# Patient Record
Sex: Male | Born: 1975
Health system: Southern US, Community
[De-identification: ages and names within clinical notes are randomized; demographics above are authoritative.]

## PROBLEM LIST (undated history)

## (undated) DIAGNOSIS — F32A Depression, unspecified: Secondary | ICD-10-CM

## (undated) DIAGNOSIS — E785 Hyperlipidemia, unspecified: Secondary | ICD-10-CM

## (undated) DIAGNOSIS — K219 Gastro-esophageal reflux disease without esophagitis: Secondary | ICD-10-CM

## (undated) DIAGNOSIS — G4733 Obstructive sleep apnea (adult) (pediatric): Secondary | ICD-10-CM

## (undated) DIAGNOSIS — F329 Major depressive disorder, single episode, unspecified: Secondary | ICD-10-CM

## (undated) DIAGNOSIS — R42 Dizziness and giddiness: Secondary | ICD-10-CM

## (undated) DIAGNOSIS — E1169 Type 2 diabetes mellitus with other specified complication: Secondary | ICD-10-CM

## (undated) DIAGNOSIS — I2510 Atherosclerotic heart disease of native coronary artery without angina pectoris: Secondary | ICD-10-CM

## (undated) DIAGNOSIS — G43909 Migraine, unspecified, not intractable, without status migrainosus: Secondary | ICD-10-CM

## (undated) DIAGNOSIS — E669 Obesity, unspecified: Secondary | ICD-10-CM

## (undated) DIAGNOSIS — G4730 Sleep apnea, unspecified: Secondary | ICD-10-CM

## (undated) DIAGNOSIS — F419 Anxiety disorder, unspecified: Secondary | ICD-10-CM

## (undated) HISTORY — DX: Major depressive disorder, single episode, unspecified: F32.9

## (undated) HISTORY — DX: Depression, unspecified: F32.A

## (undated) HISTORY — PX: COLONOSCOPY: SHX174

## (undated) HISTORY — DX: Anxiety disorder, unspecified: F41.9

## (undated) HISTORY — DX: Atherosclerotic heart disease of native coronary artery without angina pectoris: I25.10

## (undated) HISTORY — DX: Dizziness and giddiness: R42

## (undated) HISTORY — DX: Gastro-esophageal reflux disease without esophagitis: K21.9

## (undated) HISTORY — DX: Obstructive sleep apnea (adult) (pediatric): G47.33

## (undated) HISTORY — DX: Hyperlipidemia, unspecified: E78.5

## (undated) HISTORY — PX: FINGER SURGERY: SHX640

## (undated) HISTORY — DX: Sleep apnea, unspecified: G47.30

---

## 2009-01-01 ENCOUNTER — Ambulatory Visit: Payer: Self-pay | Admitting: Internal Medicine

## 2009-01-01 DIAGNOSIS — E785 Hyperlipidemia, unspecified: Secondary | ICD-10-CM | POA: Insufficient documentation

## 2009-01-01 DIAGNOSIS — K219 Gastro-esophageal reflux disease without esophagitis: Secondary | ICD-10-CM | POA: Insufficient documentation

## 2009-01-01 LAB — CONVERTED CEMR LAB
ALT: 28 units/L (ref 0–53)
AST: 22 units/L (ref 0–37)
Albumin: 4 g/dL (ref 3.5–5.2)
Alkaline Phosphatase: 72 units/L (ref 39–117)
BUN: 12 mg/dL (ref 6–23)
Bilirubin, Direct: 0.1 mg/dL (ref 0.0–0.3)
CO2: 31 meq/L (ref 19–32)
Calcium: 9.1 mg/dL (ref 8.4–10.5)
Chloride: 106 meq/L (ref 96–112)
Cholesterol, target level: 200 mg/dL
Cholesterol: 229 mg/dL — ABNORMAL HIGH (ref 0–200)
Creatinine, Ser: 1.3 mg/dL (ref 0.4–1.5)
Direct LDL: 148.9 mg/dL
GFR calc non Af Amer: 81.39 mL/min (ref >60)
Glucose, Bld: 90 mg/dL (ref 70–99)
HDL goal, serum: 40 mg/dL
HDL: 46.4 mg/dL (ref >39.00)
LDL Goal: 160 mg/dL
Potassium: 4.4 meq/L (ref 3.5–5.1)
Sodium: 142 meq/L (ref 135–145)
TSH: 0.97 microintl units/mL (ref 0.35–5.50)
Total Bilirubin: 0.7 mg/dL (ref 0.3–1.2)
Total CHOL/HDL Ratio: 5
Total Protein: 7.8 g/dL (ref 6.0–8.3)
Triglycerides: 143 mg/dL (ref 0.0–149.0)
VLDL: 28.6 mg/dL (ref 0.0–40.0)

## 2009-03-30 ENCOUNTER — Ambulatory Visit: Payer: Self-pay | Admitting: Internal Medicine

## 2009-03-30 DIAGNOSIS — M79609 Pain in unspecified limb: Secondary | ICD-10-CM | POA: Insufficient documentation

## 2010-05-13 ENCOUNTER — Ambulatory Visit: Payer: Self-pay | Admitting: Internal Medicine

## 2010-05-13 DIAGNOSIS — R5383 Other fatigue: Secondary | ICD-10-CM

## 2010-05-13 DIAGNOSIS — L738 Other specified follicular disorders: Secondary | ICD-10-CM | POA: Insufficient documentation

## 2010-05-13 DIAGNOSIS — G4733 Obstructive sleep apnea (adult) (pediatric): Secondary | ICD-10-CM | POA: Insufficient documentation

## 2010-05-13 DIAGNOSIS — R5381 Other malaise: Secondary | ICD-10-CM | POA: Insufficient documentation

## 2010-05-13 LAB — CONVERTED CEMR LAB
ALT: 54 units/L — ABNORMAL HIGH (ref 0–53)
AST: 36 units/L (ref 0–37)
Albumin: 4.3 g/dL (ref 3.5–5.2)
Alkaline Phosphatase: 58 units/L (ref 39–117)
BUN: 17 mg/dL (ref 6–23)
Basophils Absolute: 0 10*3/uL (ref 0.0–0.1)
Basophils Relative: 0.5 % (ref 0.0–3.0)
Bilirubin, Direct: 0.1 mg/dL (ref 0.0–0.3)
CO2: 29 meq/L (ref 19–32)
Calcium: 9.4 mg/dL (ref 8.4–10.5)
Chloride: 103 meq/L (ref 96–112)
Cholesterol: 254 mg/dL — ABNORMAL HIGH (ref 0–200)
Creatinine, Ser: 1.2 mg/dL (ref 0.4–1.5)
Direct LDL: 150.7 mg/dL
Eosinophils Absolute: 0.1 10*3/uL (ref 0.0–0.7)
Eosinophils Relative: 1 % (ref 0.0–5.0)
GFR calc non Af Amer: 85.27 mL/min (ref >60)
Glucose, Bld: 87 mg/dL (ref 70–99)
HCT: 43.3 % (ref 39.0–52.0)
HDL: 47.2 mg/dL (ref >39.00)
Hemoglobin: 15 g/dL (ref 13.0–17.0)
Lymphocytes Relative: 33.2 % (ref 12.0–46.0)
Lymphs Abs: 1.7 10*3/uL (ref 0.7–4.0)
MCHC: 34.6 g/dL (ref 30.0–36.0)
MCV: 86.9 fL (ref 78.0–100.0)
Monocytes Absolute: 0.4 10*3/uL (ref 0.1–1.0)
Monocytes Relative: 7.5 % (ref 3.0–12.0)
Neutro Abs: 2.9 10*3/uL (ref 1.4–7.7)
Neutrophils Relative %: 57.8 % (ref 43.0–77.0)
Platelets: 255 10*3/uL (ref 150.0–400.0)
Potassium: 4.4 meq/L (ref 3.5–5.1)
RBC: 4.99 M/uL (ref 4.22–5.81)
RDW: 13.3 % (ref 11.5–14.6)
Sodium: 140 meq/L (ref 135–145)
TSH: 1.55 microintl units/mL (ref 0.35–5.50)
Total Bilirubin: 0.6 mg/dL (ref 0.3–1.2)
Total CHOL/HDL Ratio: 5
Total Protein: 7.7 g/dL (ref 6.0–8.3)
Triglycerides: 140 mg/dL (ref 0.0–149.0)
VLDL: 28 mg/dL (ref 0.0–40.0)
WBC: 5 10*3/uL (ref 4.5–10.5)

## 2010-05-14 ENCOUNTER — Encounter: Payer: Self-pay | Admitting: Internal Medicine

## 2010-05-14 ENCOUNTER — Encounter (INDEPENDENT_AMBULATORY_CARE_PROVIDER_SITE_OTHER): Payer: Self-pay | Admitting: *Deleted

## 2010-06-03 ENCOUNTER — Ambulatory Visit: Payer: Self-pay | Admitting: Pulmonary Disease

## 2010-06-03 DIAGNOSIS — R002 Palpitations: Secondary | ICD-10-CM | POA: Insufficient documentation

## 2010-06-25 ENCOUNTER — Telehealth: Payer: Self-pay | Admitting: Pulmonary Disease

## 2010-06-26 ENCOUNTER — Encounter: Payer: Self-pay | Admitting: Pulmonary Disease

## 2010-06-27 ENCOUNTER — Encounter: Payer: Self-pay | Admitting: Internal Medicine

## 2010-06-27 ENCOUNTER — Ambulatory Visit
Admission: RE | Admit: 2010-06-27 | Discharge: 2010-06-27 | Payer: Self-pay | Source: Home / Self Care | Attending: Pulmonary Disease | Admitting: Pulmonary Disease

## 2010-06-27 ENCOUNTER — Ambulatory Visit
Admission: RE | Admit: 2010-06-27 | Discharge: 2010-06-27 | Payer: Self-pay | Source: Home / Self Care | Attending: Internal Medicine | Admitting: Internal Medicine

## 2010-06-27 DIAGNOSIS — S6990XA Unspecified injury of unspecified wrist, hand and finger(s), initial encounter: Secondary | ICD-10-CM | POA: Insufficient documentation

## 2010-06-27 DIAGNOSIS — S59909A Unspecified injury of unspecified elbow, initial encounter: Secondary | ICD-10-CM | POA: Insufficient documentation

## 2010-06-27 DIAGNOSIS — S63509A Unspecified sprain of unspecified wrist, initial encounter: Secondary | ICD-10-CM | POA: Insufficient documentation

## 2010-06-27 DIAGNOSIS — S59919A Unspecified injury of unspecified forearm, initial encounter: Secondary | ICD-10-CM

## 2010-06-27 DIAGNOSIS — J020 Streptococcal pharyngitis: Secondary | ICD-10-CM | POA: Insufficient documentation

## 2010-07-04 ENCOUNTER — Ambulatory Visit
Admission: RE | Admit: 2010-07-04 | Discharge: 2010-07-04 | Payer: Self-pay | Source: Home / Self Care | Attending: Pulmonary Disease | Admitting: Pulmonary Disease

## 2010-07-23 NOTE — Assessment & Plan Note (Signed)
Summary: NEW / Howard Hays /$50 Natale Milch   Vital Signs:  Patient profile:   35 year old male Height:      72 inches Weight:      234 pounds BMI:     31.85 O2 Sat:      98 % on Room air Temp:     98.5 degrees F oral Pulse rate:   72 / minute Pulse rhythm:   regular BP sitting:   110 / 74  (left arm) Cuff size:   large  Vitals Entered By: Rock Nephew CMA (January 01, 2009 10:29 AM)  Nutrition Counseling: Patient's BMI is greater than 25 and therefore counseled on weight management options.  O2 Flow:  Room air CC: new to establish, Abdominal Pain, Lipid Management   Primary Care Provider:  Etta Grandchild MD  CC:  new to establish, Abdominal Pain, and Lipid Management.  History of Present Illness: New to me wants a f/up on high cholesterol.  Dyspepsia History:      He has no alarm features of dyspepsia including no history of melena, hematochezia, dysphagia, persistent vomiting, or involuntary weight loss > 5%.  There is a prior history of GERD.  The patient does not have a prior history of documented ulcer disease.  The dominant symptom is heartburn or acid reflux.  An H-2 blocker medication is currently being taken.  He notes that the symptoms have improved with the H-2 blocker therapy.  Symptoms have not persisted after 4 weeks of H-2 blocker treatment.  A prior EGD has been done which showed no evidence for moderate or severe esophagitis or Barrett's esophagus.    Lipid Management History:      Negative NCEP/ATP III risk factors include male age less than 29 years old, non-diabetic, no family history for ischemic heart disease, non-tobacco-Kassius Battiste status, non-hypertensive, no ASHD (atherosclerotic heart disease), no prior stroke/TIA, no peripheral vascular disease, and no history of aortic aneurysm.        The patient states that he knows about the "Therapeutic Lifestyle Change" diet.  His compliance with the TLC diet is fair.  The patient expresses understanding of adjunctive measures for  cholesterol lowering.  Adjunctive measures started by the patient include aerobic exercise, fiber, ASA, limit alcohol consumpton, and weight reduction.  He expresses no side effects from his lipid-lowering medication.  The patient denies any symptoms to suggest myopathy or liver disease.      Preventive Screening-Counseling & Management  Alcohol-Tobacco     Smoking Status: never  Current Medications (verified): 1)  Simvastatin 40 Mg Tabs (Simvastatin) .... Take 1 Tab By Mouth At Bedtime 2)  Aspir-Low 81 Mg Tbec (Aspirin) .... Once Daily 3)  Zantac 150 Mg Tabs (Ranitidine Hcl) .... Once Daily  Allergies (verified): No Known Drug Allergies  Past History:  Past Medical History: GERD Hyperlipidemia  Past Surgical History: Denies surgical history  Social History: Smoking Status:  never  Review of Systems  The patient denies anorexia, fever, weight loss, abdominal pain, melena, hematochezia, severe indigestion/heartburn, and testicular masses.    Physical Exam  General:  Well-developed,well-nourished,in no acute distress; alert,appropriate and cooperative throughout examinationoverweight-appearing.   Mouth:  Oral mucosa and oropharynx without lesions or exudates.  Teeth in good repair. Neck:  supple, full ROM, no masses, no carotid bruits, no cervical lymphadenopathy, and no neck tenderness.   Lungs:  Normal respiratory effort, chest expands symmetrically. Lungs are clear to auscultation, no crackles or wheezes. Heart:  Normal rate and regular rhythm. S1 and  S2 normal without gallop, murmur, click, rub or other extra sounds. Abdomen:  soft, non-tender, normal bowel sounds, no distention, no masses, no guarding, no rigidity, no rebound tenderness, no abdominal hernia, no inguinal hernia, no hepatomegaly, and no splenomegaly.   Genitalia:  Testes bilaterally descended without nodularity, tenderness or masses. No scrotal masses or lesions. No penis lesions or urethral discharge. Msk:   No deformity or scoliosis noted of thoracic or lumbar spine.   Pulses:  R and L carotid,radial,femoral,dorsalis pedis and posterior tibial pulses are full and equal bilaterally Extremities:  No clubbing, cyanosis, edema, or deformity noted with normal full range of motion of all joints.   Neurologic:  No cranial nerve deficits noted. Station and gait are normal. Plantar reflexes are down-going bilaterally. DTRs are symmetrical throughout. Sensory, motor and coordinative functions appear intact. Skin:  Intact without suspicious lesions or rashes Cervical Nodes:  No lymphadenopathy noted Axillary Nodes:  No palpable lymphadenopathy Inguinal Nodes:  No significant adenopathy Psych:  Cognition and judgment appear intact. Alert and cooperative with normal attention span and concentration. No apparent delusions, illusions, hallucinations   Impression & Recommendations:  Problem # 1:  HYPERLIPIDEMIA (ICD-272.4) Assessment Unchanged  His updated medication list for this problem includes:    Simvastatin 40 Mg Tabs (Simvastatin) .Marland Kitchen... Take 1 tab by mouth at bedtime  Orders: Venipuncture ([address]) TLB-Lipid Panel (80061-LIPID) TLB-BMP (Basic Metabolic Panel-BMET) (80048-METABOL) TLB-Hepatic/Liver Function Pnl (80076-HEPATIC) TLB-TSH (Thyroid Stimulating Hormone) (84443-TSH)  Problem # 2:  GERD (ICD-530.81) Assessment: Improved  His updated medication list for this problem includes:    Zantac 150 Mg Tabs (Ranitidine hcl) ..... Once daily  Orders: Venipuncture ([address]) TLB-Lipid Panel (80061-LIPID) TLB-BMP (Basic Metabolic Panel-BMET) (80048-METABOL) TLB-Hepatic/Liver Function Pnl (80076-HEPATIC) TLB-TSH (Thyroid Stimulating Hormone) (84443-TSH)  Complete Medication List: 1)  Simvastatin 40 Mg Tabs (Simvastatin) .... Take 1 tab by mouth at bedtime 2)  Aspir-low 81 Mg Tbec (Aspirin) .... Once daily 3)  Zantac 150 Mg Tabs (Ranitidine hcl) .... Once daily  Dyspepsia Assessment/Plan:  Step  Therapy: GERD Treatment Protocols:    Step-1: started    H-2 blocker chosen: Ranitidine 150mg  by mouth at bedtime  Lipid Assessment/Plan:      Based on NCEP/ATP III, the patient's risk factor category is "0-1 risk factors".  The patient's lipid goals are as follows: Total cholesterol goal is 200; LDL cholesterol goal is 160; HDL cholesterol goal is 40; Triglyceride goal is 150.    Patient Instructions: 1)  Please schedule a follow-up appointment as needed. 2)  It is important that you exercise regularly at least 20 minutes 5 times a week. If you develop chest pain, have severe difficulty breathing, or feel very tired , stop exercising immediately and seek medical attention. 3)  You need to lose weight. Consider a lower calorie diet and regular exercise.  Prescriptions: ZANTAC 150 MG TABS (RANITIDINE HCL) once daily  #0 x 0   Entered and Authorized by:   Etta Grandchild MD   Signed by:   Etta Grandchild MD on 01/01/2009   Method used:   Historical   RxID:   [id number] ASPIR-LOW 81 MG TBEC (ASPIRIN) once daily  #0 x 0   Entered and Authorized by:   Etta Grandchild MD   Signed by:   Etta Grandchild MD on 01/01/2009   Method used:   Historical   RxID:   [id number] SIMVASTATIN 40 MG TABS (SIMVASTATIN) Take 1 tab by mouth at bedtime  #30 x 11   Entered  and Authorized by:   Etta Grandchild MD   Signed by:   Etta Grandchild MD on 01/01/2009   Method used:   Electronically to        [facility]. [phone]* (retail)       [address]       [place], [place]  [address]       Ph: [phone]       Fax: [phone]   RxID:   [id number]

## 2010-07-23 NOTE — Letter (Signed)
Summary: Results Follow-up Letter  [facility]  [address] [place], [place] [address]   Phone: [phone]  Fax: [phone]    01/01/2009  [address] [place], [place]  [address]  Dear Mr. MALINAK,   The following are the results of your recent test(s):  Test     Result     Liver/kidney   normal Thyroid     normal   ___________________________________    _________________________________________________________  Please call for an appointment as needed _________________________________________________________ _________________________________________________________ _________________________________________________________  Sincerely,  Sanda Linger MD  [facility]

## 2010-07-23 NOTE — Assessment & Plan Note (Signed)
Summary: f/u appt,ring finger pain/#/cd   Vital Signs:  Patient profile:   35 year old male Height:      73 inches Weight:      243 pounds O2 Sat:      98 % Temp:     98.7 degrees F Pulse rate:   74 / minute Pulse rhythm:   regular Resp:     16 per minute BP sitting:   130 / 80  Vitals Entered By: Lamar Sprinkles, CMA (March 30, 2009 8:09 AM) CC: Needs flu & tdap / C/o pain on the tip of his left ring finger, Abdominal Pain, Lipid Management   Primary Care Provider:  Etta Grandchild MD  CC:  Needs flu & tdap / C/o pain on the tip of his left ring finger, Abdominal Pain, and Lipid Management.  History of Present Illness: He returns with a c/o pain in his left ring finger for about one year. It hurts over the distal phalanx when he touches it.  Dyspepsia History:      He has no alarm features of dyspepsia including no history of melena, hematochezia, dysphagia, persistent vomiting, or involuntary weight loss > 5%.  There is a prior history of GERD.  The patient does not have a prior history of documented ulcer disease.  The dominant symptom is heartburn or acid reflux.  An H-2 blocker medication is currently being taken.  He notes that the symptoms have not improved with the H-2 blocker therapy.  Symptoms have persisted after 4 weeks of H-2 blocker treatment.    Lipid Management History:      Negative NCEP/ATP III risk factors include male age less than 51 years old, non-diabetic, no family history for ischemic heart disease, non-tobacco-user status, non-hypertensive, no ASHD (atherosclerotic heart disease), no prior stroke/TIA, no peripheral vascular disease, and no history of aortic aneurysm.        The patient states that he knows about the "Therapeutic Lifestyle Change" diet.  His compliance with the TLC diet is fair.  The patient does not know about adjunctive measures for cholesterol lowering.  Adjunctive measures started by the patient include aerobic exercise, fiber, limit  alcohol consumpton, and weight reduction.  He expresses no side effects from his lipid-lowering medication.  The patient denies any symptoms to suggest myopathy or liver disease.      Preventive Screening-Counseling & Management  Alcohol-Tobacco     Smoking Status: never  Caffeine-Diet-Exercise     Does Patient Exercise: yes      Drug Use:  no.    Current Medications (verified): 1)  Simvastatin 40 Mg Tabs (Simvastatin) .... Take 1 Tab By Mouth At Bedtime 2)  Aspir-Low 81 Mg Tbec (Aspirin) .... Once Daily 3)  Zantac 150 Mg Tabs (Ranitidine Hcl) .... Once Daily  Allergies (verified): No Known Drug Allergies  Past History:  Past Medical History: Reviewed history from 01/01/2009 and no changes required. GERD Hyperlipidemia  Past Surgical History: Reviewed history from 01/01/2009 and no changes required. Denies surgical history  Family History: Family History High cholesterol  Social History: Reviewed history and no changes required. Occupation: Married Never Smoked Alcohol use-no Drug use-no Regular exercise-yes Drug Use:  no Does Patient Exercise:  yes  Review of Systems  The patient denies anorexia, fever, chest pain, abdominal pain, melena, hematochezia, severe indigestion/heartburn, hematuria, enlarged lymph nodes, and angioedema.    Physical Exam  General:  alert, well-developed, well-nourished, well-hydrated, cooperative to examination, and good hygiene.   Head:  normocephalic and atraumatic.   Mouth:  Oral mucosa and oropharynx without lesions or exudates.  Teeth in good repair. Neck:  supple, full ROM, no masses, no carotid bruits, no cervical lymphadenopathy, and no neck tenderness.   Lungs:  Normal respiratory effort, chest expands symmetrically. Lungs are clear to auscultation, no crackles or wheezes. Heart:  Normal rate and regular rhythm. S1 and S2 normal without gallop, murmur, click, rub or other extra sounds. Abdomen:  Bowel sounds  positive,abdomen soft and non-tender without masses, organomegaly or hernias noted. Msk:   left ring finger: normal ROM, no joint swelling, no joint warmth, no redness over joints, no joint deformities, no joint instability, no crepitation, and joint tenderness diffusely over the distal phalanx..   Pulses:  R and L carotid,radial,femoral,dorsalis pedis and posterior tibial pulses are full and equal bilaterally Extremities:  No clubbing, cyanosis, edema, or deformity noted with normal full range of motion of all joints.   Neurologic:  No cranial nerve deficits noted. Station and gait are normal. Plantar reflexes are down-going bilaterally. DTRs are symmetrical throughout. Sensory, motor and coordinative functions appear intact. Skin:  turgor normal, color normal, no rashes, no suspicious lesions, no ecchymoses, and no petechiae.   Psych:  Cognition and judgment appear intact. Alert and cooperative with normal attention span and concentration. No apparent delusions, illusions, hallucinations   Impression & Recommendations:  Problem # 1:  FINGER PAIN (ICD-729.5) Assessment New  Orders: Orthopedic Referral (Ortho) T-Finger(s) (73140TC)  Problem # 2:  GERD (ICD-530.81) Assessment: Deteriorated  The following medications were removed from the medication list:    Zantac 150 Mg Tabs (Ranitidine hcl) ..... Once daily His updated medication list for this problem includes:    Prilosec Otc 20 Mg Tbec (Omeprazole magnesium) ..... Once daily for heartburn  Complete Medication List: 1)  Simvastatin 40 Mg Tabs (Simvastatin) .... Take 1 tab by mouth at bedtime 2)  Aspir-low 81 Mg Tbec (Aspirin) .... Once daily 3)  Prilosec Otc 20 Mg Tbec (Omeprazole magnesium) .... Once daily for heartburn  Other Orders: Admin 1st Vaccine (95621) Flu Vaccine 54yrs + (30865) Tdap => 48yrs IM (78469) Admin of Any Addtl Vaccine (62952)  Dyspepsia Assessment/Plan:  Step Therapy: GERD Treatment Protocols:    Step-1:  started  Lipid Assessment/Plan:      Based on NCEP/ATP III, the patient's risk factor category is "0-1 risk factors".  The patient's lipid goals are as follows: Total cholesterol goal is 200; LDL cholesterol goal is 160; HDL cholesterol goal is 40; Triglyceride goal is 150.    Patient Instructions: 1)  Please schedule a follow-up appointment in 2 months. 2)  Avoid foods high in acid (tomatoes, citrus juices, spicy foods). Avoid eating within two hours of lying down or before exercising. Do not over eat; try smaller more frequent meals. Elevate head of bed twelve inches when sleeping. Prescriptions: PRILOSEC OTC 20 MG TBEC (OMEPRAZOLE MAGNESIUM) once daily for heartburn  #10 x 0   Entered and Authorized by:   Etta Grandchild MD   Signed by:   Etta Grandchild MD on 03/30/2009   Method used:   Samples Given   RxID:   [id number]  Flu Vaccine Consent Questions     Do you have a history of severe allergic reactions to this vaccine? no    Any prior history of allergic reactions to egg and/or gelatin? no    Do you have a sensitivity to the preservative Thimersol? no    Do you have a  past history of Guillan-Barre Syndrome? no    Do you currently have an acute febrile illness? no    Have you ever had a severe reaction to latex? no    Vaccine information given and explained to patient? yes    Are you currently pregnant? no    Lot Number:[id number]   Exp Date:12/20/2009   Site Given Right  Deltoid IM  Immunizations Administered:  Tetanus Vaccine:    Vaccine Type: Tdap    Site: left deltoid    Mfr: GlaxoSmithKline    Dose: 0.5 ml    Route: IM    Given by: Rock Nephew CMA    Exp. Date: 01/06/2011    Lot #: [id number]    VIS given: 05/11/07 version given March 30, 2009.  Marland Kitchenlbflu

## 2010-07-23 NOTE — Letter (Signed)
Summary: Lipid Letter  [facility]  [address] [place], [place] [address]   Phone: [phone]  Fax: [phone]    01/01/2009  Howard Hays [address] [place], [place]  [address]  Dear Howard Hays:  We have carefully reviewed your last lipid profile from  and the results are noted below with a summary of recommendations for lipid management.    Cholesterol:       229     Goal: <200   HDL "good" Cholesterol:   57.84     Goal: >40   LDL "bad" Cholesterol:   149     Goal: <160   Triglycerides:       143.0     Goal: <150    acceptable    TLC Diet (Therapeutic Lifestyle Change): Saturated Fats & Transfatty acids should be kept < 7% of total calories ***Reduce Saturated Fats Polyunstaurated Fat can be up to 10% of total calories Monounsaturated Fat Fat can be up to 20% of total calories Total Fat should be no greater than 25-35% of total calories Carbohydrates should be 50-60% of total calories Protein should be approximately 15% of total calories Fiber should be at least 20-30 grams a day ***Increased fiber may help lower LDL Total Cholesterol should be < 200mg /day Consider adding plant stanol/sterols to diet (example: Benacol spread) ***A higher intake of unsaturated fat may reduce Triglycerides and Increase HDL    Adjunctive Measures (may lower LIPIDS and reduce risk of Heart Attack) include: Aerobic Exercise (20-30 minutes 3-4 times a week) Limit Alcohol Consumption Weight Reduction Aspirin 75-81 mg a day by mouth (if not allergic or contraindicated) Dietary Fiber 20-30 grams a day by mouth     Current Medications: 1)    Simvastatin 40 Mg Tabs (Simvastatin) .... Take 1 tab by mouth at bedtime 2)    Aspir-low 81 Mg Tbec (Aspirin) .... Once daily 3)    Zantac 150 Mg Tabs (Ranitidine hcl) .... Once daily  If you have any questions, please call. We appreciate being able to work with you.   Sincerely,    [facility] Etta Grandchild MD

## 2010-07-23 NOTE — Assessment & Plan Note (Signed)
Summary: fatigue/few other issues/#/cd   Vital Signs:  Patient profile:   35 year old male Height:      73 inches Weight:      253 pounds BMI:     33.50 O2 Sat:      96 % on Room air Temp:     98.3 degrees F oral Pulse rate:   82 / minute Pulse rhythm:   regular Resp:     16 per minute BP sitting:   112 / 78  (left arm) Cuff size:   large  Vitals Entered By: Bill Salinas CMA (May 13, 2010 2:20 PM)  Nutrition Counseling: Patient's BMI is greater than 25 and therefore counseled on weight management options.  O2 Flow:  Room air CC: pt here with c/o fatigue x 1 month/ ab, Fatigue, Lipid Management Is Patient Diabetic? No Pain Assessment Patient in pain? no        Primary Care Provider:  Etta Grandchild MD  CC:  pt here with c/o fatigue x 1 month/ ab, Fatigue, and Lipid Management.  History of Present Illness:  Fatigue      This is a 35 year old man who presents with Fatigue.  The symptoms began 4-8 weeks ago.  The severity is described as moderate.  The patient reports persistent fatigue and primarily physical fatigue, but denies primarily motivational fatigue.  The patient denies fever, night sweats, weight loss, exertional chest pain, dyspnea, cough, hemoptysis, and new medications.  Other symptoms include severe snoring, daytime sleepiness, and skin changes.  The patient denies the following symptoms: leg swelling, orthopnea, PND, melena, and adenopathy.  The patient denies anhedonia, feeling depressed, altered appetite, and poor sleep.    He wants to switch simvastatin to lipitor b/c lipitor is generic and affordable now.  He complains of dry feet "all of his life".  Lipid Management History:      Negative NCEP/ATP III risk factors include male age less than 95 years old, non-diabetic, no family history for ischemic heart disease, non-tobacco-user status, non-hypertensive, no ASHD (atherosclerotic heart disease), no prior stroke/TIA, no peripheral vascular disease,  and no history of aortic aneurysm.        The patient states that he knows about the "Therapeutic Lifestyle Change" diet.  His compliance with the TLC diet is fair.  The patient does not know about adjunctive measures for cholesterol lowering.  Adjunctive measures started by the patient include fiber, ASA, and limit alcohol consumpton.  He expresses no side effects from his lipid-lowering medication.     Preventive Screening-Counseling & Management  Alcohol-Tobacco     Alcohol drinks/day: 0     Alcohol Counseling: not indicated; patient does not drink     Smoking Status: never     Tobacco Counseling: not indicated; no tobacco use  Hep-HIV-STD-Contraception     Hepatitis Risk: no risk noted     HIV Risk: no risk noted     STD Risk: no risk noted      Sexual History:  currently monogamous.        Drug Use:  no.        Blood Transfusions:  no.    Medications Prior to Update: 1)  Simvastatin 40 Mg Tabs (Simvastatin) .... Take 1 Tab By Mouth At Bedtime 2)  Aspir-Low 81 Mg Tbec (Aspirin) .... Once Daily 3)  Prilosec Otc 20 Mg Tbec (Omeprazole Magnesium) .... Once Daily For Heartburn  Current Medications (verified): 1)  Aspir-Low 81 Mg Tbec (Aspirin) .... Once  Daily 2)  Prilosec Otc 20 Mg Tbec (Omeprazole Magnesium) .... Once Daily For Heartburn 3)  Lipitor 40 Mg Tabs (Atorvastatin Calcium) .... One By Mouth Once Daily 4)  Ammonium Lactate 12 % Crea (Ammonium Lactate) .... Apply To Feet Once Daily  Allergies (verified): No Known Drug Allergies  Past History:  Past Medical History: Last updated: 01/01/2009 GERD Hyperlipidemia  Past Surgical History: Last updated: 01/01/2009 Denies surgical history  Family History: Last updated: 03/30/2009 Family History High cholesterol  Social History: Last updated: 05/13/2010 Occupation: Theatre manager. Married Never Smoked Alcohol use-no Drug use-no Regular exercise-yes  Risk Factors: Alcohol Use: 0 (05/13/2010) Exercise: yes  (03/30/2009)  Risk Factors: Smoking Status: never (05/13/2010)  Family History: Reviewed history from 03/30/2009 and no changes required. Family History High cholesterol  Social History: Reviewed history from 03/30/2009 and no changes required. 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  The patient denies anorexia, fever, weight loss, chest pain, syncope, dyspnea on exertion, peripheral edema, prolonged cough, headaches, hemoptysis, abdominal pain, hematuria, suspicious skin lesions, difficulty walking, depression, enlarged lymph nodes, and angioedema.   Resp:  Complains of excessive snoring and hypersomnolence; denies chest discomfort, chest pain with inspiration, cough, coughing up blood, morning headaches, pleuritic, shortness of breath, sputum productive, and wheezing. Derm:  Complains of dryness; denies changes in color of skin, changes in nail beds, excessive perspiration, flushing, hair loss, insect bite(s), itching, lesion(s), poor wound healing, and rash. Endo:  Complains of weight change; denies cold intolerance, excessive hunger, excessive thirst, excessive urination, heat intolerance, and polyuria.  Physical Exam  General:  alert, well-developed, well-nourished, well-hydrated, appropriate dress, healthy-appearing, cooperative to examination, good hygiene, and overweight-appearing.   Head:  normocephalic, atraumatic, no abnormalities observed, and no abnormalities palpated.   Eyes:  No corneal or conjunctival inflammation noted. EOMI. Perrla. Funduscopic exam benign, without hemorrhages, exudates or papilledema. Vision grossly normal. Mouth:  Oral mucosa and oropharynx without lesions or exudates.  Teeth in good  repair. Neck:  supple, full ROM, no masses, no thyromegaly, no thyroid nodules or tenderness, no JVD, normal carotid upstroke, no carotid bruits, no cervical lymphadenopathy, and no neck tenderness.   Lungs:  normal respiratory effort, no intercostal retractions, no accessory muscle use, normal breath sounds, no dullness, no fremitus, no crackles, and no wheezes.   Heart:  normal rate, regular rhythm, no murmur, no gallop, no rub, and no JVD.   Abdomen:  soft, non-tender, normal bowel sounds, no distention, no masses, no guarding, no rigidity, no rebound tenderness, no abdominal hernia, no inguinal hernia, no hepatomegaly, and no splenomegaly.   Msk:  normal ROM, no joint tenderness, no joint swelling, no joint warmth, no redness over joints, no joint deformities, no joint instability, no crepitation, and no muscle atrophy.   Pulses:  R and L carotid,radial,femoral,dorsalis pedis and posterior tibial pulses are full and equal bilaterally Extremities:  No clubbing, cyanosis, edema, or deformity noted with normal full range of motion of all joints.   Neurologic:  No cranial nerve deficits noted. Station and gait are normal. Plantar reflexes are down-going bilaterally. DTRs are symmetrical throughout. Sensory, motor and coordinative functions appear intact. Skin:  turgor normal, color normal, no rashes, no suspicious lesions, no ecchymoses, no petechiae,  no purpura, no ulcerations, and no edema.  his feet are dry but there is no scale, papules, fissuring, erythema, or exudate. Cervical Nodes:  no anterior cervical adenopathy and no posterior cervical adenopathy.   Axillary Nodes:  no R axillary adenopathy and no L axillary adenopathy.   Inguinal Nodes:  no R inguinal adenopathy and no L inguinal adenopathy.   Psych:  Cognition and judgment appear intact. Alert and cooperative with normal attention span and concentration. No apparent delusions, illusions, hallucinations   Impression &  Recommendations:  Problem # 1:  FATIGUE (ICD-780.79) Assessment New will check for systemic illness but I think this is due to a sleep disorder Orders: Venipuncture (16109) TLB-BMP (Basic Metabolic Panel-BMET) (80048-METABOL) TLB-CBC Platelet - w/Differential (85025-CBCD) TLB-Hepatic/Liver Function Pnl (80076-HEPATIC) TLB-TSH (Thyroid Stimulating Hormone) (84443-TSH) TLB-Lipid Panel (80061-LIPID)  Problem # 2:  SNORING (ICD-786.09) Assessment: New will see about getting a sleep study done Orders: Venipuncture ([address]) TLB-BMP (Basic Metabolic Panel-BMET) (80048-METABOL) TLB-CBC Platelet - w/Differential (85025-CBCD) TLB-Hepatic/Liver Function Pnl (80076-HEPATIC) TLB-TSH (Thyroid Stimulating Hormone) (84443-TSH) TLB-Lipid Panel (80061-LIPID) Sleep Disorder Referral (Sleep Disorder)  Problem # 3:  XEROSIS, SKIN OF FOOT (ICD-706.8) Assessment: New will recommend LacHydrin Orders: Venipuncture (09811) TLB-BMP (Basic Metabolic Panel-BMET) (80048-METABOL) TLB-CBC Platelet - w/Differential (85025-CBCD) TLB-Hepatic/Liver Function Pnl (80076-HEPATIC) TLB-TSH (Thyroid Stimulating Hormone) (84443-TSH) TLB-Lipid Panel (80061-LIPID)  Problem # 4:  HYPERLIPIDEMIA (ICD-272.4) Assessment: Unchanged  The following medications were removed from the medication list:    Simvastatin 40 Mg Tabs (Simvastatin) .Marland Kitchen... Take 1 tab by mouth at bedtime His updated medication list for this problem includes:    Lipitor 40 Mg Tabs (Atorvastatin calcium) ..... One by mouth once daily  Orders: Venipuncture (91478) TLB-BMP (Basic Metabolic Panel-BMET) (80048-METABOL) TLB-CBC Platelet - w/Differential (85025-CBCD) TLB-Hepatic/Liver Function Pnl (80076-HEPATIC) TLB-TSH (Thyroid Stimulating Hormone) (84443-TSH) TLB-Lipid Panel (80061-LIPID)  Labs Reviewed: SGOT: 22 (01/01/2009)   SGPT: 28 (01/01/2009)  Lipid Goals: Chol Goal: 200 (01/01/2009)   HDL Goal: 40 (01/01/2009)   LDL Goal: 160 (01/01/2009)    TG Goal: 150 (01/01/2009)  Prior 10 Yr Risk Heart Disease: Not enough information (01/01/2009)   HDL:46.40 (01/01/2009)  Chol:229 (01/01/2009)  Trig:143.0 (01/01/2009)  Complete Medication List: 1)  Aspir-low 81 Mg Tbec (Aspirin) .... Once daily 2)  Prilosec Otc 20 Mg Tbec (Omeprazole magnesium) .... Once daily for heartburn 3)  Lipitor 40 Mg Tabs (Atorvastatin calcium) .... One by mouth once daily 4)  Ammonium Lactate 12 % Crea (Ammonium lactate) .... Apply to feet once daily  Lipid Assessment/Plan:      Based on NCEP/ATP III, the patient's risk factor category is "0-1 risk factors".  The patient's lipid goals are as follows: Total cholesterol goal is 200; LDL cholesterol goal is 160; HDL cholesterol goal is 40; Triglyceride goal is 150.    Patient Instructions: 1)  Please schedule a follow-up appointment in 2 months. 2)  It is important that you exercise regularly at least 20 minutes 5 times a week. If you develop chest pain, have severe difficulty breathing, or feel very tired , stop exercising immediately and seek medical attention. 3)  You need to lose weight. Consider a lower calorie diet and regular exercise.  Prescriptions: AMMONIUM LACTATE 12 % CREA (AMMONIUM LACTATE) apply to feet once daily  #1 bottle x 11   Entered and Authorized by:   Etta Grandchild MD   Signed by:   Etta Grandchild MD on 05/13/2010   Method used:   Electronically to  [facility] [address] [phone]* (retail)       [address]       [place], [place]  [address]       Ph: [phone]       Fax: [phone]   RxID:   [id number] LIPITOR 40 MG TABS (ATORVASTATIN CALCIUM) One by mouth once daily  #30 x 11   Entered and Authorized by:   Etta Grandchild MD   Signed by:   Etta Grandchild MD on 05/13/2010   Method used:   Electronically to        [facility] [phone]* (retail)       [address]       [place], [place]  [address]       Ph:  [phone]       Fax: [phone]   RxID:   [id number]    Orders Added: 1)  Venipuncture [[address]010] 2)  TLB-BMP (Basic Metabolic Panel-BMET) [80048-METABOL] 3)  TLB-CBC Platelet - w/Differential [85025-CBCD] 4)  TLB-Hepatic/Liver Function Pnl [80076-HEPATIC] 5)  TLB-TSH (Thyroid Stimulating Hormone) [84443-TSH] 6)  TLB-Lipid Panel [80061-LIPID] 7)  Sleep Disorder Referral [Sleep Disorder] 8)  Est. Patient Level V [93235]

## 2010-07-23 NOTE — Letter (Signed)
Summary: Lipid Letter  [facility]  [address] [place], [place] [address]   Phone: [phone]  Fax: [phone]    05/14/2010  Howard Hays [address] [place], [place]  [address]  Dear Howard Hays:  We have carefully reviewed your last lipid profile from  and the results are noted below with a summary of recommendations for lipid management.    Cholesterol:       254     Goal: <200   HDL "good" Cholesterol:   43.32     Goal: >40   LDL "bad" Cholesterol:   151     Goal: <160   Triglycerides:       140.0     Goal: <150        TLC Diet (Therapeutic Lifestyle Change): Saturated Fats & Transfatty acids should be kept < 7% of total calories ***Reduce Saturated Fats Polyunstaurated Fat can be up to 10% of total calories Monounsaturated Fat Fat can be up to 20% of total calories Total Fat should be no greater than 25-35% of total calories Carbohydrates should be 50-60% of total calories Protein should be approximately 15% of total calories Fiber should be at least 20-30 grams a day ***Increased fiber may help lower LDL Total Cholesterol should be < 200mg /day Consider adding plant stanol/sterols to diet (example: Benacol spread) ***A higher intake of unsaturated fat may reduce Triglycerides and Increase HDL    Adjunctive Measures (may lower LIPIDS and reduce risk of Heart Attack) include: Aerobic Exercise (20-30 minutes 3-4 times a week) Limit Alcohol Consumption Weight Reduction Aspirin 75-81 mg a day by mouth (if not allergic or contraindicated) Dietary Fiber 20-30 grams a day by mouth     Current Medications: 1)    Aspir-low 81 Mg Tbec (Aspirin) .... Once daily 2)    Prilosec Otc 20 Mg Tbec (Omeprazole magnesium) .... Once daily for heartburn 3)    Lipitor 40 Mg Tabs (Atorvastatin calcium) .... One by mouth once daily 4)    Ammonium Lactate 12 % Crea (Ammonium lactate) .... Apply to feet once daily  If you have any questions, please call. We appreciate  being able to work with you.   Sincerely,    [facility] Etta Grandchild MD

## 2010-07-25 NOTE — Assessment & Plan Note (Signed)
Summary: Howard Hays ONLY/CB   Copy to:  Dr. Sanda Linger Primary Provider/Referring Provider:  Etta Grandchild MD   History of Present Illness: 35 yo male with Obstructive sleep apnea.  Apnealink report from 06/26/10: AHI 43, SpO2 low 83%.    Allergies: No Known Drug Allergies  Past History:  Past Medical History: GERD Hyperlipidemia Obstructive sleep apnea      - Apnealink 06/26/10 AHI 43   Impression & Recommendations:  Problem # 1:  OBSTRUCTIVE SLEEP APNEA (ICD-327.23) He has severe OSA.  Will schedule ROV to discuss test results and treatment options.  Complete Medication List: 1)  Aspir-low 81 Mg Tbec (Aspirin) .... Once daily 2)  Lipitor 40 Mg Tabs (Atorvastatin calcium) .... One by mouth once daily 3)  Amoxicillin 500 Mg Tabs (Amoxicillin) .... One by mouth three times a day for 10 days 4)  Naproxen 375 Mg Tabs (Naproxen) .... One by mouth two times a day as needed for wrist pain  Other Orders: Sleep Std Airflow/Heartrate and O2 SAT unattended (40981)

## 2010-07-25 NOTE — Assessment & Plan Note (Signed)
Summary: f/u apnealink/LC   Visit Type:  Follow-up Copy to:  Dr. Sanda Linger Primary Provider/Referring Provider:  Etta Grandchild MD  CC:  Apnealink follow-up.  History of Present Illness: 35 yo male with OSA.  Apnealink report from 06/26/10: AHI 43, SpO2 low 83%.  He continues to have similar sleep problems as before.  Preventive Screening-Counseling & Management  Alcohol-Tobacco     Alcohol drinks/day: <1     Alcohol Counseling: not indicated; patient does not drink     Smoking Status: quit     Packs/Day: 0.5     Year Started: 1994     Year Quit: 2007     Tobacco Counseling: not indicated; no tobacco use  Current Medications (verified): 1)  Aspir-Low 81 Mg Tbec (Aspirin) .... Once Daily 2)  Lipitor 40 Mg Tabs (Atorvastatin Calcium) .... One By Mouth Once Daily  Allergies (verified): No Known Drug Allergies  Past History:  Past Medical History: Last updated: 06/27/2010 GERD Hyperlipidemia Obstructive sleep apnea      - Apnealink 06/26/10 AHI 43  Past Surgical History: Last updated: 01/01/2009 Denies surgical history  Vital Signs:  Patient profile:   35 year old male Height:      73 inches (185.42 cm) Weight:      259.25 pounds (117.84 kg) BMI:     34.33 O2 Sat:      97 % on Room air Temp:     98.3 degrees F (36.83 degrees C) oral Pulse rate:   82 / minute BP sitting:   134 / 82  (left arm) Cuff size:   large  Vitals Entered By: Michel Bickers CMA (July 04, 2010 9:08 AM)  O2 Sat at Rest %:  97 O2 Flow:  Room air CC: Apnealink follow-up Comments Medications reviewed with patient Michel Bickers CMA  July 04, 2010 9:12 AM   Physical Exam  General:  normal appearance, healthy appearing, and obese.   Nose:  no deformity, discharge, inflammation, or lesions Mouth:  MP 3, enlarged tongue, triangular uvula Neck:  no JVD.   Lungs:  clear bilaterally to auscultation and percussion Heart:  regular rate and rhythm, S1, S2 without murmurs, rubs,  gallops, or clicks Extremities:  no clubbing, cyanosis, edema, or deformity noted Neurologic:  normal CN II-XII and strength normal.     Impression & Recommendations:  Problem # 1:  OBSTRUCTIVE SLEEP APNEA (ICD-327.23) He has severe sleep apnea.  Reviewed his sleep test report.  Explained how sleep apnea can affect his health.  Importance of weight loss and driving precautions discussed.  Treatment options reviewed.  Will arrange for auto CPAP first.  If this is unsuccessful, he would then need in lab titration.  He may also be a good candidate for an oral appliance.  Complete Medication List: 1)  Aspir-low 81 Mg Tbec (Aspirin) .... Once daily 2)  Lipitor 40 Mg Tabs (Atorvastatin calcium) .... One by mouth once daily  Other Orders: Est. Patient Level III (04540) DME Referral (DME)  Patient Instructions: 1)  Will set up CPAP machine at home 2)  Follow up in 6 to 8 weeks

## 2010-07-25 NOTE — Progress Notes (Signed)
Summary: home sleep test  Phone Note Call from Patient   Caller: Patient Call For: dr. Craige Cotta Summary of Call: Patient phoned stated that he saw on 06/03/10 and was told that someone would call him in a couple of days to schedule the home sleep test but he has not heard from anyone yet. Patient can be reached at [phone] or [phone] Initial call taken by: Vedia Coffer,  June 25, 2010 4:39 PM  Follow-up for Phone Call        VS please advise of this as there is no order in system for this.Reynaldo Minium CMA  June 25, 2010 4:54 PM   Additional Follow-up for Phone Call Additional follow up Details #1::        Order appears in my note, but was never sent through to [facility].  Not sure why.  Will resend order, and have [facility] arrange for apnealink. Additional Follow-up by: Coralyn Helling MD,  June 25, 2010 5:36 PM

## 2010-07-25 NOTE — Letter (Signed)
Summary: [facility] Consult Scheduled Letter  [facility]  [address] [place], [place] [address]   Phone: [phone]  Fax: [phone]      05/14/2010 MRN: [id number]  MARGARITO DEHAAS [address] [place], [place]  [address]    Dear Mr. ULLMAN,      We have scheduled an appointment for you.  At the recommendation of Dr.Jones, we have scheduled you a consult with [facility] Dr.Sood  on Dec 2,2011 at 3:15pm.Their phone number is [phone]. If this appointment day and time is not convenient for you, please feel free to call the office of the doctor you are being referred to at the number listed above and reschedule the appointment.  [facility] [address] [place] [address] [place] [address]   Thank you,  Patient Care Coordinator [facility]

## 2010-07-25 NOTE — Assessment & Plan Note (Signed)
Summary: sore throat/cd   Vital Signs:  Patient profile:   35 year old male Height:      73 inches Weight:      257 pounds BMI:     34.03 O2 Sat:      97 % on Room air Temp:     99.5 degrees F oral Pulse rate:   102 / minute Pulse rhythm:   regular Resp:     16 per minute BP sitting:   120 / 80  (left arm) Cuff size:   large  Vitals Entered By: Rock Nephew CMA (June 27, 2010 2:43 PM)  Nutrition Counseling: Patient's BMI is greater than 25 and therefore counseled on weight management options.  O2 Flow:  Room air CC: Patient c/o sore throat and  R wrist pain, URI symptoms Is Patient Diabetic? No Pain Assessment Patient in pain? no       Does patient need assistance? Functional Status Self care Ambulation Normal   Primary Care Provider:  Etta Grandchild MD  CC:  Patient c/o sore throat and  R wrist pain and URI symptoms.  History of Present Illness:  URI Symptoms      This is a 35 year old man who presents with URI symptoms.  The symptoms began 2 days ago.  The severity is described as mild.  The patient reports sore throat, but denies nasal congestion, clear nasal discharge, purulent nasal discharge, dry cough, productive cough, earache, and sick contacts.  Associated symptoms include fever.  The patient denies stiff neck, dyspnea, wheezing, rash, vomiting, diarrhea, use of an antipyretic, and response to antipyretic.  The patient denies itchy throat, sneezing, headache, muscle aches, and severe fatigue.  Risk factors for Strep sinusitis include tender adenopathy and absence of cough.  The patient denies the following risk factors for Strep sinusitis: unilateral facial pain, unilateral nasal discharge, poor response to decongestant, and tooth pain.    Also, he injured his right wrist 2 weeks ago when he was lifting a ladder and has some discomfort when he moves his wrist. He has not taken anything for pain.  Preventive Screening-Counseling &  Management  Alcohol-Tobacco     Alcohol drinks/day: <1     Alcohol Counseling: not indicated; patient does not drink     Smoking Status: quit     Packs/Day: 0.5     Year Started: 1994     Year Quit: 2007     Tobacco Counseling: not indicated; no tobacco use  Hep-HIV-STD-Contraception     Hepatitis Risk: no risk noted     HIV Risk: no risk noted     STD Risk: no risk noted      Sexual History:  currently monogamous.        Drug Use:  no.        Blood Transfusions:  no.    Clinical Review Panels:  Immunizations   Last Tetanus Booster:  Tdap (03/30/2009)   Last Flu Vaccine:  Fluvax 3+ (03/30/2009)  Lipid Management   Cholesterol:  254 (05/13/2010)   HDL (good cholesterol):  47.20 (05/13/2010)  Diabetes Management   Creatinine:  1.2 (05/13/2010)   Last Flu Vaccine:  Fluvax 3+ (03/30/2009)  CBC   WBC:  5.0 (05/13/2010)   RBC:  4.99 (05/13/2010)   Hgb:  15.0 (05/13/2010)   Hct:  43.3 (05/13/2010)   Platelets:  255.0 (05/13/2010)   MCV  86.9 (05/13/2010)   MCHC  34.6 (05/13/2010)   RDW  13.3 (05/13/2010)  PMN:  57.8 (05/13/2010)   Lymphs:  33.2 (05/13/2010)   Monos:  7.5 (05/13/2010)   Eosinophils:  1.0 (05/13/2010)   Basophil:  0.5 (05/13/2010)  Complete Metabolic Panel   Glucose:  87 (05/13/2010)   Sodium:  140 (05/13/2010)   Potassium:  4.4 (05/13/2010)   Chloride:  103 (05/13/2010)   CO2:  29 (05/13/2010)   BUN:  17 (05/13/2010)   Creatinine:  1.2 (05/13/2010)   Albumin:  4.3 (05/13/2010)   Total Protein:  7.7 (05/13/2010)   Calcium:  9.4 (05/13/2010)   Total Bili:  0.6 (05/13/2010)   Alk Phos:  58 (05/13/2010)   SGPT (ALT):  54 (05/13/2010)   SGOT (AST):  36 (05/13/2010)   Medications Prior to Update: 1)  Aspir-Low 81 Mg Tbec (Aspirin) .... Once Daily 2)  Lipitor 40 Mg Tabs (Atorvastatin Calcium) .... One By Mouth Once Daily 3)  Vicks Nyquil Multi-Symptom 15-6.25-500 Mg/66ml Liqd (Dm-Doxylamine-Acetaminophen) .... At Bedtime 4)  Guaifenesin 400  Mg Tabs (Guaifenesin) .... 2 By Mouth Two Times A Day  Current Medications (verified): 1)  Aspir-Low 81 Mg Tbec (Aspirin) .... Once Daily 2)  Lipitor 40 Mg Tabs (Atorvastatin Calcium) .... One By Mouth Once Daily 3)  Amoxicillin 500 Mg Tabs (Amoxicillin) .... One By Mouth Three Times A Day For 10 Days 4)  Naproxen 375 Mg Tabs (Naproxen) .... One By Mouth Two Times A Day As Needed For Wrist Pain  Allergies (verified): No Known Drug Allergies  Past History:  Past Medical History: Last updated: 01/01/2009 GERD Hyperlipidemia  Past Surgical History: Last updated: 01/01/2009 Denies surgical history  Family History: Last updated: 06/03/2010 Family History High cholesterol Family History C V A / Stroke ---PGM Family History MI/Heart Attack---PGF, MGF Family History of heart disease---paternal and maternal uncles  Social History: Last updated: 06/03/2010 Occupation: Theatre manager. single Alcohol use-no Drug use-no Regular exercise-yes Patient states former smoker.   Risk Factors: Alcohol Use: <1 (06/27/2010) Exercise: yes (03/30/2009)  Risk Factors: Smoking Status: quit (06/27/2010) Packs/Day: 0.5 (06/27/2010)  Family History: Reviewed history from 06/03/2010 and no changes required. Family History High cholesterol Family History C V A / Stroke ---PGM Family History MI/Heart Attack---PGF, MGF Family History of heart disease---paternal and maternal uncles  Social History: Reviewed history from 06/03/2010 and no changes required. Occupation: Theatre manager. single Alcohol use-no Drug use-no Regular exercise-yes Patient states former smoker.   Review of Systems       The patient complains of fever and enlarged lymph nodes.  The patient denies anorexia, chest pain, syncope, dyspnea on exertion, peripheral edema, prolonged cough, headaches, hemoptysis, abdominal pain, suspicious skin lesions, and depression.   MS:  Complains of joint pain and stiffness; denies joint  redness, joint swelling, loss of strength, muscle aches, and cramps.  Physical Exam  General:  alert, well-developed, well-nourished, well-hydrated, appropriate dress, normal appearance, healthy-appearing, cooperative to examination, good hygiene, and overweight-appearing.   Head:  normocephalic, atraumatic, no abnormalities observed, and no abnormalities palpated.   Eyes:  vision grossly intact and pupils equal.   Ears:  R ear normal and L ear normal.   Nose:  External nasal examination shows no deformity or inflammation. Nasal mucosa are pink and moist without lesions or exudates. Mouth:  good dentition, no pharyngeal crowding, pharyngeal erythema,symmetrical tonsil hypertropied, and pharyngeal exudate.  good dentition, no pharyngeal crowing, no petechiae, pharyngeal erythema, tonsil hypertropied, and pharyngeal exudate.   Neck:  supple, full ROM, no masses, and cervical lymphadenopathy.   Lungs:  normal respiratory effort,  no intercostal retractions, no accessory muscle use, normal breath sounds, no dullness, no fremitus, no crackles, and no wheezes.   Heart:  normal rate, regular rhythm, no murmur, no gallop, no rub, and no JVD.   Abdomen:  soft, non-tender, normal bowel sounds, no distention, no masses, no guarding, no rigidity, no rebound tenderness, no abdominal hernia, no inguinal hernia, no hepatomegaly, and no splenomegaly.   Msk:  normal ROM, no joint tenderness, no joint swelling, no joint warmth, no redness over joints, no joint deformities, no joint instability, no crepitation, and no muscle atrophy.   Pulses:  R and L carotid,radial,femoral,dorsalis pedis and posterior tibial pulses are full and equal bilaterally Extremities:  No clubbing, cyanosis, edema, or deformity noted with normal full range of motion of all joints.   Neurologic:  No cranial nerve deficits noted. Station and gait are normal. Plantar reflexes are down-going bilaterally. DTRs are symmetrical throughout. Sensory,  motor and coordinative functions appear intact. Skin:  Intact without suspicious lesions or rashes Cervical Nodes:  no posterior cervical adenopathy, R anterior LN tender, L anterior LN tender, R anterior LN enlarged, and L anterior LN enlarged.   Axillary Nodes:  no R axillary adenopathy and no L axillary adenopathy.   Inguinal Nodes:  no R inguinal adenopathy and no L inguinal adenopathy.   Psych:  Cognition and judgment appear intact. Alert and cooperative with normal attention span and concentration. No apparent delusions, illusions, hallucinations   Impression & Recommendations:  Problem # 1:  STREP THROAT (ICD-034.0) Assessment New  His updated medication list for this problem includes:    Aspir-low 81 Mg Tbec (Aspirin) ..... Once daily    Amoxicillin 500 Mg Tabs (Amoxicillin) ..... One by mouth three times a day for 10 days    Naproxen 375 Mg Tabs (Naproxen) ..... One by mouth two times a day as needed for wrist pain  Orders: Admin of Therapeutic Inj  intramuscular or subcutaneous (16109) Bicillin LA 1.2 million units Injection (J0561) Bicillin LA 1.2 million units Injection (U0454)  Problem # 2:  WRIST INJURY, RIGHT (ICD-959.3) Assessment: New will check for fracture Orders: T-Wrist Comp Right (73110TC)  Problem # 3:  WRIST SPRAIN, RIGHT (ICD-842.00) Assessment: New will try some nsaids  Complete Medication List: 1)  Aspir-low 81 Mg Tbec (Aspirin) .... Once daily 2)  Lipitor 40 Mg Tabs (Atorvastatin calcium) .... One by mouth once daily 3)  Amoxicillin 500 Mg Tabs (Amoxicillin) .... One by mouth three times a day for 10 days 4)  Naproxen 375 Mg Tabs (Naproxen) .... One by mouth two times a day as needed for wrist pain  Patient Instructions: 1)  Please schedule a follow-up appointment in 2 weeks. 2)  Get plenty of rest, drink lots of clear liquids, and use Tylenol or Ibuprofen for fever and comfort. Return in 7-10 days if you're not better:sooner if you're feeling  worse. 3)  Take 650-1000mg  of Tylenol every 4-6 hours as needed for relief of pain or comfort of fever AVOID taking more than 4000mg   in a 24 hour period (can cause liver damage in higher doses). 4)  Take your antibiotic as prescribed until ALL of it is gone, but stop if you develop a rash or swelling and contact our office as soon as possible. Prescriptions: NAPROXEN 375 MG TABS (NAPROXEN) One by mouth two times a day as needed for wrist pain  #60 x 2   Entered and Authorized by:   Etta Grandchild MD   Signed by:  Etta Grandchild MD on 06/27/2010   Method used:   Electronically to        [facility]. [phone]* (retail)       [address]       [place], [place]  [address]       Ph: [phone]       Fax: [phone]   RxID:   [id number] AMOXICILLIN 500 MG TABS (AMOXICILLIN) One by mouth three times a day for 10 days  #30 x 0   Entered and Authorized by:   Etta Grandchild MD   Signed by:   Etta Grandchild MD on 06/27/2010   Method used:   Electronically to        [facility]. [phone]* (retail)       [address]       [place], [place]  [address]       Ph: [phone]       Fax: [phone]   RxID:   [id number]    Medication Administration  Injection # 1:    Medication: Bicillin LA 1.2 million units Injection    Diagnosis: STREP THROAT (ICD-034.0)    Route: IM    Site: RUOQ gluteus    Exp Date: 08/2011    Lot #: [id number]    Mfr: Brooke Dare    Patient tolerated injection without complications    Given by: Rock Nephew CMA (June 27, 2010 3:32 PM)  Injection # 2:    Medication: Bicillin LA 1.2 million units Injection    Diagnosis: STREP THROAT (ICD-034.0)    Route: IM    Site: LUOQ gluteus    Exp Date: 08/2011    Lot #: [id number]    Mfr: Brooke Dare    Patient tolerated injection without complications    Given by: Rock Nephew CMA (June 27, 2010 3:33 PM)  Orders Added: 1)  T-Wrist Comp Right  [73110TC] 2)  Admin of Therapeutic Inj  intramuscular or subcutaneous [96372] 3)  Bicillin LA 1.2 million units Injection [J0561] 4)  Bicillin LA 1.2 million units Injection [J0561] 5)  Est. Patient Level IV [37628]     Medication Administration  Injection # 1:    Medication: Bicillin LA 1.2 million units Injection    Diagnosis: STREP THROAT (ICD-034.0)    Route: IM    Site: RUOQ gluteus    Exp Date: 08/2011    Lot #: [id number]    Mfr: Brooke Dare    Patient tolerated injection without complications    Given by: Rock Nephew CMA (June 27, 2010 3:32 PM)  Injection # 2:    Medication: Bicillin LA 1.2 million units Injection    Diagnosis: STREP THROAT (ICD-034.0)    Route: IM    Site: LUOQ gluteus    Exp Date: 08/2011    Lot #: [id number]    Mfr: Brooke Dare    Patient tolerated injection without complications    Given by: Rock Nephew CMA (June 27, 2010 3:33 PM)  Orders Added: 1)  T-Wrist Comp Right [73110TC] 2)  Admin of Therapeutic Inj  intramuscular or subcutaneous [96372] 3)  Bicillin LA 1.2 million units Injection [J0561] 4)  Bicillin LA 1.2 million units Injection [J0561] 5)  Est. Patient Level IV [37106]

## 2010-07-25 NOTE — Assessment & Plan Note (Signed)
Summary: SNORING ///KP   Visit Type:  Initial Consult Copy to:  Dr. Sanda Linger Primary Teryl Mcconaghy/Referring Rada Zegers:  Etta Grandchild MD  CC:  Sleep consult.Marland KitchenMarland KitchenEpworth score is 10.Marland Kitchen  History of Present Illness: 35 yo for sleep evaluation.  He has noticed a problem for about one year.  He finally decided to do something after his girlfriend made a comment that he snores and stops breathing while asleep.  He goes to bed at 1130.  He falls asleep quickly.  He wakes up twice per night.  He gets out of bed at 630.  He feels tired in the morning, and throughout the day.  He denies headaches.  He does not use anything to help sleep.  He has tried energy drinks, but these don't help.  He will nap on weekends, but this does not help much.  He denies sleep walking, sleep talking, or bruxism. He gets frequent dreams.  He denies restless legs.  There is no history of sleep hallucinations, sleep paralysis, or cataplexy.  There is no history of thyroid disease or depression.  He has gained about 40 lbs over the past several months.  He has several relatives who snore.   Preventive Screening-Counseling & Management  Alcohol-Tobacco     Alcohol drinks/day: <1     Smoking Status: quit     Packs/Day: 0.5     Year Started: 1994     Year Quit: 2007  Current Medications (verified): 1)  Aspir-Low 81 Mg Tbec (Aspirin) .... Once Daily 2)  Lipitor 40 Mg Tabs (Atorvastatin Calcium) .... One By Mouth Once Daily 3)  Vicks Nyquil Multi-Symptom 15-6.25-500 Mg/83ml Liqd (Dm-Doxylamine-Acetaminophen) .... At Bedtime 4)  Guaifenesin 400 Mg Tabs (Guaifenesin) .... 2 By Mouth Two Times A Day  Allergies (verified): No Known Drug Allergies  Past History:  Past Medical History: Last updated: 01/01/2009 GERD Hyperlipidemia  Past Surgical History: Last updated: 01/01/2009 Denies surgical history  Family History: Last updated: 06/03/2010 Family History High cholesterol Family History C V A / Stroke  ---PGM Family History MI/Heart Attack---PGF, MGF Family History of heart disease---paternal and maternal uncles  Social History: Last updated: 06/03/2010 Occupation: Theatre manager. single Alcohol use-no Drug use-no Regular exercise-yes Patient states former smoker.   Family History: Family History High cholesterol Family History C V A / Stroke ---PGM Family History MI/Heart Attack---PGF, MGF Family History of heart disease---paternal and maternal uncles  Social History: Occupation: Theatre manager. single Alcohol use-no Drug use-no Regular exercise-yes Patient states former smoker.  Alcohol drinks/day:  <1 Smoking Status:  quit Packs/Day:  0.5  Vital Signs:  Patient profile:   34 year old male Height:      73 inches (185.42 cm) Weight:      264 pounds (120.00 kg) BMI:     34.96 O2 Sat:      96 % on Room air Temp:     97.7 degrees F (36.50 degrees C) oral Pulse rate:   92 / minute BP sitting:   120 / 84  (left arm) Cuff size:   large  Vitals Entered By: Michel Bickers CMA (June 03, 2010 9:45 AM)  O2 Sat at Rest %:  96 O2 Flow:  Room air CC: Sleep consult.Marland KitchenMarland KitchenEpworth score is 10. Is Patient Diabetic? No Comments Medications reviewed with patient Michel Bickers [facility]  June 03, 2010 9:46 AM   Physical Exam  General:  normal appearance, healthy appearing, and obese.   Eyes:  PERRLA and EOMI.   Nose:  no deformity,  discharge, inflammation, or lesions Mouth:  MP 3, enlarged tongue, triangular uvula Neck:  no JVD.   Chest Wall:  no deformities noted Lungs:  clear bilaterally to auscultation and percussion Heart:  regular rate and rhythm, S1, S2 without murmurs, rubs, gallops, or clicks Abdomen:  bowel sounds positive; abdomen soft and non-tender without masses, or organomegaly Pulses:  pulses normal Extremities:  no clubbing, cyanosis, edema, or deformity noted Neurologic:  normal CN II-XII and strength normal.   Cervical Nodes:  no significant adenopathy Psych:   alert and cooperative; normal mood and affect; normal attention span and concentration   Impression & Recommendations:  Problem # 1:  SNORING (ICD-786.09) He has snoring and witnessed apnea.  He has daytime sleepiness.  I am concerned that he may have sleep apnea.  To further assess this I will arrange for a home sleep test.  Is this is non-diagnostic he may need an in-lab study.  I reviewed what sleep apnea is.  I explained how sleep apnea can affect his health.  Driving precautions and need for weight loss were reviewed.  Problem # 2:  PALPITATIONS (ICD-785.1) He has previous episodes of feeling like his heart races and skips.  His last episode of this was about 3 years ago.  Explained to him that he may need to have further evaluation for this, and that he should d/w his primary physician.  Medications Added to Medication List This Visit: 1)  Vicks Nyquil Multi-symptom 15-6.25-500 Mg/18ml Liqd (Dm-doxylamine-acetaminophen) .... At bedtime 2)  Guaifenesin 400 Mg Tabs (Guaifenesin) .... 2 by mouth two times a day  Complete Medication List: 1)  Aspir-low 81 Mg Tbec (Aspirin) .... Once daily 2)  Lipitor 40 Mg Tabs (Atorvastatin calcium) .... One by mouth once daily 3)  Vicks Nyquil Multi-symptom 15-6.25-500 Mg/49ml Liqd (Dm-doxylamine-acetaminophen) .... At bedtime 4)  Guaifenesin 400 Mg Tabs (Guaifenesin) .... 2 by mouth two times a day  Other Orders: Consultation Level IV (16109) Sleep Disorder Referral (Sleep Disorder)  Patient Instructions: 1)  Will schedule sleep test at home 2)  Will call to schedule follow up after sleep test reviewed

## 2010-07-25 NOTE — Procedures (Signed)
Summary: ApneaLink  ApneaLink   Imported By: Lester Queens 07/16/2010 11:23:19  _____________________________________________________________________  External Attachment:    Type:   Image     Comment:   External Document

## 2010-07-25 NOTE — Letter (Signed)
Summary: Out of Work  [facility]  [address] [place], [place] [address]   Phone: [phone]  Fax: [phone]    June 27, 2010   Employee:  Karlyn Agee    To Whom It May Concern:   For Medical reasons, please excuse the above named employee from work for the following dates:  Start:   06/27/10  End:   07/02/10  If you need additional information, please feel free to contact our office.         Sincerely,    Etta Grandchild MD

## 2010-08-21 ENCOUNTER — Ambulatory Visit: Payer: Self-pay | Admitting: Pulmonary Disease

## 2010-08-22 ENCOUNTER — Telehealth: Payer: Self-pay | Admitting: Pulmonary Disease

## 2010-08-28 ENCOUNTER — Encounter: Payer: Self-pay | Admitting: Pulmonary Disease

## 2010-08-29 NOTE — Progress Notes (Signed)
Summary: nos appt  Phone Note Call from Patient   Caller: [email or web address]  Call For: Afton Lavalle Summary of Call: ATC pt to rsc nos from 2/29 no vm. Initial call taken by: Darletta Moll,  August 22, 2010 9:13 AM

## 2010-09-10 NOTE — Miscellaneous (Addendum)
Summary: Auto CPAP download 07/12/10 to 08/28/10  Clinical Lists Changes Used on 39 of 48 nights with average 5hrs 21 min.  Optimal pressure 13 cm H2O with average AHI 0.8.  Will have my nurse inform pt that CPAP report looked good, will set pressure to 13 cm H2O, and arrange for follow up with-in next 4 to 6 weeks.  Appended Document: Auto CPAP download 07/12/10 to 08/28/10 lmomtcb x1 to set pt up w/ apt  Appended Document: Auto CPAP download 07/12/10 to 08/28/10 lmomtcb x2   Appended Document: Auto CPAP download 07/12/10 to 08/28/10 see epic

## 2010-09-12 ENCOUNTER — Telehealth: Payer: Self-pay | Admitting: *Deleted

## 2010-09-12 NOTE — Telephone Encounter (Signed)
Clinical Lists Update by Coralyn Helling, MD on 09/05/2010 4:09 PM     Summary: Auto CPAP download 07/12/10 to 08/28/10  Clinical Lists Changes  Used on 39 of 48 nights with average 5hrs 21 min. Optimal pressure 13 cm H2O with average AHI 0.8.  Will have my nurse inform pt that CPAP report looked good, will set pressure to 13 cm H2O, and arrange for follow up with-in next 4 to 6 weeks.  Appended Document: Auto CPAP download 07/12/10 to 08/28/10  lmomtcb x1 to set pt up w/ apt   Pt is aware of results and is coming in 4/23 at 11:30. Pt aware of address   [place], [place]

## 2010-09-30 ENCOUNTER — Encounter: Payer: Self-pay | Admitting: Pulmonary Disease

## 2010-10-10 ENCOUNTER — Encounter: Payer: Self-pay | Admitting: Pulmonary Disease

## 2010-10-14 ENCOUNTER — Ambulatory Visit: Payer: Self-pay | Admitting: Pulmonary Disease

## 2010-11-04 ENCOUNTER — Encounter: Payer: Self-pay | Admitting: Pulmonary Disease

## 2010-11-04 ENCOUNTER — Ambulatory Visit (INDEPENDENT_AMBULATORY_CARE_PROVIDER_SITE_OTHER): Payer: BC Managed Care – PPO | Admitting: Pulmonary Disease

## 2010-11-04 VITALS — BP 120/80 | HR 79 | Temp 98.3°F | Ht 72.5 in | Wt 266.8 lb

## 2010-11-04 DIAGNOSIS — G4733 Obstructive sleep apnea (adult) (pediatric): Secondary | ICD-10-CM

## 2010-11-04 NOTE — Patient Instructions (Signed)
Will get copy of CPAP report and call with results  Follow up in 6 months 

## 2010-11-04 NOTE — Progress Notes (Signed)
  Subjective:    Patient ID: [place], male    DOB: 05/16/1976, 35 y.o.   MRN: [id number]  HPI 35 yo male OSA.  Good with cpap, nasal mask, no sinus or throat okay Sleep better, no snore, good energy [facility]  Past Medical History  Diagnosis Date  . Hyperlipidemia   . GERD (gastroesophageal reflux disease)   . OSA (obstructive sleep apnea)     apnealink 06/26/10 AHI 43     Family History  Problem Relation Age of Onset  . Hyperlipidemia Other   . Stroke Paternal Grandmother   . Heart attack Paternal Grandfather   . Heart attack Maternal Grandfather   . Heart disease Paternal Uncle   . Heart disease Maternal Uncle      History   Social History  . Marital Status: Married    Spouse Name: N/A    Number of Children: N/A  . Years of Education: N/A   Occupational History  . service dept Time [employer]   Social History Main Topics  . Smoking status: Former Smoker -- 0.5 packs/day for 10 years    Types: Cigarettes    Quit date: 11/04/2003  . Smokeless tobacco: Not on file  . Alcohol Use: No  . Drug Use: No  . Sexually Active: Not on file   Other Topics Concern  . Not on file   Social History Narrative   Regular exercise - yes     No Known Allergies   Outpatient Prescriptions Prior to Visit  Medication Sig Dispense Refill  . aspirin 81 MG tablet Take 81 mg by mouth daily.        Marland Kitchen atorvastatin (LIPITOR) 40 MG tablet Take 40 mg by mouth daily.           Review of Systems     Objective:   Physical Exam Filed Vitals:   11/04/10 1410 11/04/10 1414  BP:  120/80  Pulse:  79  Temp: 98.3 F (36.8 C)   TempSrc: Oral   Height: 6' 0.5" (1.842 m)   Weight: 266 lb 12.8 oz (121.02 kg)   SpO2:  97%       General: normal appearance, healthy appearing, and obese.  Nose: no deformity, discharge, inflammation, or lesions  Mouth: MP 3, enlarged tongue, triangular uvula  Neck: no JVD.  Lungs: clear bilaterally to auscultation and percussion  Heart: regular  rate and rhythm, S1, S2 without murmurs, rubs, gallops, or clicks  Extremities: no clubbing, cyanosis, edema, or deformity noted  Neurologic: normal CN II-XII and strength normal.     Assessment & Plan:   OBSTRUCTIVE SLEEP APNEA He is doing well on CPAP.  Will get copy of his CPAP download and call him with results.    Updated Medication List Outpatient Encounter Prescriptions as of 11/04/2010  Medication Sig Dispense Refill  . aspirin 81 MG tablet Take 81 mg by mouth daily.        . simvastatin (ZOCOR) 40 MG tablet Take 40 mg by mouth at bedtime.        Marland Kitchen atorvastatin (LIPITOR) 40 MG tablet Take 40 mg by mouth daily.

## 2010-11-04 NOTE — Assessment & Plan Note (Signed)
He is doing well on CPAP.  Will get copy of his CPAP download and call him with results.

## 2011-02-19 ENCOUNTER — Ambulatory Visit (INDEPENDENT_AMBULATORY_CARE_PROVIDER_SITE_OTHER): Payer: BC Managed Care – PPO | Admitting: Internal Medicine

## 2011-02-19 ENCOUNTER — Encounter: Payer: Self-pay | Admitting: Internal Medicine

## 2011-02-19 ENCOUNTER — Other Ambulatory Visit: Payer: Self-pay | Admitting: Internal Medicine

## 2011-02-19 ENCOUNTER — Other Ambulatory Visit (INDEPENDENT_AMBULATORY_CARE_PROVIDER_SITE_OTHER): Payer: BC Managed Care – PPO

## 2011-02-19 DIAGNOSIS — E785 Hyperlipidemia, unspecified: Secondary | ICD-10-CM

## 2011-02-19 DIAGNOSIS — K219 Gastro-esophageal reflux disease without esophagitis: Secondary | ICD-10-CM

## 2011-02-19 LAB — CBC WITH DIFFERENTIAL/PLATELET
Basophils Absolute: 0 10*3/uL (ref 0.0–0.1)
Basophils Relative: 0.7 % (ref 0.0–3.0)
Eosinophils Absolute: 0.1 10*3/uL (ref 0.0–0.7)
Eosinophils Relative: 1.6 % (ref 0.0–5.0)
HCT: 44.1 % (ref 39.0–52.0)
Hemoglobin: 14.6 g/dL (ref 13.0–17.0)
Lymphocytes Relative: 28.5 % (ref 12.0–46.0)
Lymphs Abs: 1.5 10*3/uL (ref 0.7–4.0)
MCHC: 33.2 g/dL (ref 30.0–36.0)
MCV: 86.7 fl (ref 78.0–100.0)
Monocytes Absolute: 0.4 10*3/uL (ref 0.1–1.0)
Monocytes Relative: 8.7 % (ref 3.0–12.0)
Neutro Abs: 3.1 10*3/uL (ref 1.4–7.7)
Neutrophils Relative %: 60.5 % (ref 43.0–77.0)
Platelets: 234 10*3/uL (ref 150.0–400.0)
RBC: 5.09 Mil/uL (ref 4.22–5.81)
RDW: 13.5 % (ref 11.5–14.6)
WBC: 5.2 10*3/uL (ref 4.5–10.5)

## 2011-02-19 LAB — COMPREHENSIVE METABOLIC PANEL
ALT: 38 U/L (ref 0–53)
AST: 26 U/L (ref 0–37)
Albumin: 4.3 g/dL (ref 3.5–5.2)
Alkaline Phosphatase: 73 U/L (ref 39–117)
BUN: 13 mg/dL (ref 6–23)
CO2: 30 mEq/L (ref 19–32)
Calcium: 9.3 mg/dL (ref 8.4–10.5)
Chloride: 103 mEq/L (ref 96–112)
Creatinine, Ser: 1.3 mg/dL (ref 0.4–1.5)
GFR: 82.58 mL/min (ref >60.00)
Glucose, Bld: 101 mg/dL — ABNORMAL HIGH (ref 70–99)
Potassium: 4.5 mEq/L (ref 3.5–5.1)
Sodium: 140 mEq/L (ref 135–145)
Total Bilirubin: 0.4 mg/dL (ref 0.3–1.2)
Total Protein: 7.8 g/dL (ref 6.0–8.3)

## 2011-02-19 LAB — LIPID PANEL
Cholesterol: 219 mg/dL — ABNORMAL HIGH (ref 0–200)
HDL: 38.8 mg/dL — ABNORMAL LOW (ref >39.00)
Total CHOL/HDL Ratio: 6
Triglycerides: 235 mg/dL — ABNORMAL HIGH (ref 0.0–149.0)
VLDL: 47 mg/dL — ABNORMAL HIGH (ref 0.0–40.0)

## 2011-02-19 LAB — TSH: TSH: 1.5 u[IU]/mL (ref 0.35–5.50)

## 2011-02-19 LAB — LDL CHOLESTEROL, DIRECT: Direct LDL: 141.6 mg/dL

## 2011-02-19 MED ORDER — OMEPRAZOLE 40 MG PO CPDR: 40.0000 mg | DELAYED_RELEASE_CAPSULE | Freq: Every day | ORAL | Status: DC

## 2011-02-19 MED ORDER — ATORVASTATIN CALCIUM 40 MG PO TABS: 40.0000 mg | ORAL_TABLET | Freq: Every day | ORAL | Status: DC

## 2011-02-19 NOTE — Assessment & Plan Note (Signed)
Continue lipitor and check labs today

## 2011-02-19 NOTE — Progress Notes (Signed)
Subjective:    Patient ID: Howard Hays, male    DOB: [birth year], 35 y.o.   MRN: [id number]  Gastrophageal Reflux He complains of heartburn. He reports no abdominal pain, no belching, no chest pain, no choking, no coughing, no dysphagia, no early satiety, no globus sensation, no hoarse voice, no nausea, no sore throat, no stridor, no tooth decay, no water brash or no wheezing. This is a chronic problem. The current episode started more than 1 year ago. The problem occurs frequently. The problem has been unchanged. The heartburn is located in the substernum. The heartburn is of mild intensity. The heartburn does not wake him from sleep. The heartburn does not limit his activity. The heartburn doesn't change with position. The symptoms are aggravated by certain foods. Pertinent negatives include no anemia, fatigue, melena, muscle weakness, orthopnea or weight loss. Risk factors include no known risk factors. He has tried a PPI for the symptoms. The treatment provided moderate relief.  Hyperlipidemia This is a chronic problem. The current episode started more than 1 year ago. The problem is controlled. Recent lipid tests were reviewed and are variable. He has no history of chronic renal disease, diabetes, hypothyroidism, liver disease, obesity or nephrotic syndrome. Factors aggravating his hyperlipidemia include no known factors. Pertinent negatives include no chest pain, focal sensory loss, focal weakness, leg pain, myalgias or shortness of breath. Current antihyperlipidemic treatment includes statins. The current treatment provides moderate improvement of lipids. Compliance problems include adherence to exercise and adherence to diet.       Review of Systems  Constitutional: Negative for fever, chills, weight loss, diaphoresis, activity change, appetite change, fatigue and unexpected weight change.  HENT: Negative.  Negative for sore throat and hoarse voice.   Eyes: Negative.   Respiratory: Negative  for apnea, cough, choking, chest tightness, shortness of breath, wheezing and stridor.   Cardiovascular: Negative for chest pain, palpitations and leg swelling.  Gastrointestinal: Positive for heartburn. Negative for dysphagia, nausea, vomiting, abdominal pain, diarrhea, constipation, blood in stool, melena, abdominal distention, anal bleeding and rectal pain.  Genitourinary: Negative for dysuria, urgency, frequency, hematuria, flank pain, decreased urine volume, enuresis, difficulty urinating and genital sores.  Musculoskeletal: Negative for myalgias, back pain, joint swelling, arthralgias, gait problem and muscle weakness.  Skin: Negative for color change, pallor, rash and wound.  Neurological: Negative for dizziness, tremors, focal weakness, seizures, syncope, facial asymmetry, speech difficulty, weakness, light-headedness, numbness and headaches.  Hematological: Negative for adenopathy. Does not bruise/bleed easily.  Psychiatric/Behavioral: Negative.        Objective:   Physical Exam  Vitals reviewed. Constitutional: He is oriented to person, place, and time. He appears well-developed and well-nourished. No distress.  HENT:  Mouth/Throat: Oropharynx is clear and moist. No oropharyngeal exudate.  Eyes: Conjunctivae are normal. Right eye exhibits no discharge. Left eye exhibits no discharge. No scleral icterus.  Neck: Normal range of motion. Neck supple. No JVD present. No tracheal deviation present. No thyromegaly present.  Cardiovascular: Normal rate, regular rhythm, normal heart sounds and intact distal pulses.  Exam reveals no gallop and no friction rub.   No murmur heard. Pulmonary/Chest: Effort normal and breath sounds normal. No stridor. No respiratory distress. He has no wheezes. He has no rales. He exhibits no tenderness.  Abdominal: Soft. Bowel sounds are normal. He exhibits no distension and no mass. There is no tenderness. There is no rebound and no guarding.  Musculoskeletal:  Normal range of motion. He exhibits no edema and no tenderness.  Lymphadenopathy:  He has no cervical adenopathy.  Neurological: He is alert and oriented to person, place, and time. He has normal reflexes. He displays normal reflexes. No cranial nerve deficit. He exhibits normal muscle tone. Coordination normal.  Skin: Skin is warm and dry. No rash noted. He is not diaphoretic. No erythema. No pallor.  Psychiatric: He has a normal mood and affect. His behavior is normal. Judgment and thought content normal.          Assessment & Plan:

## 2011-02-19 NOTE — Patient Instructions (Signed)
Hypercholesterolemia High Blood Cholesterol Cholesterol is a white, waxy, fat-like protein needed by your body in small amounts. The liver makes all the cholesterol you need. It is carried from the liver by the blood through the blood vessels. Deposits (plaque) may build up on blood vessel walls. This makes the arteries narrower and stiffer. Plaque increases the risk for heart attack and stroke. You cannot feel your cholesterol level even if it is very high. The only way to know is by a blood test to check your lipid (fats) levels. Once you know your cholesterol levels, you should keep a record of the test results. Work with your caregiver to to keep your levels in the desired range. WHAT THE RESULTS MEAN:  Total cholesterol is a rough measure of all the cholesterol in your blood.   LDL is the so-called bad cholesterol. This is the type that deposits cholesterol in the walls of the arteries. You want this level to be low.   HDL is the good cholesterol because it cleans the arteries and carries the LDL away. You want this level to be high.   Triglycerides are fat that the body can either burn for energy or store. High levels are closely linked to heart disease.  DESIRED LEVELS:  Total cholesterol below 200.   LDL below 100 for people at risk, below 70 for very high risk.   HDL above 50 is good, above 60 is best.   Triglycerides below 150.  HOW TO LOWER YOUR CHOLESTEROL:  Diet.   Choose fish or white meat chicken and [place], roasted or baked. Limit fatty cuts of red meat, fried foods, and processed meats, such as sausage and lunch meat.   Eat lots of fresh fruits and vegetables. Choose whole grains, beans, pasta, potatoes and cereals.   Use only small amounts of olive, corn or canola oils. Avoid butter, mayonnaise, shortening or palm kernel oils. Avoid foods with trans-fats.   Use skim/nonfat milk and low-fat/nonfat yogurt and cheeses. Avoid whole milk, cream, ice cream, egg yolks and  cheeses. Healthy desserts include angel food cake, gingersnaps, animal crackers, hard candy, popsicles, and low-fat/nonfat frozen yogurt. Avoid pastries, cakes, pies and cookies.   Exercise.   A regular program helps decrease LDL and raises HDL.   Helps with weight control.   Do things that increase your activity level like gardening, walking, or taking the stairs.   Medication.   May be prescribed by your caregiver to help lowering cholesterol and the risk for heart disease.   You may need medicine even if your levels are normal if you have several risk factors.  HOME CARE INSTRUCTIONS  Follow your diet and exercise programs as suggested by your caregiver.   Take medications as directed.   Have blood work done when your caregiver feels it is necessary.  MAKE SURE YOU:   Understand these instructions.   Will watch your condition.   Will get help right away if you are not doing well or get worse.  Document Released: 06/09/2005 Document Re-Released: 05/22/2008 [facility] Patient Information 2011 [place], [place].Esophagitis (Heartburn) Esophagitis (heartburn) is a painful, burning sensation in the chest. It may feel worse in certain positions, such as lying down or bending over. It is caused by stomach acid backing up into the tube that carries food from the mouth down to the stomach (lower esophagus). TREATMENT There are a number of non-prescription medicines used to treat heartburn, including:  Antacids.   Acid reducers (also called H-2 blockers).  Proton-pump inhibitors.  HOME CARE INSTRUCTIONS  Raise the head of your bead by putting blocks under the legs.   Eat 2-3 hours before going to bed.   Stop smoking.   Try to reach and maintain a healthy weight.   Do not eat just a few very large meals. Instead, eat many smaller meals throughout the day.   Try to identify foods and beverages that make your symptoms worse, and avoid these.   Avoid tight clothing.   Do not  exercise right after eating.  SEEK IMMEDIATE MEDICAL CARE IF YOU:  Have severe chest pain that goes down your arm, or into your jaw or neck.   Feel sweaty, dizzy, or lightheaded.   Are short of breath.   Throw up (vomit) blood.   Have difficulty or pain with swallowing.   Have bloody or black, tarry stools.   Have bouts of heartburn more than three times a week for more than two weeks.  Document Released: 07/17/2004 Document Re-Released: 09/03/2009 [facility] Patient Information 2011 [place], [place].

## 2011-02-19 NOTE — Assessment & Plan Note (Signed)
Start prilosec 

## 2011-05-06 ENCOUNTER — Ambulatory Visit (INDEPENDENT_AMBULATORY_CARE_PROVIDER_SITE_OTHER): Payer: BC Managed Care – PPO | Admitting: Endocrinology

## 2011-05-06 ENCOUNTER — Encounter: Payer: Self-pay | Admitting: Endocrinology

## 2011-05-06 DIAGNOSIS — M542 Cervicalgia: Secondary | ICD-10-CM

## 2011-05-06 MED ORDER — AZITHROMYCIN 500 MG PO TABS: 500.0000 mg | ORAL_TABLET | Freq: Every day | ORAL | Status: AC

## 2011-05-06 MED ORDER — AZITHROMYCIN 500 MG PO TABS: 500.0000 mg | ORAL_TABLET | Freq: Every day | ORAL | Status: DC

## 2011-05-06 NOTE — Patient Instructions (Signed)
i have sent a prescription to your pharmacy, for an antibiotic. Try taking advil, or putting an anti-pain cream on the back of your neck.  I hope you feel better soon.  If you don't feel better by next week, please call your doctor.

## 2011-05-06 NOTE — Progress Notes (Signed)
  Subjective:    Patient ID: Howard Hays, male    DOB: 10-Jul-1975, 35 y.o.   MRN: [id number]  HPI Pt states few days of slight burning sensation at the upper anterior neck.  No assoc fever.  He also has 2 weeks of tightness at the right posterior neck.  No injury.  Past Medical History  Diagnosis Date  . Hyperlipidemia   . GERD (gastroesophageal reflux disease)   . OSA (obstructive sleep apnea)     apnealink 06/26/10 [facility] 43    No past surgical history on file.  History   Social History  . Marital Status: Married    Spouse Name: N/A    Number of Children: N/A  . Years of Education: N/A   Occupational History  . service dept Time [employer]   Social History Main Topics  . Smoking status: Former Smoker -- 0.5 packs/day for 10 years    Types: Cigarettes    Quit date: 11/04/2003  . Smokeless tobacco: Not on file  . Alcohol Use: No  . Drug Use: No  . Sexually Active: Yes    Birth Control/ Protection: Condom   Other Topics Concern  . Not on file   Social History Narrative   Regular exercise - yes    Current Outpatient Prescriptions on File Prior to Visit  Medication Sig Dispense Refill  . aspirin 81 MG tablet Take 81 mg by mouth daily.        Marland Kitchen atorvastatin (LIPITOR) 40 MG tablet Take 1 tablet (40 mg total) by mouth daily.  90 tablet  3  . omeprazole (PRILOSEC) 40 MG capsule Take 1 capsule (40 mg total) by mouth daily.  90 capsule  3    No Known Allergies  Family History  Problem Relation Age of Onset  . Hyperlipidemia Other   . Stroke Paternal Grandmother   . Heart attack Paternal Grandfather   . Heart attack Maternal Grandfather   . Heart disease Paternal Uncle   . Heart disease Maternal Uncle     BP 118/82  Pulse 84  Temp(Src) 98.6 F (37 C) (Oral)  Ht 6\' 1"  (1.854 m)  Wt 263 lb (119.296 kg)  BMI 34.70 kg/m2  SpO2 97%  Review of Systems Denies nasal congestion and earache.    Objective:   Physical Exam VITAL SIGNS:  See vs page GENERAL: no  distress head: no deformity eyes: no periorbital swelling, no proptosis external nose and ears are normal mouth: no lesion seen Both eac's and tm's are normal NECK: supple.  There is no palpable thyroid enlargement.  No thyroid nodule is palpable.  No palpable lymphadenopathy at the neck.     Assessment & Plan:  Neck sxs, new, uncertain etiology

## 2011-05-10 DIAGNOSIS — M542 Cervicalgia: Secondary | ICD-10-CM | POA: Insufficient documentation

## 2011-08-05 ENCOUNTER — Telehealth: Payer: Self-pay | Admitting: Pulmonary Disease

## 2011-08-05 DIAGNOSIS — G4733 Obstructive sleep apnea (adult) (pediatric): Secondary | ICD-10-CM

## 2011-08-05 NOTE — Telephone Encounter (Signed)
[facility], please advise thanks  Almyra Free is aware of this

## 2011-08-05 NOTE — Telephone Encounter (Signed)
He has history of daytime sleepiness, witness apnea, and snoring.  His BMI is 36.  He does not have history of congestive heart failure, seizure disorder, stroke, or respiratory disorder.  His home sleep study showed severe sleep apnea.  He was seen in follow up after CPAP set up with good clinical response to therapy.  I am not sure why his CPAP coverage has now been denied.  Can we have [facility] check on this.

## 2011-08-05 NOTE — Telephone Encounter (Signed)
I spoke with pt and he states his insurance would not pay for his cpap machine and had to return it. He states bc he had a home sleep study and the insurance stated he needed to have a sleep study done over at the sleep lab before they would cover his cpap machine. Please advise Dr. Craige Cotta, thanks

## 2011-08-08 NOTE — Telephone Encounter (Signed)
ORDER FAXED TO [facility] TO START CPAP ALL OVER LEFT MESSAGE FOR PT TO EXPECT A PHONE CALL FROM [facility] Humberto Seals

## 2011-08-08 NOTE — Telephone Encounter (Signed)
i have investigated this throughly and i now need an order for cpap placed in p[facility] referral que i am changing his homecare company thanks

## 2011-08-08 NOTE — Telephone Encounter (Signed)
Order for CPAP 13 cm H2O placed.

## 2011-09-10 ENCOUNTER — Ambulatory Visit (INDEPENDENT_AMBULATORY_CARE_PROVIDER_SITE_OTHER): Payer: BC Managed Care – PPO | Admitting: Pulmonary Disease

## 2011-09-10 ENCOUNTER — Encounter: Payer: Self-pay | Admitting: Pulmonary Disease

## 2011-09-10 VITALS — BP 150/82 | HR 92 | Temp 98.2°F | Ht 72.5 in | Wt 274.8 lb

## 2011-09-10 DIAGNOSIS — G4733 Obstructive sleep apnea (adult) (pediatric): Secondary | ICD-10-CM

## 2011-09-10 NOTE — Progress Notes (Signed)
Chief Complaint  Patient presents with  . Follow-up    Pt states he wears his cpap machine everynight x 5-8 hrs a night. denies any problems with machine/mask. Pt states he woke up last night feeling dizzy with the cpap on.     History of Present Illness: Howard Hays is a 36 y.o. male with OSA on CPAP 13 cm H2O.  He has a nasal mask, and this fits well.  He is not snoring any more.  He gets between 5 to 8 hours of sleep.  He uses his CPAP for the entire night.  He feels his helps his sleep and energy.  He is not having any trouble with nasal congestion, or sore throat.    He had an episode of dizziness last night after he woke up.  This lasted for a few seconds and resolved spontaneously.  This was not associated with any other symptoms.     Past Medical History  Diagnosis Date  . Hyperlipidemia   . GERD (gastroesophageal reflux disease)   . OSA (obstructive sleep apnea)     apnealink 06/26/10 [facility] 43    No past surgical history on file.  No Known Allergies  Physical Exam:  Blood pressure 150/82, pulse 92, temperature 98.2 F (36.8 C), temperature source Oral, height 6' 0.5" (1.842 m), weight 274 lb 12.8 oz (124.648 kg), SpO2 97.00%. Body mass index is 36.76 kg/(m^2).  Wt Readings from Last 2 Encounters:  09/10/11 274 lb 12.8 oz (124.648 kg)  05/06/11 263 lb (119.296 kg)    General - Obese HEENT - no sinus tenderness, no oral exudate, no LAN, EOMI, PERRLA Cardiac - s1s2 regular, no murmur Chest - no wheeze/rales Abdomen - soft, nontender Extremities - no edema Skin - no rashes Neurologic - normal strength, CN intact Psychiatric - normal mood, behavior   Assessment/Plan:  Outpatient Encounter Prescriptions as of 09/10/2011  Medication Sig Dispense Refill  . aspirin 81 MG tablet Take 81 mg by mouth daily.        Marland Kitchen atorvastatin (LIPITOR) 40 MG tablet Take 1 tablet (40 mg total) by mouth daily.  90 tablet  3  . omeprazole (PRILOSEC) 40 MG capsule Take 1 capsule (40  mg total) by mouth daily.  90 capsule  3    Howard Hays Pager:  [phone] 09/10/2011, 3:04 PM

## 2011-09-10 NOTE — Assessment & Plan Note (Signed)
He is compliant and reports benefit from CPAP.  Will get his download, and call him with results.

## 2011-09-10 NOTE — Patient Instructions (Signed)
Will get report from CPAP machine and call with results Follow up in one year 

## 2011-09-12 ENCOUNTER — Ambulatory Visit: Payer: BC Managed Care – PPO | Admitting: Internal Medicine

## 2011-09-12 ENCOUNTER — Emergency Department (HOSPITAL_COMMUNITY)
Admission: EM | Admit: 2011-09-12 | Discharge: 2011-09-12 | Disposition: A | Discharge status: Home / Self Care | Payer: BC Managed Care – PPO | Attending: Emergency Medicine | Admitting: Emergency Medicine

## 2011-09-12 ENCOUNTER — Encounter (HOSPITAL_COMMUNITY): Payer: Self-pay

## 2011-09-12 DIAGNOSIS — K219 Gastro-esophageal reflux disease without esophagitis: Secondary | ICD-10-CM | POA: Insufficient documentation

## 2011-09-12 DIAGNOSIS — R110 Nausea: Secondary | ICD-10-CM | POA: Insufficient documentation

## 2011-09-12 DIAGNOSIS — Z7982 Long term (current) use of aspirin: Secondary | ICD-10-CM | POA: Insufficient documentation

## 2011-09-12 DIAGNOSIS — Z79899 Other long term (current) drug therapy: Secondary | ICD-10-CM | POA: Insufficient documentation

## 2011-09-12 DIAGNOSIS — H8120 Vestibular neuronitis, unspecified ear: Secondary | ICD-10-CM

## 2011-09-12 DIAGNOSIS — E785 Hyperlipidemia, unspecified: Secondary | ICD-10-CM | POA: Insufficient documentation

## 2011-09-12 DIAGNOSIS — A088 Other specified intestinal infections: Secondary | ICD-10-CM | POA: Insufficient documentation

## 2011-09-12 DIAGNOSIS — A084 Viral intestinal infection, unspecified: Secondary | ICD-10-CM

## 2011-09-12 LAB — CBC
HCT: 40.8 % (ref 39.0–52.0)
Hemoglobin: 13.6 g/dL (ref 13.0–17.0)
MCH: 28.5 pg (ref 26.0–34.0)
MCHC: 33.3 g/dL (ref 30.0–36.0)
MCV: 85.4 fL (ref 78.0–100.0)
Platelets: 267 10*3/uL (ref 150–400)
RBC: 4.78 MIL/uL (ref 4.22–5.81)
RDW: 13.7 % (ref 11.5–15.5)
WBC: 6 10*3/uL (ref 4.0–10.5)

## 2011-09-12 LAB — COMPREHENSIVE METABOLIC PANEL
ALT: 43 U/L (ref 0–53)
AST: 27 U/L (ref 0–37)
Albumin: 3.8 g/dL (ref 3.5–5.2)
Alkaline Phosphatase: 80 U/L (ref 39–117)
BUN: 12 mg/dL (ref 6–23)
CO2: 27 mEq/L (ref 19–32)
Calcium: 9.4 mg/dL (ref 8.4–10.5)
Chloride: 106 mEq/L (ref 96–112)
Creatinine, Ser: 1.14 mg/dL (ref 0.50–1.35)
GFR calc Af Amer: >90 mL/min (ref >90)
GFR calc non Af Amer: 81 mL/min — ABNORMAL LOW (ref >90)
Glucose, Bld: 99 mg/dL (ref 70–99)
Potassium: 3.7 mEq/L (ref 3.5–5.1)
Sodium: 143 mEq/L (ref 135–145)
Total Bilirubin: 0.5 mg/dL (ref 0.3–1.2)
Total Protein: 7.5 g/dL (ref 6.0–8.3)

## 2011-09-12 LAB — DIFFERENTIAL
Basophils Absolute: 0 10*3/uL (ref 0.0–0.1)
Basophils Relative: 0 % (ref 0–1)
Eosinophils Absolute: 0 10*3/uL (ref 0.0–0.7)
Eosinophils Relative: 0 % (ref 0–5)
Lymphocytes Relative: 21 % (ref 12–46)
Lymphs Abs: 1.2 10*3/uL (ref 0.7–4.0)
Monocytes Absolute: 0.4 10*3/uL (ref 0.1–1.0)
Monocytes Relative: 7 % (ref 3–12)
Neutro Abs: 4.2 10*3/uL (ref 1.7–7.7)
Neutrophils Relative %: 71 % (ref 43–77)

## 2011-09-12 MED ORDER — ONDANSETRON HCL 8 MG PO TABS
4.0000 mg | ORAL_TABLET | Freq: Once | ORAL | Status: DC
  Filled 2011-09-12: qty 1

## 2011-09-12 MED ORDER — ONDANSETRON 4 MG PO TBDP
4.0000 mg | ORAL_TABLET | Freq: Once | ORAL | Status: AC
  Administered 2011-09-12: 4 mg via ORAL

## 2011-09-12 MED ORDER — MECLIZINE HCL 32 MG PO TABS: 32.0000 mg | ORAL_TABLET | Freq: Three times a day (TID) | ORAL | Status: DC | PRN

## 2011-09-12 MED ORDER — SODIUM CHLORIDE 0.9 % IV SOLN: INTRAVENOUS | Status: DC

## 2011-09-12 MED ORDER — ONDANSETRON 4 MG PO TBDP
ORAL_TABLET | ORAL | Status: AC
  Administered 2011-09-12: 4 mg via ORAL
  Filled 2011-09-12: qty 1

## 2011-09-12 MED ORDER — ONDANSETRON HCL 4 MG PO TABS: 4.0000 mg | ORAL_TABLET | Freq: Three times a day (TID) | ORAL | Status: DC | PRN

## 2011-09-12 NOTE — Discharge Instructions (Signed)
You have an infection called viral gastroenteritis, commonly known as a stomach bug.  The symptoms should improve with rest and fluids.  We are prescribing you a medication for nausea and dizziness to help with your symptoms in the short term.  You also likely have a condition called vestibular neuritis, which involves inflammation in your inner ear, causing Vertigo.  These symptoms should resolve as your stomach bug resolves.   Vertigo Vertigo means you feel like you or your surroundings are moving when they are not. Vertigo can be dangerous if it occurs when you are at work, driving, or performing difficult activities.  CAUSES  Vertigo occurs when there is a conflict of signals sent to your brain from the visual and sensory systems in your body. There are many different causes of vertigo, including:  Infections, especially in the inner ear.   A bad reaction to a drug or misuse of alcohol and medicines.   Withdrawal from drugs or alcohol.   Rapidly changing positions, such as lying down or rolling over in bed.   A migraine headache.   Decreased blood flow to the brain.   Increased pressure in the brain from a head injury, infection, tumor, or bleeding.  SYMPTOMS  You may feel as though the world is spinning around or you are falling to the ground. Because your balance is upset, vertigo can cause nausea and vomiting. You may have involuntary eye movements (nystagmus). DIAGNOSIS  Vertigo is usually diagnosed by physical exam. If the cause of your vertigo is unknown, your caregiver may perform imaging tests, such as an MRI scan (magnetic resonance imaging). TREATMENT  Most cases of vertigo resolve on their own, without treatment. Depending on the cause, your caregiver may prescribe certain medicines. If your vertigo is related to body position issues, your caregiver may recommend movements or procedures to correct the problem. In rare cases, if your vertigo is caused by certain inner ear  problems, you may need surgery. HOME CARE INSTRUCTIONS   Follow your caregiver's instructions.   Avoid driving.   Avoid operating heavy machinery.   Avoid performing any tasks that would be dangerous to you or others during a vertigo episode.   Tell your caregiver if you notice that certain medicines seem to be causing your vertigo. Some of the medicines used to treat vertigo episodes can actually make them worse in some people.  SEEK IMMEDIATE MEDICAL CARE IF:   Your medicines do not relieve your vertigo or are making it worse.   You develop problems with talking, walking, weakness, or using your arms, hands, or legs.   You develop severe headaches.   Your nausea or vomiting continues or gets worse.   You develop visual changes.   A family member notices behavioral changes.   Your condition gets worse.  MAKE SURE YOU:  Understand these instructions.   Will watch your condition.   Will get help right away if you are not doing well or get worse.  Document Released: 03/19/2005 Document Revised: 05/29/2011 Document Reviewed: 12/26/2010 [facility] Patient Information 2012 [place], [place].

## 2011-09-12 NOTE — ED Provider Notes (Signed)
History     CSN: [id number]  Arrival date & time 09/12/11  4098   First MD Initiated Contact with Patient 09/12/11 [phone]      Chief Complaint  Patient presents with  . Dizziness  . Nausea    (Consider location/radiation/quality/duration/timing/severity/associated sxs/prior treatment) HPI The patient is a 36 yo man, history of GERD, OSA, HL, presenting with nausea and 1 episode of dizziness.  Three days ago, the patient awoke in the morning with feelings of nausea and lightheadedness, which came and went throughout the day, but were generally present throughout the day, with no associated vomiting, abd pain, diarrhea, or constipation.  This morning, the patient turned to his right to get out of bed and experienced a sensation that the room was spinning for 10-15 seconds, associated with severe nausea, though no vomiting, prompting him to seek medical attention.  The patient notes that on the evening prior to the start of these symptoms he had chinese food from a restaurant he has visited before, but no other changes in diet.  He has had good PO intake over the last few days.  No fevers, chills, cough, rhinorrhea, sinus pain, ear pain, difficulty hearing, ringing in ears, fullness in ears.  The patient has a history of GERD, with an EGD in 2007 showing no PUD.  Past Medical History  Diagnosis Date  . Hyperlipidemia   . GERD (gastroesophageal reflux disease)   . OSA (obstructive sleep apnea)     apnealink 06/26/10 [facility] 43    History reviewed. No pertinent past surgical history.  Family History  Problem Relation Age of Onset  . Hyperlipidemia Other   . Stroke Paternal Grandmother   . Heart attack Paternal Grandfather   . Heart attack Maternal Grandfather   . Heart disease Paternal Uncle   . Heart disease Maternal Uncle     History  Substance Use Topics  . Smoking status: Former Smoker -- 0.5 packs/day for 10 years    Types: Cigarettes    Quit date: 01/12/2006  . Smokeless tobacco:  Not on file  . Alcohol Use: No      Review of Systems ROS General: no fevers, chills, changes in weight, changes in appetite Skin: no rash HEENT: no blurry vision, hearing changes, sore throat Pulm: no dyspnea, coughing, wheezing CV: no chest pain, palpitations, shortness of breath Abd: see HPI GU: no dysuria, hematuria, polyuria Ext: no arthralgias, myalgias Neuro: no weakness, numbness, or tingling  Allergies  Review of patient's allergies indicates no known allergies.  Home Medications   Current Outpatient Rx  Name Route Sig Dispense Refill  . ASPIRIN 81 MG PO TABS Oral Take 81 mg by mouth daily.      . ATORVASTATIN CALCIUM 40 MG PO TABS Oral Take 40 mg by mouth daily.    Marland Kitchen OMEPRAZOLE 40 MG PO CPDR Oral Take 40 mg by mouth daily.      BP 134/74  Pulse 62  Temp 98 F (36.7 C)  Resp 18  SpO2 98%  Physical Exam General: alert, cooperative, appears uncomfortable HEENT: pupils equal round and reactive to light, vision grossly intact, oropharynx clear and non-erythematous  Neck: supple, no lymphadenopathy, JVD, or carotid bruits Lungs: clear to ascultation bilaterally, normal work of respiration, no wheezes, rales, ronchi Heart: regular rate and rhythm, no murmurs, gallops, or rubs Abdomen: soft, non-tender, non-distended, normal bowel sounds Extremities: no cyanosis, clubbing, or edema Neurologic: alert & oriented X3, cranial nerves II-XII intact, strength 5/5 throughout, sensation intact to  light touch throughout, finger-to-nose intact, typical gait and heel-to-toe gait unimpaired  ED Course  Procedures (including critical care time)   Labs Reviewed  CBC  DIFFERENTIAL  COMPREHENSIVE METABOLIC PANEL   No results found.   No diagnosis found.    MDM  The patient is a 36 yo man, history of GERD and OSA, presenting with a 3-day history of nausea, and 1 episode of dizziness.  # Nausea/lightheadedness - symptoms may represent acute gastroenteritis (likely  viral) vs acute foodborne illness (given restaurant food the evening before symptom onset) vs severe GERD (less likely, patient reports no heartburn recently). -cbc, cmp -zofran for nausea  # Dizziness - symptom description is highly suggestive of vertigo, most likely vestibular neuritis (due to viral gastroenteritis) vs first-episode BPPV. -labs as above -zofran prn, consider meclizine vs benadryl if needed  # Dispo - pending labs.  Symptoms appear benign and due to likely viral gastroenteritis.  Likely discharge home with supportive care.  Signed, Janalyn Harder, PGY1 09/12/2011, 10:44 AM  12:10 pm - symptoms improved with zofran, labs normal.  Likely viral gastroenteritis + possible vestibular neuritis.  Will not treat with steroids at this time due to minor symptoms, but patient instructed to call pcp if symptoms recur.  Linward Headland, MD 09/12/11 [phone]

## 2011-09-12 NOTE — ED Notes (Signed)
Pt undressed, in gown, on monitor, continuous pulse and blood pressure cuff

## 2011-09-12 NOTE — ED Notes (Signed)
Pt states that for the past 2 mornings he has been waking up feeling very dizzy like the room is spinning, he is diaphoretic, and nauseated. Pt is alert and oriented. He denies any pain. Denies any pmh of vertigo.

## 2011-09-12 NOTE — ED Notes (Signed)
Pt back from restroom, placed back on monitor. 

## 2011-09-14 NOTE — ED Provider Notes (Signed)
I saw and evaluated the patient, reviewed the resident's note and I agree with the findings and plan.   .Face to face Exam:  General:  Awake HEENT:  Atraumatic Resp:  Normal effort Abd:  Nondistended Neuro:No focal weakness Lymph: No adenopathy   Makhari Dovidio L Marly Schuld, MD 09/14/11 1027 

## 2011-09-17 ENCOUNTER — Encounter: Payer: Self-pay | Admitting: Internal Medicine

## 2011-09-17 ENCOUNTER — Ambulatory Visit (INDEPENDENT_AMBULATORY_CARE_PROVIDER_SITE_OTHER): Payer: BC Managed Care – PPO | Admitting: Internal Medicine

## 2011-09-17 ENCOUNTER — Telehealth: Payer: Self-pay | Admitting: Pulmonary Disease

## 2011-09-17 ENCOUNTER — Other Ambulatory Visit (INDEPENDENT_AMBULATORY_CARE_PROVIDER_SITE_OTHER): Payer: BC Managed Care – PPO

## 2011-09-17 VITALS — BP 112/78 | HR 80 | Temp 98.2°F | Resp 16 | Wt 273.0 lb

## 2011-09-17 DIAGNOSIS — R42 Dizziness and giddiness: Secondary | ICD-10-CM | POA: Insufficient documentation

## 2011-09-17 DIAGNOSIS — E785 Hyperlipidemia, unspecified: Secondary | ICD-10-CM

## 2011-09-17 DIAGNOSIS — G4733 Obstructive sleep apnea (adult) (pediatric): Secondary | ICD-10-CM

## 2011-09-17 LAB — LIPID PANEL
Cholesterol: 168 mg/dL (ref 0–200)
HDL: 45.8 mg/dL (ref >39.00)
LDL Cholesterol: 95 mg/dL (ref 0–99)
Total CHOL/HDL Ratio: 4
Triglycerides: 136 mg/dL (ref 0.0–149.0)
VLDL: 27.2 mg/dL (ref 0.0–40.0)

## 2011-09-17 NOTE — Telephone Encounter (Signed)
CPAP download 08/11/11 to 09/09/11>>used on 27 of 30 nights with average 6 hrs 49 min.  Average AHI 0.4 with CPAP 13 cm H2O.  Will have my nurse inform pt that CPAP report looked very good.  No change to current CPAP set up.

## 2011-09-17 NOTE — Patient Instructions (Signed)

## 2011-09-17 NOTE — Telephone Encounter (Signed)
[id number] [id number] 

## 2011-09-17 NOTE — Assessment & Plan Note (Addendum)
This has resolved, I completed paperwork for him today for his missed work days

## 2011-09-17 NOTE — Progress Notes (Signed)
Subjective:    Patient ID: Howard Hays, male    DOB: Oct 22, 1975, 36 y.o.   MRN: [id number]  Hyperlipidemia This is a chronic problem. The current episode started more than 1 year ago. The problem is controlled. Recent lipid tests were reviewed and are variable. Exacerbating diseases include obesity. He has no history of chronic renal disease, diabetes, hypothyroidism, liver disease or nephrotic syndrome. Factors aggravating his hyperlipidemia include no known factors. Pertinent negatives include no chest pain, focal sensory loss, focal weakness, leg pain, myalgias or shortness of breath. Current antihyperlipidemic treatment includes statins. The current treatment provides moderate improvement of lipids. Compliance problems include adherence to exercise and adherence to diet.   He was seen in the ER 5 days ago for vertigo and was given some meds but he is happy to report today that the dizziness and vertigo have resolved.    Review of Systems  Constitutional: Negative.   HENT: Negative.   Eyes: Negative.   Respiratory: Negative for apnea, cough, choking, chest tightness, shortness of breath, wheezing and stridor.   Cardiovascular: Negative for chest pain, palpitations and leg swelling.  Gastrointestinal: Negative for nausea, vomiting, abdominal pain, diarrhea, constipation, blood in stool, abdominal distention and anal bleeding.  Genitourinary: Negative.   Musculoskeletal: Negative for myalgias, back pain, joint swelling, arthralgias and gait problem.  Skin: Negative for color change, pallor, rash and wound.  Neurological: Negative for dizziness, tremors, focal weakness, seizures, syncope, facial asymmetry, speech difficulty, weakness, light-headedness, numbness and headaches.  Hematological: Negative.   Psychiatric/Behavioral: Negative.        Objective:   Physical Exam  Vitals reviewed. Constitutional: He is oriented to person, place, and time. He appears well-developed and  well-nourished. No distress.  HENT:  Head: Normocephalic and atraumatic.  Mouth/Throat: Oropharynx is clear and moist. No oropharyngeal exudate.  Eyes: Conjunctivae are normal. Right eye exhibits no discharge. Left eye exhibits no discharge. No scleral icterus.  Neck: Normal range of motion. Neck supple. No JVD present. No tracheal deviation present. No thyromegaly present.  Cardiovascular: Normal rate, regular rhythm, normal heart sounds and intact distal pulses.  Exam reveals no gallop and no friction rub.   No murmur heard. Pulmonary/Chest: Effort normal and breath sounds normal. No stridor. No respiratory distress. He has no wheezes. He has no rales. He exhibits no tenderness.  Abdominal: Soft. Bowel sounds are normal. He exhibits no distension and no mass. There is no tenderness. There is no rebound and no guarding.  Musculoskeletal: Normal range of motion. He exhibits no edema and no tenderness.  Lymphadenopathy:    He has no cervical adenopathy.  Neurological: He is oriented to person, place, and time.  Skin: Skin is warm and dry. No rash noted. He is not diaphoretic. No erythema. No pallor.  Psychiatric: He has a normal mood and affect. His behavior is normal. Judgment and thought content normal.      Lab Results  Component Value Date   WBC 6.0 09/12/2011   HGB 13.6 09/12/2011   HCT 40.8 09/12/2011   PLT 267 09/12/2011   GLUCOSE 99 09/12/2011   CHOL 219* 02/19/2011   TRIG 235.0* 02/19/2011   HDL 38.80* 02/19/2011   LDLDIRECT 141.6 02/19/2011   ALT 43 09/12/2011   AST 27 09/12/2011   NA 143 09/12/2011   K 3.7 09/12/2011   CL 106 09/12/2011   CREATININE 1.14 09/12/2011   BUN 12 09/12/2011   CO2 27 09/12/2011   TSH 1.50 02/19/2011      Assessment &  Plan:

## 2011-09-17 NOTE — Assessment & Plan Note (Signed)
He is doing well on the statin I will check his FLP today 

## 2011-09-18 DIAGNOSIS — Z0279 Encounter for issue of other medical certificate: Secondary | ICD-10-CM

## 2011-09-18 NOTE — Telephone Encounter (Signed)
I spoke with patient about results and he verbalized understanding and had no questions 

## 2011-10-22 ENCOUNTER — Telehealth: Payer: Self-pay

## 2011-10-22 NOTE — Telephone Encounter (Signed)
Patient request lab results from 3/13

## 2011-10-22 NOTE — Telephone Encounter (Signed)
All normal.

## 2011-10-22 NOTE — Telephone Encounter (Signed)
Patient notified

## 2011-11-26 ENCOUNTER — Ambulatory Visit (INDEPENDENT_AMBULATORY_CARE_PROVIDER_SITE_OTHER): Payer: BC Managed Care – PPO | Admitting: Family Medicine

## 2011-11-26 ENCOUNTER — Encounter: Payer: Self-pay | Admitting: Family Medicine

## 2011-11-26 VITALS — BP 119/71 | HR 101 | Temp 98.5°F | Ht 73.0 in | Wt 276.0 lb

## 2011-11-26 DIAGNOSIS — R42 Dizziness and giddiness: Secondary | ICD-10-CM

## 2011-11-26 DIAGNOSIS — R55 Syncope and collapse: Secondary | ICD-10-CM

## 2011-11-26 LAB — POCT CBC
Granulocyte percent: 69.5 %G (ref 37–80)
HCT, POC: 42.1 % — AB (ref 43.5–53.7)
Hemoglobin: 13.3 g/dL — AB (ref 14.1–18.1)
Lymph, poc: 1.7 (ref 0.6–3.4)
MCH, POC: 27.6 pg (ref 27–31.2)
MCHC: 31.6 g/dL — AB (ref 31.8–35.4)
MCV: 87.4 fL (ref 80–97)
MID (cbc): 0.4 (ref 0–0.9)
MPV: 9.2 fL (ref 0–99.8)
POC Granulocyte: 4.9 (ref 2–6.9)
POC LYMPH PERCENT: 24.6 %L (ref 10–50)
POC MID %: 5.9 %M (ref 0–12)
Platelet Count, POC: 277 10*3/uL (ref 142–424)
RBC: 4.82 M/uL (ref 4.69–6.13)
RDW, POC: 14.4 %
WBC: 7 10*3/uL (ref 4.6–10.2)

## 2011-11-26 LAB — GLUCOSE, POCT (MANUAL RESULT ENTRY): POC Glucose: 81 mg/dl (ref 70–99)

## 2011-11-26 MED ORDER — MECLIZINE HCL 25 MG PO TABS: ORAL_TABLET | ORAL | Status: DC

## 2011-11-26 NOTE — Progress Notes (Signed)
Subjective: 36 year old Afro-American male who comes into the office with a history of about an hour ago having an acute episode. He was cooking supper, standing in the kitchen. He bent over the trash and stood back up. He get dizzy, which he thought would just be transient but it continued to get worse. He felt like he is going to pass out. He felt his heart racing and his face gets flushed and sweaty. He went in the other room and sat down and texted his friend.  He then drove himself to the office to get checked.  Couple days ago he had some symptoms of a fullness across the hypogastrium on chest. It was not pain. It did not radiate. They've gone away. He did not have those symptoms today.  He's not on any OTC medications. Does not use any drugs. He does drink a small amount of whiskey several times a week. He had some last night. He is on aspirin, atorvastatin, and omeprazole. He does work at a job but he is not scheduled to be working today.    He did have a little tingling of his left upper lip, but no slurring of speech or facial weakness.    Objective: Overweight male in no acute distress, though he looks a little anxious. TMs are normal. Eyes PERRLA. Fundi benign. Throat clear. Cranial nerves 2-12 intact. No carotid bruits. Chest is clear to auscultation. Heart regular without murmurs gallops or arrhythmias. And normal bowel sounds soft without organomegaly mass or tenderness. Extremities unremarkable. Finger to nose normal. Tandem walk normal. Gait and strength appear normal. He is alert and oriented.  EKG appears entirely normal, with normal rhythm strip.  Assessment: Acute dizziness, etiology uncertain  Plan: Check CBC and glucose and decide treatment. Results for orders placed in visit on 11/26/11  POCT CBC      Component Value Range   WBC 7.0  4.6 - 10.2 (K/uL)   Lymph, poc 1.7  0.6 - 3.4    POC LYMPH PERCENT 24.6  10 - 50 (%L)   MID (cbc) 0.4  0 - 0.9    POC MID % 5.9  0 - 12  (%M)   POC Granulocyte 4.9  2 - 6.9    Granulocyte percent 69.5  37 - 80 (%G)   RBC 4.82  4.69 - 6.13 (M/uL)   Hemoglobin 13.3 (*) 14.1 - 18.1 (g/dL)   HCT, POC 16.1 (*) 09.6 - 53.7 (%)   MCV 87.4  80 - 97 (fL)   MCH, POC 27.6  27 - 31.2 (pg)   MCHC 31.6 (*) 31.8 - 35.4 (g/dL)   RDW, POC 04.5     Platelet Count, POC 277  142 - 424 (K/uL)   MPV 9.2  0 - 99.8 (fL)  GLUCOSE, POCT (MANUAL RESULT ENTRY)      Component Value Range   POC Glucose 81  70 - 99 (mg/dl)

## 2011-11-26 NOTE — Patient Instructions (Signed)

## 2012-01-09 ENCOUNTER — Other Ambulatory Visit (INDEPENDENT_AMBULATORY_CARE_PROVIDER_SITE_OTHER): Payer: BC Managed Care – PPO

## 2012-01-09 ENCOUNTER — Encounter: Payer: Self-pay | Admitting: Internal Medicine

## 2012-01-09 ENCOUNTER — Ambulatory Visit (INDEPENDENT_AMBULATORY_CARE_PROVIDER_SITE_OTHER): Payer: BC Managed Care – PPO | Admitting: Internal Medicine

## 2012-01-09 VITALS — BP 120/84 | HR 83 | Temp 98.5°F | Resp 16 | Wt 278.0 lb

## 2012-01-09 DIAGNOSIS — D649 Anemia, unspecified: Secondary | ICD-10-CM

## 2012-01-09 DIAGNOSIS — R42 Dizziness and giddiness: Secondary | ICD-10-CM

## 2012-01-09 LAB — CBC WITH DIFFERENTIAL/PLATELET
Basophils Absolute: 0 10*3/uL (ref 0.0–0.1)
Basophils Relative: 0.2 % (ref 0.0–3.0)
Eosinophils Absolute: 0 10*3/uL (ref 0.0–0.7)
Eosinophils Relative: 0.3 % (ref 0.0–5.0)
HCT: 42.6 % (ref 39.0–52.0)
Hemoglobin: 14.1 g/dL (ref 13.0–17.0)
Lymphocytes Relative: 18.4 % (ref 12.0–46.0)
Lymphs Abs: 0.9 10*3/uL (ref 0.7–4.0)
MCHC: 33.2 g/dL (ref 30.0–36.0)
MCV: 86.4 fl (ref 78.0–100.0)
Monocytes Absolute: 0.4 10*3/uL (ref 0.1–1.0)
Monocytes Relative: 7 % (ref 3.0–12.0)
Neutro Abs: 3.7 10*3/uL (ref 1.4–7.7)
Neutrophils Relative %: 74.1 % (ref 43.0–77.0)
Platelets: 250 10*3/uL (ref 150.0–400.0)
RBC: 4.93 Mil/uL (ref 4.22–5.81)
RDW: 13.6 % (ref 11.5–14.6)
WBC: 5.1 10*3/uL (ref 4.5–10.5)

## 2012-01-09 LAB — COMPREHENSIVE METABOLIC PANEL
ALT: 53 U/L (ref 0–53)
AST: 33 U/L (ref 0–37)
Albumin: 4.2 g/dL (ref 3.5–5.2)
Alkaline Phosphatase: 77 U/L (ref 39–117)
BUN: 11 mg/dL (ref 6–23)
CO2: 25 mEq/L (ref 19–32)
Calcium: 8.9 mg/dL (ref 8.4–10.5)
Chloride: 103 mEq/L (ref 96–112)
Creatinine, Ser: 1.3 mg/dL (ref 0.4–1.5)
GFR: 81.43 mL/min (ref >60.00)
Glucose, Bld: 117 mg/dL — ABNORMAL HIGH (ref 70–99)
Potassium: 4 mEq/L (ref 3.5–5.1)
Sodium: 137 mEq/L (ref 135–145)
Total Bilirubin: 0.4 mg/dL (ref 0.3–1.2)
Total Protein: 7.9 g/dL (ref 6.0–8.3)

## 2012-01-09 LAB — VITAMIN B12: Vitamin B-12: 579 pg/mL (ref 211–911)

## 2012-01-09 LAB — IBC PANEL
Iron: 74 ug/dL (ref 42–165)
Saturation Ratios: 22.3 % (ref 20.0–50.0)
Transferrin: 236.7 mg/dL (ref 212.0–360.0)

## 2012-01-09 LAB — FOLATE: Folate: 19.9 ng/mL (ref >5.9)

## 2012-01-09 LAB — SEDIMENTATION RATE: Sed Rate: 26 mm/hr — ABNORMAL HIGH (ref 0–22)

## 2012-01-09 LAB — TSH: TSH: 1.54 u[IU]/mL (ref 0.35–5.50)

## 2012-01-09 LAB — FERRITIN: Ferritin: 156.5 ng/mL (ref 22.0–322.0)

## 2012-01-09 MED ORDER — PREDNISONE 50 MG PO TABS: ORAL_TABLET | ORAL | Status: AC

## 2012-01-09 NOTE — Progress Notes (Signed)
Subjective:    Patient ID: Howard Hays, male    DOB: 04/24/1976, 36 y.o.   MRN: [id number]  Anemia Presents for initial visit. There has been no abdominal pain, anorexia, bruising/bleeding easily, confusion, fever, leg swelling, light-headedness, malaise/fatigue, pallor, palpitations, paresthesias, pica or weight loss. Signs of blood loss that are not present include hematemesis, hematochezia and melena. Past treatments include nothing.  Today he complains of intermittent dizziness, vertigo and nausea for several months. He was seen at an [facility] and was given meclizine which helps. The vertigo has been severe this week so he has not been able to go to work.   Review of Systems  Constitutional: Negative for fever, chills, weight loss, malaise/fatigue, diaphoresis, activity change, appetite change, fatigue and unexpected weight change.  HENT: Negative.   Eyes: Negative.   Respiratory: Negative for cough, chest tightness, shortness of breath, wheezing and stridor.   Cardiovascular: Negative for chest pain, palpitations and leg swelling.  Gastrointestinal: Negative for abdominal pain, diarrhea, constipation, blood in stool, melena, hematochezia, rectal pain, anorexia and hematemesis.  Genitourinary: Negative.   Musculoskeletal: Negative.   Skin: Negative for color change, pallor and wound.  Neurological: Positive for dizziness. Negative for tremors, seizures, syncope, facial asymmetry, speech difficulty, weakness, light-headedness, numbness, headaches and paresthesias.  Hematological: Negative for adenopathy. Does not bruise/bleed easily.  Psychiatric/Behavioral: Negative.  Negative for confusion.       Objective:   Physical Exam  Vitals reviewed. Constitutional: He is oriented to person, place, and time. He appears well-developed and well-nourished.  Non-toxic appearance. He does not have a sickly appearance. He does not appear ill. No distress.  HENT:  Head: Normocephalic and atraumatic.  No trismus in the jaw.  Right Ear: Hearing, tympanic membrane, external ear and ear canal normal.  Left Ear: Hearing, tympanic membrane, external ear and ear canal normal.  Mouth/Throat: Oropharynx is clear and moist and mucous membranes are normal. Mucous membranes are not pale, not dry and not cyanotic. No uvula swelling. No oropharyngeal exudate, posterior oropharyngeal edema, posterior oropharyngeal erythema or tonsillar abscesses.  Eyes: Conjunctivae are normal. Right eye exhibits no discharge. Left eye exhibits no discharge. No scleral icterus.  Neck: Normal range of motion. Neck supple. No JVD present. No tracheal deviation present. No thyromegaly present.  Cardiovascular: Normal rate, regular rhythm, normal heart sounds and intact distal pulses.  Exam reveals no gallop and no friction rub.   No murmur heard. Pulmonary/Chest: Effort normal and breath sounds normal. No stridor. No respiratory distress. He has no wheezes. He has no rales. He exhibits no tenderness.  Abdominal: Soft. Bowel sounds are normal. He exhibits no distension and no mass. There is no tenderness. There is no rebound and no guarding.  Musculoskeletal: Normal range of motion. He exhibits no edema and no tenderness.  Lymphadenopathy:    He has no cervical adenopathy.  Neurological: He is alert and oriented to person, place, and time. He has normal strength. He is not disoriented. He displays no atrophy, no tremor and normal reflexes. No cranial nerve deficit or sensory deficit. He exhibits normal muscle tone. He displays a negative Romberg sign. He displays no seizure activity. Coordination and gait normal. He displays no Babinski's sign on the right side. He displays no Babinski's sign on the left side.  Reflex Scores:      Tricep reflexes are 1+ on the right side and 1+ on the left side.      Bicep reflexes are 1+ on the right side and 1+ on  the left side.      Brachioradialis reflexes are 1+ on the right side and 1+ on  the left side.      Patellar reflexes are 1+ on the right side and 1+ on the left side.      Achilles reflexes are 0 on the right side and 0 on the left side. Skin: Skin is warm and dry. No rash noted. He is not diaphoretic. No erythema. No pallor.  Psychiatric: He has a normal mood and affect. His behavior is normal. Judgment and thought content normal.      Lab Results  Component Value Date   WBC 7.0 11/26/2011   HGB 13.3* 11/26/2011   HCT 42.1* 11/26/2011   PLT 267 09/12/2011   GLUCOSE 99 09/12/2011   CHOL 168 09/17/2011   TRIG 136.0 09/17/2011   HDL 45.80 09/17/2011   LDLDIRECT 141.6 02/19/2011   LDLCALC 95 09/17/2011   ALT 43 09/12/2011   AST 27 09/12/2011   NA 143 09/12/2011   K 3.7 09/12/2011   CL 106 09/12/2011   CREATININE 1.14 09/12/2011   BUN 12 09/12/2011   CO2 27 09/12/2011   TSH 1.50 02/19/2011      Assessment & Plan:

## 2012-01-09 NOTE — Patient Instructions (Signed)
Vertigo Vertigo means you feel like you or your surroundings are moving when they are not. Vertigo can be dangerous if it occurs when you are at work, driving, or performing difficult activities.  CAUSES  Vertigo occurs when there is a conflict of signals sent to your brain from the visual and sensory systems in your body. There are many different causes of vertigo, including:  Infections, especially in the inner ear.   A bad reaction to a drug or misuse of alcohol and medicines.   Withdrawal from drugs or alcohol.   Rapidly changing positions, such as lying down or rolling over in bed.   A migraine headache.   Decreased blood flow to the brain.   Increased pressure in the brain from a head injury, infection, tumor, or bleeding.  SYMPTOMS  You may feel as though the world is spinning around or you are falling to the ground. Because your balance is upset, vertigo can cause nausea and vomiting. You may have involuntary eye movements (nystagmus). DIAGNOSIS  Vertigo is usually diagnosed by physical exam. If the cause of your vertigo is unknown, your caregiver may perform imaging tests, such as an MRI scan (magnetic resonance imaging). TREATMENT  Most cases of vertigo resolve on their own, without treatment. Depending on the cause, your caregiver may prescribe certain medicines. If your vertigo is related to body position issues, your caregiver may recommend movements or procedures to correct the problem. In rare cases, if your vertigo is caused by certain inner ear problems, you may need surgery. HOME CARE INSTRUCTIONS   Follow your caregiver's instructions.   Avoid driving.   Avoid operating heavy machinery.   Avoid performing any tasks that would be dangerous to you or others during a vertigo episode.   Tell your caregiver if you notice that certain medicines seem to be causing your vertigo. Some of the medicines used to treat vertigo episodes can actually make them worse in some  people.  SEEK IMMEDIATE MEDICAL CARE IF:   Your medicines do not relieve your vertigo or are making it worse.   You develop problems with talking, walking, weakness, or using your arms, hands, or legs.   You develop severe headaches.   Your nausea or vomiting continues or gets worse.   You develop visual changes.   A family member notices behavioral changes.   Your condition gets worse.  MAKE SURE YOU:  Understand these instructions.   Will watch your condition.   Will get help right away if you are not doing well or get worse.  Document Released: 03/19/2005 Document Revised: 05/29/2011 Document Reviewed: 12/26/2010 [facility] Patient Information 2012 [place], [place].Anemia, Nonspecific Your exam and blood tests show you are anemic. This means your blood (hemoglobin) level is low. Normal hemoglobin values are 12 to 15 g/dL for females and 14 to 17 g/dL for males. Make a note of your hemoglobin level today. The hematocrit percent is also used to measure anemia. A normal hematocrit is 38% to 46% in females and 42% to 49% in males. Make a note of your hematocrit level today. CAUSES  Anemia can be due to many different causes.  Excessive bleeding from periods (in women).   Intestinal bleeding.   Poor nutrition.   Kidney, thyroid, liver, and bone marrow diseases.  SYMPTOMS  Anemia can come on suddenly (acute). It can also come on slowly. Symptoms can include:  Minor weakness.   Dizziness.   Palpitations.   Shortness of breath.  Symptoms may be absent  until half your hemoglobin is missing if it comes on slowly. Anemia due to acute blood loss from an injury or internal bleeding may require blood transfusion if the loss is severe. Hospital care is needed if you are anemic and there is significant continual blood loss. TREATMENT   Stool tests for blood (Hemoccult) and additional lab tests are often needed. This determines the best treatment.   Further checking on your  condition and your response to treatment is very important. It often takes many weeks to correct anemia.  Depending on the cause, treatment can include:  Supplements of iron.   Vitamins B12 and folic acid.   Hormone medicines.If your anemia is due to bleeding, finding the cause of the blood loss is very important. This will help avoid further problems.  SEEK IMMEDIATE MEDICAL CARE IF:   You develop fainting, extreme weakness, shortness of breath, or chest pain.   You develop heavy vaginal bleeding.   You develop bloody or black, tarry stools or vomit up blood.   You develop a high fever, rash, repeated vomiting, or dehydration.  Document Released: 07/17/2004 Document Revised: 05/29/2011 Document Reviewed: 04/24/2009 [facility] Patient Information 2012 [place], [place].

## 2012-01-09 NOTE — Assessment & Plan Note (Signed)
I think he has some degree of labrynthitis so I have asked him to start prednisone as this may help

## 2012-01-09 NOTE — Assessment & Plan Note (Signed)
I will recheck his CBC today and will look at his vitamin levels and other labs to find the cause of his anemia

## 2012-01-13 ENCOUNTER — Telehealth: Payer: Self-pay | Admitting: Internal Medicine

## 2012-01-13 DIAGNOSIS — R42 Dizziness and giddiness: Secondary | ICD-10-CM

## 2012-01-13 NOTE — Telephone Encounter (Signed)
Caller: Peter/Patient; Phone Number: [phone]; Message from caller: Pt calling today 01/13/12 regarding he saw Dr.  Yetta Barre 01/09/12 for vertigo.  MD prescribed steriods, and mentioned about calling in Valium but pharmacy never received anything.  Pt said he is not having improvement.  Also wants his lab results.  Also needs note for work b/c he had to miss work again today.  PLEASE CALL PT BACK AT [phone] TO ADVISE.

## 2012-01-14 ENCOUNTER — Ambulatory Visit: Payer: BC Managed Care – PPO | Admitting: Internal Medicine

## 2012-01-14 DIAGNOSIS — Z0279 Encounter for issue of other medical certificate: Secondary | ICD-10-CM

## 2012-01-14 MED ORDER — DIAZEPAM 5 MG PO TABS: 5.0000 mg | ORAL_TABLET | Freq: Two times a day (BID) | ORAL | Status: AC | PRN

## 2012-01-14 NOTE — Telephone Encounter (Signed)
Patient notified and rx call in, he will pick up work note this afternoon

## 2012-01-14 NOTE — Telephone Encounter (Signed)
His labs look good, pls call in valium Rx and print a work note for him, he needs to see me in 2-3 weeks

## 2012-02-11 ENCOUNTER — Other Ambulatory Visit (INDEPENDENT_AMBULATORY_CARE_PROVIDER_SITE_OTHER): Payer: BC Managed Care – PPO

## 2012-02-11 ENCOUNTER — Ambulatory Visit (INDEPENDENT_AMBULATORY_CARE_PROVIDER_SITE_OTHER): Payer: BC Managed Care – PPO | Admitting: Internal Medicine

## 2012-02-11 ENCOUNTER — Encounter: Payer: Self-pay | Admitting: Internal Medicine

## 2012-02-11 VITALS — BP 122/78 | HR 83 | Temp 98.4°F | Resp 14 | Wt 280.8 lb

## 2012-02-11 DIAGNOSIS — R7309 Other abnormal glucose: Secondary | ICD-10-CM

## 2012-02-11 DIAGNOSIS — E785 Hyperlipidemia, unspecified: Secondary | ICD-10-CM

## 2012-02-11 DIAGNOSIS — E119 Type 2 diabetes mellitus without complications: Secondary | ICD-10-CM | POA: Insufficient documentation

## 2012-02-11 DIAGNOSIS — R42 Dizziness and giddiness: Secondary | ICD-10-CM

## 2012-02-11 LAB — BASIC METABOLIC PANEL
BUN: 15 mg/dL (ref 6–23)
CO2: 28 mEq/L (ref 19–32)
Calcium: 9.3 mg/dL (ref 8.4–10.5)
Chloride: 105 mEq/L (ref 96–112)
Creatinine, Ser: 1.2 mg/dL (ref 0.4–1.5)
GFR: 85.22 mL/min (ref >60.00)
Glucose, Bld: 109 mg/dL — ABNORMAL HIGH (ref 70–99)
Potassium: 4.4 mEq/L (ref 3.5–5.1)
Sodium: 139 mEq/L (ref 135–145)

## 2012-02-11 LAB — HEMOGLOBIN A1C: Hgb A1c MFr Bld: 6.3 % (ref 4.6–6.5)

## 2012-02-11 NOTE — Patient Instructions (Signed)
Vertigo Vertigo means you feel like you or your surroundings are moving when they are not. Vertigo can be dangerous if it occurs when you are at work, driving, or performing difficult activities.  CAUSES  Vertigo occurs when there is a conflict of signals sent to your brain from the visual and sensory systems in your body. There are many different causes of vertigo, including:  Infections, especially in the inner ear.   A bad reaction to a drug or misuse of alcohol and medicines.   Withdrawal from drugs or alcohol.   Rapidly changing positions, such as lying down or rolling over in bed.   A migraine headache.   Decreased blood flow to the brain.   Increased pressure in the brain from a head injury, infection, tumor, or bleeding.  SYMPTOMS  You may feel as though the world is spinning around or you are falling to the ground. Because your balance is upset, vertigo can cause nausea and vomiting. You may have involuntary eye movements (nystagmus). DIAGNOSIS  Vertigo is usually diagnosed by physical exam. If the cause of your vertigo is unknown, your caregiver may perform imaging tests, such as an MRI scan (magnetic resonance imaging). TREATMENT  Most cases of vertigo resolve on their own, without treatment. Depending on the cause, your caregiver may prescribe certain medicines. If your vertigo is related to body position issues, your caregiver may recommend movements or procedures to correct the problem. In rare cases, if your vertigo is caused by certain inner ear problems, you may need surgery. HOME CARE INSTRUCTIONS   Follow your caregiver's instructions.   Avoid driving.   Avoid operating heavy machinery.   Avoid performing any tasks that would be dangerous to you or others during a vertigo episode.   Tell your caregiver if you notice that certain medicines seem to be causing your vertigo. Some of the medicines used to treat vertigo episodes can actually make them worse in some  people.  SEEK IMMEDIATE MEDICAL CARE IF:   Your medicines do not relieve your vertigo or are making it worse.   You develop problems with talking, walking, weakness, or using your arms, hands, or legs.   You develop severe headaches.   Your nausea or vomiting continues or gets worse.   You develop visual changes.   A family member notices behavioral changes.   Your condition gets worse.  MAKE SURE YOU:  Understand these instructions.   Will watch your condition.   Will get help right away if you are not doing well or get worse.  Document Released: 03/19/2005 Document Revised: 05/29/2011 Document Reviewed: 12/26/2010 [employer] Patient Information 2012 [employer]. 

## 2012-02-11 NOTE — Progress Notes (Signed)
  Subjective:    Patient ID: Howard Hays, male    DOB: 22-Sep-1975, 36 y.o.   MRN: [id number]  HPI  He returns for f/up and complains that his intermittent dizziness and vertigo is only slightly better. He has stopped taking valium and meclizine because they did not help.  Review of Systems  Constitutional: Positive for fatigue. Negative for fever, chills, diaphoresis, activity change, appetite change and unexpected weight change.  HENT: Negative.  Negative for hearing loss, ear pain, trouble swallowing, voice change and tinnitus.   Eyes: Negative.   Respiratory: Positive for apnea. Negative for cough, choking, chest tightness, shortness of breath, wheezing and stridor.   Cardiovascular: Negative for chest pain, palpitations and leg swelling.  Gastrointestinal: Negative.   Genitourinary: Negative.   Musculoskeletal: Negative.  Negative for myalgias and arthralgias.  Skin: Negative.   Neurological: Positive for dizziness. Negative for tremors, seizures, syncope, facial asymmetry, speech difficulty, weakness, light-headedness, numbness and headaches.  Hematological: Negative for adenopathy. Does not bruise/bleed easily.  Psychiatric/Behavioral: Negative.        Objective:   Physical Exam  Vitals reviewed. Constitutional: He is oriented to person, place, and time. He appears well-developed and well-nourished. No distress.  HENT:  Head: Normocephalic and atraumatic.  Mouth/Throat: Oropharynx is clear and moist. No oropharyngeal exudate.  Eyes: Conjunctivae and EOM are normal. Pupils are equal, round, and reactive to light. Right eye exhibits no discharge. Left eye exhibits no discharge. No scleral icterus.  Neck: Normal range of motion. Neck supple. No JVD present. No tracheal deviation present. No thyromegaly present.  Cardiovascular: Normal rate, regular rhythm, normal heart sounds and intact distal pulses.  Exam reveals no gallop and no friction rub.   No murmur  heard. Pulmonary/Chest: Effort normal and breath sounds normal. No stridor. No respiratory distress. He has no wheezes. He has no rales. He exhibits no tenderness.  Abdominal: Soft. Bowel sounds are normal. He exhibits no distension and no mass. There is no tenderness. There is no rebound and no guarding.  Musculoskeletal: Normal range of motion. He exhibits no edema and no tenderness.  Lymphadenopathy:    He has no cervical adenopathy.  Neurological: He is alert and oriented to person, place, and time. He has normal reflexes. He displays normal reflexes. No cranial nerve deficit. He exhibits normal muscle tone. Coordination normal.  Skin: Skin is warm and dry. No rash noted. He is not diaphoretic. No erythema. No pallor.  Psychiatric: He has a normal mood and affect. His behavior is normal. Judgment and thought content normal.     Lab Results  Component Value Date   WBC 5.1 01/09/2012   HGB 14.1 01/09/2012   HCT 42.6 01/09/2012   PLT 250.0 01/09/2012   GLUCOSE 117* 01/09/2012   CHOL 168 09/17/2011   TRIG 136.0 09/17/2011   HDL 45.80 09/17/2011   LDLDIRECT 141.6 02/19/2011   LDLCALC 95 09/17/2011   ALT 53 01/09/2012   AST 33 01/09/2012   NA 137 01/09/2012   K 4.0 01/09/2012   CL 103 01/09/2012   CREATININE 1.3 01/09/2012   BUN 11 01/09/2012   CO2 25 01/09/2012   TSH 1.54 01/09/2012       Assessment & Plan:

## 2012-02-13 ENCOUNTER — Encounter: Payer: Self-pay | Admitting: Internal Medicine

## 2012-02-13 NOTE — Assessment & Plan Note (Signed)
This is a new diagnosis today, he does not need any meds yet but he will work on lifestyle modifications

## 2012-02-13 NOTE — Assessment & Plan Note (Signed)
He is doing well on lipitor 

## 2012-02-13 NOTE — Assessment & Plan Note (Signed)
I offered for him to see OT/PT about some rehab to help with this

## 2012-04-10 ENCOUNTER — Other Ambulatory Visit: Payer: Self-pay | Admitting: Internal Medicine

## 2012-04-13 ENCOUNTER — Encounter: Payer: Self-pay | Admitting: Family

## 2012-04-13 ENCOUNTER — Ambulatory Visit (INDEPENDENT_AMBULATORY_CARE_PROVIDER_SITE_OTHER): Payer: BC Managed Care – PPO | Admitting: Family

## 2012-04-13 VITALS — BP 120/80 | HR 83 | Temp 99.9°F | Wt 284.0 lb

## 2012-04-13 DIAGNOSIS — R51 Headache: Secondary | ICD-10-CM

## 2012-04-13 DIAGNOSIS — J029 Acute pharyngitis, unspecified: Secondary | ICD-10-CM

## 2012-04-13 LAB — POCT RAPID STREP A (OFFICE): Rapid Strep A Screen: POSITIVE — AB

## 2012-04-13 MED ORDER — AMOXICILLIN-POT CLAVULANATE 875-125 MG PO TABS: 1.0000 | ORAL_TABLET | Freq: Two times a day (BID) | ORAL | Status: DC

## 2012-04-13 NOTE — Patient Instructions (Addendum)

## 2012-04-13 NOTE — Progress Notes (Signed)
Subjective:    Patient ID: Howard Hays, male    DOB: [birth year], 36 y.o.   MRN: [id number]  HPI 36 year old [place] American male, nonsmoker, patient of Dr. Yetta Barre is in today with complaints of worsening feeling achy, having chills, headache x one day. She has not taken any medication for relief. Howard Hays denies any lightheadedness or dizziness, shortness of breath, wheezing, chest pain or palpitations. Patient has had 3 episodes of strep this year. Patient is getting married this weekend.   Review of Systems  Constitutional: Positive for chills and fatigue. Negative for fever.  HENT: Positive for sore throat. Negative for nosebleeds, congestion, rhinorrhea, sneezing and sinus pressure.   Eyes: Negative.   Respiratory: Negative.   Cardiovascular: Negative.   Gastrointestinal: Negative.   Musculoskeletal: Negative.   Skin: Negative.   Neurological: Positive for headaches. Negative for dizziness and light-headedness.  Psychiatric/Behavioral: Negative.    Past Medical History  Diagnosis Date  . Hyperlipidemia   . GERD (gastroesophageal reflux disease)   . OSA (obstructive sleep apnea)     apnealink 06/26/10 [facility] 43    History   Social History  . Marital Status: Married    Spouse Name: N/A    Number of Children: N/A  . Years of Education: N/A   Occupational History  . service dept Time [employer]   Social History Main Topics  . Smoking status: Former Smoker -- 0.5 packs/day for 10 years    Types: Cigarettes    Quit date: 01/12/2006  . Smokeless tobacco: Not on file  . Alcohol Use: No  . Drug Use: No  . Sexually Active: Yes    Birth Control/ Protection: Condom   Other Topics Concern  . Not on file   Social History Narrative   Regular exercise - yes    No past surgical history on file.  Family History  Problem Relation Age of Onset  . Hyperlipidemia Other   . Stroke Paternal Grandmother   . Heart attack Paternal Grandfather   . Heart attack Maternal Grandfather     . Heart disease Paternal Uncle   . Heart disease Maternal Uncle   . Cancer Neg Hx   . Diabetes Neg Hx   . Kidney disease Neg Hx     No Known Allergies  Current Outpatient Prescriptions on File Prior to Visit  Medication Sig Dispense Refill  . aspirin 81 MG tablet Take 81 mg by mouth daily.        Marland Kitchen atorvastatin (LIPITOR) 40 MG tablet Take 40 mg by mouth daily.      Marland Kitchen atorvastatin (LIPITOR) 40 MG tablet TAKE 1 TABLET DAILY  90 tablet  2  . omeprazole (PRILOSEC) 40 MG capsule Take 40 mg by mouth daily.      Marland Kitchen omeprazole (PRILOSEC) 40 MG capsule TAKE 1 CAPSULE DAILY  90 capsule  2    BP 120/80  Pulse 83  Temp 99.9 F (37.7 C) (Oral)  Wt 284 lb (128.822 kg)  SpO2 99%chart    Objective:   Physical Exam  Constitutional: Howard Hays is oriented to person, place, and time. Howard Hays appears well-developed and well-nourished.  HENT:  Right Ear: External ear normal.  Left Ear: External ear normal.       Pharynx moderately red, 2+ tonsils.  Neck: Normal range of motion. Neck supple.       Mild anterior cervical lymphadenopathy.  Cardiovascular: Normal rate, regular rhythm and normal heart sounds.   Pulmonary/Chest: Effort normal and breath sounds normal.  Lymphadenopathy:  Howard Hays has cervical adenopathy.  Neurological: Howard Hays is alert and oriented to person, place, and time.  Skin: Skin is warm and dry.  Psychiatric: Howard Hays has a normal mood and affect.          Assessment & Plan:  Assessment: Strep pharyngitis, Headache  Plan: Augmentin 875 one tablet twice a day x10 days. Ibuprofen as needed for pain relief. Patient call the office if symptoms worsen or persist. Recheck a schedule, and when necessary. Consider referral to ENT since this is his third episode of strep to see her.

## 2012-04-22 ENCOUNTER — Ambulatory Visit: Payer: BC Managed Care – PPO | Admitting: Internal Medicine

## 2012-06-25 ENCOUNTER — Telehealth: Payer: Self-pay | Admitting: Internal Medicine

## 2012-06-25 MED ORDER — ATORVASTATIN CALCIUM 40 MG PO TABS: 40.0000 mg | ORAL_TABLET | Freq: Every day | ORAL | Status: DC

## 2012-06-25 NOTE — Telephone Encounter (Signed)
[facility] faxed a request for atorvastatin and they got a reply saying that it was sent in via e-script but they never received anything for this patient and they are requesting [place] to call in the atorvastatin for this patient, call and use the reference number [id number]

## 2012-07-13 ENCOUNTER — Telehealth: Payer: Self-pay | Admitting: Internal Medicine

## 2012-07-13 NOTE — Telephone Encounter (Signed)
lmovm to schedule

## 2012-07-13 NOTE — Telephone Encounter (Signed)
If Dr. Yetta Barre is ok, I am willing

## 2012-07-13 NOTE — Telephone Encounter (Signed)
Patient came in stating that he would like to change pcps from Dr. Yetta Barre to Adline Mango as this office is closer to his home and his wife is a patient here also. Please advise.

## 2012-07-13 NOTE — Telephone Encounter (Signed)
That is ok with me  

## 2012-08-31 ENCOUNTER — Telehealth: Payer: Self-pay | Admitting: Pulmonary Disease

## 2012-08-31 NOTE — Telephone Encounter (Signed)
ATC patient x3. No return call back. Sent letter 08/31/12.  °

## 2012-10-20 ENCOUNTER — Encounter: Payer: Self-pay | Admitting: Family

## 2012-10-20 ENCOUNTER — Ambulatory Visit (INDEPENDENT_AMBULATORY_CARE_PROVIDER_SITE_OTHER): Payer: BC Managed Care – PPO | Admitting: Family

## 2012-10-20 VITALS — BP 120/80 | HR 76 | Wt 296.0 lb

## 2012-10-20 DIAGNOSIS — F411 Generalized anxiety disorder: Secondary | ICD-10-CM

## 2012-10-20 DIAGNOSIS — F410 Panic disorder [episodic paroxysmal anxiety] without agoraphobia: Secondary | ICD-10-CM

## 2012-10-20 DIAGNOSIS — R739 Hyperglycemia, unspecified: Secondary | ICD-10-CM

## 2012-10-20 DIAGNOSIS — E7800 Pure hypercholesterolemia, unspecified: Secondary | ICD-10-CM

## 2012-10-20 DIAGNOSIS — R7309 Other abnormal glucose: Secondary | ICD-10-CM

## 2012-10-20 LAB — COMPREHENSIVE METABOLIC PANEL
ALT: 43 U/L (ref 0–53)
AST: 40 U/L — ABNORMAL HIGH (ref 0–37)
Albumin: 3.8 g/dL (ref 3.5–5.2)
Alkaline Phosphatase: 71 U/L (ref 39–117)
BUN: 11 mg/dL (ref 6–23)
CO2: 31 mEq/L (ref 19–32)
Calcium: 9 mg/dL (ref 8.4–10.5)
Chloride: 103 mEq/L (ref 96–112)
Creatinine, Ser: 1.2 mg/dL (ref 0.4–1.5)
GFR: 86.53 mL/min (ref >60.00)
Glucose, Bld: 108 mg/dL — ABNORMAL HIGH (ref 70–99)
Potassium: 4.3 mEq/L (ref 3.5–5.1)
Sodium: 138 mEq/L (ref 135–145)
Total Bilirubin: 0.7 mg/dL (ref 0.3–1.2)
Total Protein: 7.4 g/dL (ref 6.0–8.3)

## 2012-10-20 LAB — HEPATIC FUNCTION PANEL
ALT: 43 U/L (ref 0–53)
AST: 40 U/L — ABNORMAL HIGH (ref 0–37)
Albumin: 3.8 g/dL (ref 3.5–5.2)
Alkaline Phosphatase: 71 U/L (ref 39–117)
Bilirubin, Direct: 0 mg/dL (ref 0.0–0.3)
Total Bilirubin: 0.7 mg/dL (ref 0.3–1.2)
Total Protein: 7.4 g/dL (ref 6.0–8.3)

## 2012-10-20 LAB — HEMOGLOBIN A1C: Hgb A1c MFr Bld: 6.5 % (ref 4.6–6.5)

## 2012-10-20 LAB — LIPID PANEL
Cholesterol: 216 mg/dL — ABNORMAL HIGH (ref 0–200)
HDL: 35.9 mg/dL — ABNORMAL LOW (ref >39.00)
Total CHOL/HDL Ratio: 6
Triglycerides: 448 mg/dL — ABNORMAL HIGH (ref 0.0–149.0)
VLDL: 89.6 mg/dL — ABNORMAL HIGH (ref 0.0–40.0)

## 2012-10-20 LAB — LDL CHOLESTEROL, DIRECT: Direct LDL: 107.9 mg/dL

## 2012-10-20 MED ORDER — CLONAZEPAM 0.5 MG PO TABS: 0.5000 mg | ORAL_TABLET | Freq: Two times a day (BID) | ORAL | Status: DC | PRN

## 2012-10-20 MED ORDER — PAROXETINE HCL 10 MG PO TABS: 10.0000 mg | ORAL_TABLET | ORAL | Status: DC

## 2012-10-20 NOTE — Patient Instructions (Addendum)

## 2012-10-20 NOTE — Progress Notes (Signed)
Subjective:    Patient ID: Howard Hays, male    DOB: 1975/09/27, 37 y.o.   MRN: [id number]  HPI 37 year old AAF, nonsmoker is in today with c/o increased panic attacks over the last 2-3 months. He has a history of panic attacks as a teenager. The episodes hes experiencing are much like he has experienced in the past. He describes them as an inability to swallow, followed by SOB taht last 2-3 minutes then resolves. Denies any increased stress, family history of anxiety, no stress at home. Reports he hates his job.    Review of Systems  Constitutional: Negative.   Respiratory: Negative.   Cardiovascular: Negative.   Gastrointestinal: Negative.   Endocrine: Negative.   Musculoskeletal: Negative.   Hematological: Negative.   Psychiatric/Behavioral: Negative for suicidal ideas, confusion and sleep disturbance. The patient is nervous/anxious.    Past Medical History  Diagnosis Date  . Hyperlipidemia   . GERD (gastroesophageal reflux disease)   . OSA (obstructive sleep apnea)     apnealink 06/26/10 [facility] 43    History   Social History  . Marital Status: Married    Spouse Name: N/A    Number of Children: N/A  . Years of Education: N/A   Occupational History  . service dept Time [employer]   Social History Main Topics  . Smoking status: Former Smoker -- 0.50 packs/day for 10 years    Types: Cigarettes    Quit date: 01/12/2006  . Smokeless tobacco: Not on file  . Alcohol Use: No  . Drug Use: No  . Sexually Active: Yes    Birth Control/ Protection: Condom   Other Topics Concern  . Not on file   Social History Narrative   Regular exercise - yes    History reviewed. No pertinent past surgical history.  Family History  Problem Relation Age of Onset  . Hyperlipidemia Other   . Stroke Paternal Grandmother   . Heart attack Paternal Grandfather   . Heart attack Maternal Grandfather   . Heart disease Paternal Uncle   . Heart disease Maternal Uncle   . Cancer Neg Hx   .  Diabetes Neg Hx   . Kidney disease Neg Hx     No Known Allergies  Current Outpatient Prescriptions on File Prior to Visit  Medication Sig Dispense Refill  . aspirin 81 MG tablet Take 81 mg by mouth daily.        Marland Kitchen atorvastatin (LIPITOR) 40 MG tablet TAKE 1 TABLET DAILY  90 tablet  2  . atorvastatin (LIPITOR) 40 MG tablet Take 1 tablet (40 mg total) by mouth daily.  90 tablet  3  . omeprazole (PRILOSEC) 40 MG capsule Take 40 mg by mouth daily.      Marland Kitchen omeprazole (PRILOSEC) 40 MG capsule TAKE 1 CAPSULE DAILY  90 capsule  2  . amoxicillin-clavulanate (AUGMENTIN) 875-125 MG per tablet Take 1 tablet by mouth 2 (two) times daily.  20 tablet  0   No current facility-administered medications on file prior to visit.    BP 120/80  Pulse 76  Wt 296 lb (134.265 kg)  BMI 39.06 kg/m2  SpO2 97%chart    Objective:   Physical Exam  Constitutional: He is oriented to person, place, and time. He appears well-developed and well-nourished.  HENT:  Right Ear: External ear normal.  Left Ear: External ear normal.  Nose: Nose normal.  Mouth/Throat: Oropharynx is clear and moist.  Neck: Normal range of motion. Neck supple.  Cardiovascular: Normal  rate, regular rhythm and normal heart sounds.   Pulmonary/Chest: Effort normal and breath sounds normal.  Neurological: He is alert and oriented to person, place, and time.  Skin: Skin is warm and dry.  Psychiatric: He has a normal mood and affect.          Assessment & Plan:  Assessment:  1. Panic Attacks 2. Anxiety 3. Hypercholesterolemia  Plan: Start Paxil 10mg  once a day. Klonopin as needed. Lipids, LFTs, and CMP sent. Recheck in 4 weeks and sooner as needed. Encouraged exercise.

## 2012-11-17 ENCOUNTER — Ambulatory Visit: Payer: BC Managed Care – PPO | Admitting: Family

## 2012-11-23 ENCOUNTER — Ambulatory Visit (INDEPENDENT_AMBULATORY_CARE_PROVIDER_SITE_OTHER): Payer: BC Managed Care – PPO | Admitting: Family

## 2012-11-23 ENCOUNTER — Encounter: Payer: Self-pay | Admitting: Family

## 2012-11-23 VITALS — BP 120/82 | HR 76 | Wt 286.0 lb

## 2012-11-23 DIAGNOSIS — R7309 Other abnormal glucose: Secondary | ICD-10-CM

## 2012-11-23 DIAGNOSIS — R739 Hyperglycemia, unspecified: Secondary | ICD-10-CM

## 2012-11-23 DIAGNOSIS — E7800 Pure hypercholesterolemia, unspecified: Secondary | ICD-10-CM

## 2012-11-23 DIAGNOSIS — F411 Generalized anxiety disorder: Secondary | ICD-10-CM

## 2012-11-23 LAB — LIPID PANEL
Cholesterol: 157 mg/dL (ref 0–200)
HDL: 32.3 mg/dL — ABNORMAL LOW (ref >39.00)
LDL Cholesterol: 98 mg/dL (ref 0–99)
Total CHOL/HDL Ratio: 5
Triglycerides: 132 mg/dL (ref 0.0–149.0)
VLDL: 26.4 mg/dL (ref 0.0–40.0)

## 2012-11-23 LAB — BASIC METABOLIC PANEL
BUN: 10 mg/dL (ref 6–23)
CO2: 29 mEq/L (ref 19–32)
Calcium: 9 mg/dL (ref 8.4–10.5)
Chloride: 105 mEq/L (ref 96–112)
Creatinine, Ser: 1.2 mg/dL (ref 0.4–1.5)
GFR: 89.02 mL/min (ref >60.00)
Glucose, Bld: 67 mg/dL — ABNORMAL LOW (ref 70–99)
Potassium: 3.8 mEq/L (ref 3.5–5.1)
Sodium: 140 mEq/L (ref 135–145)

## 2012-11-23 LAB — HEPATIC FUNCTION PANEL
ALT: 38 U/L (ref 0–53)
AST: 25 U/L (ref 0–37)
Albumin: 4 g/dL (ref 3.5–5.2)
Alkaline Phosphatase: 69 U/L (ref 39–117)
Bilirubin, Direct: 0 mg/dL (ref 0.0–0.3)
Total Bilirubin: 0.4 mg/dL (ref 0.3–1.2)
Total Protein: 7.8 g/dL (ref 6.0–8.3)

## 2012-11-23 MED ORDER — PAROXETINE HCL 20 MG PO TABS: 20.0000 mg | ORAL_TABLET | ORAL | Status: DC

## 2012-11-23 NOTE — Progress Notes (Signed)
Subjective:    Patient ID: Howard Hays, male    DOB: April 22, 1976, 37 y.o.   MRN: [id number]  HPI  37 year old [place] American male, nonsmoker is in today for recheck of anxiety. At his last office visit, we began Paxil 10 mg once daily. He's tolerating the medication well. His anxiety attacks have reduce to about 2 a month from nearly daily. He denies any lightheadedness, dizziness, chest pain, palpitations, shortness of breath or edema.  Patient's labs at his last office visit were found to be hyperglycemic and hypercholesterolemic. Since the office visit, patient has reduce his weight by 10 pounds. He still not exercising to try to reduce his sugar and start intake. Denies any polyuria or polydipsia  Review of Systems  Constitutional: Negative.   HENT: Negative.   Respiratory: Negative.   Cardiovascular: Negative.   Gastrointestinal: Negative.   Endocrine: Negative.  Negative for polydipsia and polyphagia.  Musculoskeletal: Negative.   Skin: Negative.   Neurological: Negative.   Hematological: Negative.   Psychiatric/Behavioral: Negative for suicidal ideas, sleep disturbance and decreased concentration. The patient is nervous/anxious.        Improved anxiety   Past Medical History  Diagnosis Date  . Hyperlipidemia   . GERD (gastroesophageal reflux disease)   . OSA (obstructive sleep apnea)     apnealink 06/26/10 [facility] 43    History   Social History  . Marital Status: Married    Spouse Name: N/A    Number of Children: N/A  . Years of Education: N/A   Occupational History  . service dept Time [employer]   Social History Main Topics  . Smoking status: Former Smoker -- 0.50 packs/day for 10 years    Types: Cigarettes    Quit date: 01/12/2006  . Smokeless tobacco: Not on file  . Alcohol Use: No  . Drug Use: No  . Sexually Active: Yes    Birth Control/ Protection: Condom   Other Topics Concern  . Not on file   Social History Narrative   Regular exercise - yes     History reviewed. No pertinent past surgical history.  Family History  Problem Relation Age of Onset  . Hyperlipidemia Other   . Stroke Paternal Grandmother   . Heart attack Paternal Grandfather   . Heart attack Maternal Grandfather   . Heart disease Paternal Uncle   . Heart disease Maternal Uncle   . Cancer Neg Hx   . Diabetes Neg Hx   . Kidney disease Neg Hx     No Known Allergies  Current Outpatient Prescriptions on File Prior to Visit  Medication Sig Dispense Refill  . aspirin 81 MG tablet Take 81 mg by mouth daily.        Marland Kitchen atorvastatin (LIPITOR) 40 MG tablet TAKE 1 TABLET DAILY  90 tablet  2  . atorvastatin (LIPITOR) 40 MG tablet Take 1 tablet (40 mg total) by mouth daily.  90 tablet  3  . clonazePAM (KLONOPIN) 0.5 MG tablet Take 1 tablet (0.5 mg total) by mouth 2 (two) times daily as needed for anxiety.  20 tablet  1  . omeprazole (PRILOSEC) 40 MG capsule Take 40 mg by mouth daily.      Marland Kitchen omeprazole (PRILOSEC) 40 MG capsule TAKE 1 CAPSULE DAILY  90 capsule  2  . PARoxetine (PAXIL) 10 MG tablet Take 1 tablet (10 mg total) by mouth every morning.  30 tablet  3  . amoxicillin-clavulanate (AUGMENTIN) 875-125 MG per tablet Take 1 tablet by mouth  2 (two) times daily.  20 tablet  0   No current facility-administered medications on file prior to visit.    BP 120/82  Pulse 76  Wt 286 lb (129.729 kg)  BMI 37.74 kg/m2  SpO2 98%chart    Objective:   Physical Exam  Constitutional: He is oriented to person, place, and time. He appears well-developed and well-nourished.  HENT:  Right Ear: External ear normal.  Left Ear: External ear normal.  Nose: Nose normal.  Mouth/Throat: Oropharynx is clear and moist.  Neck: Normal range of motion. Neck supple. No thyromegaly present.  Cardiovascular: Normal rate and normal heart sounds.   Pulmonary/Chest: Effort normal and breath sounds normal.  Abdominal: Soft. Bowel sounds are normal.  Musculoskeletal: Normal range of motion.   Neurological: He is alert and oriented to person, place, and time. He has normal reflexes.  Skin: Skin is warm and dry.  Psychiatric: He has a normal mood and affect.    .      Assessment & Plan:  Assessment:  1. Anxiety 2. Hyperglycemia 3. Hypercholesterolemia  Plan: Increase Paxil to 20 mg once daily. Continue Lipitor. Exercise to reduce weight and decreased blood sugars. Patient to call the office if symptoms worsen or persist. Recheck a schedule, and as needed.

## 2012-11-23 NOTE — Patient Instructions (Addendum)
Hypercholesterolemia High Blood Cholesterol Cholesterol is a white, waxy, fat-like protein needed by your body in small amounts. The liver makes all the cholesterol you need. It is carried from the liver by the blood through the blood vessels. Deposits (plaque) may build up on blood vessel walls. This makes the arteries narrower and stiffer. Plaque increases the risk for heart attack and stroke. You cannot feel your cholesterol level even if it is very high. The only way to know is by a blood test to check your lipid (fats) levels. Once you know your cholesterol levels, you should keep a record of the test results. Work with your caregiver to to keep your levels in the desired range. WHAT THE RESULTS MEAN:  Total cholesterol is a rough measure of all the cholesterol in your blood.  LDL is the so-called bad cholesterol. This is the type that deposits cholesterol in the walls of the arteries. You want this level to be low.  HDL is the good cholesterol because it cleans the arteries and carries the LDL away. You want this level to be high.  Triglycerides are fat that the body can either burn for energy or store. High levels are closely linked to heart disease. DESIRED LEVELS:  Total cholesterol below 200.  LDL below 100 for people at risk, below 70 for very high risk.  HDL above 50 is good, above 60 is best.  Triglycerides below 150. HOW TO LOWER YOUR CHOLESTEROL:  Diet.  Choose fish or white meat chicken and [place], roasted or baked. Limit fatty cuts of red meat, fried foods, and processed meats, such as sausage and lunch meat.  Eat lots of fresh fruits and vegetables. Choose whole grains, beans, pasta, potatoes and cereals.  Use only small amounts of olive, corn or canola oils. Avoid butter, mayonnaise, shortening or palm kernel oils. Avoid foods with trans-fats.  Use skim/nonfat milk and low-fat/nonfat yogurt and cheeses. Avoid whole milk, cream, ice cream, egg yolks and cheeses.  Healthy desserts include angel food cake, gingersnaps, animal crackers, hard candy, popsicles, and low-fat/nonfat frozen yogurt. Avoid pastries, cakes, pies and cookies.  Exercise.  A regular program helps decrease LDL and raises HDL.  Helps with weight control.  Do things that increase your activity level like gardening, walking, or taking the stairs.  Medication.  May be prescribed by your caregiver to help lowering cholesterol and the risk for heart disease.  You may need medicine even if your levels are normal if you have several risk factors. HOME CARE INSTRUCTIONS   Follow your diet and exercise programs as suggested by your caregiver.  Take medications as directed.  Have blood work done when your caregiver feels it is necessary. MAKE SURE YOU:   Understand these instructions.  Will watch your condition.  Will get help right away if you are not doing well or get worse. Document Released: 06/09/2005 Document Revised: 09/01/2011 Document Reviewed: 11/25/2006 [facility] Patient Information 2014 [place], [place].   Diabetes and Exercise Regular exercise is important and can help:   Control blood glucose (sugar).  Decrease blood pressure.    Control blood lipids (cholesterol, triglycerides).  Improve overall health. BENEFITS FROM EXERCISE  Improved fitness.  Improved flexibility.  Improved endurance.  Increased bone density.  Weight control.  Increased muscle strength.  Decreased body fat.  Improvement of the body's use of insulin, a hormone.  Increased insulin sensitivity.  Reduction of insulin needs.  Reduced stress and tension.  Helps you feel better. People with diabetes who  add exercise to their lifestyle gain additional benefits, including:  Weight loss.  Reduced appetite.  Improvement of the body's use of blood glucose.  Decreased risk factors for heart disease:  Lowering of cholesterol and triglycerides.  Raising the level of  good cholesterol (high-density lipoproteins, HDL).  Lowering blood sugar.  Decreased blood pressure. TYPE 1 DIABETES AND EXERCISE  Exercise will usually lower your blood glucose.  If blood glucose is greater than 240 mg/dl, check urine ketones. If ketones are present, do not exercise.  Location of the insulin injection sites may need to be adjusted with exercise. Avoid injecting insulin into areas of the body that will be exercised. For example, avoid injecting insulin into:  The arms when playing tennis.  The legs when jogging. For more information, discuss this with your caregiver.  Keep a record of:  Food intake.  Type and amount of exercise.  Expected peak times of insulin action.  Blood glucose levels. Do this before, during, and after exercise. Review your records with your caregiver. This will help you to develop guidelines for adjusting food intake and insulin amounts.  TYPE 2 DIABETES AND EXERCISE  Regular physical activity can help control blood glucose.  Exercise is important because it may:  Increase the body's sensitivity to insulin.  Improve blood glucose control.  Exercise reduces the risk of heart disease. It decreases serum cholesterol and triglycerides. It also lowers blood pressure.  Those who take insulin or oral hypoglycemic agents should watch for signs of hypoglycemia. These signs include dizziness, shaking, sweating, chills, and confusion.  Body water is lost during exercise. It must be replaced. This will help to avoid loss of body fluids (dehydration) or heat stroke. Be sure to talk to your caregiver before starting an exercise program to make sure it is safe for you. Remember, any activity is better than none.  Document Released: 08/30/2003 Document Revised: 09/01/2011 Document Reviewed: 12/14/2008 [facility] Patient Information 2014 [place], [place].

## 2012-11-23 NOTE — Addendum Note (Signed)
Addended byAdline Mango B on: 11/23/2012 02:59 PM   Modules accepted: Orders

## 2012-11-24 ENCOUNTER — Telehealth: Payer: Self-pay | Admitting: Family

## 2012-11-24 NOTE — Telephone Encounter (Signed)
error 

## 2012-12-14 ENCOUNTER — Encounter: Payer: Self-pay | Admitting: Family

## 2012-12-20 ENCOUNTER — Other Ambulatory Visit: Payer: Self-pay | Admitting: Family

## 2012-12-20 MED ORDER — ESCITALOPRAM OXALATE 10 MG PO TABS: 10.0000 mg | ORAL_TABLET | Freq: Every day | ORAL | Status: DC

## 2013-01-13 ENCOUNTER — Ambulatory Visit (INDEPENDENT_AMBULATORY_CARE_PROVIDER_SITE_OTHER): Payer: BC Managed Care – PPO | Admitting: Family

## 2013-01-13 ENCOUNTER — Encounter: Payer: Self-pay | Admitting: Family

## 2013-01-13 VITALS — BP 110/60 | HR 79 | Wt 286.0 lb

## 2013-01-13 DIAGNOSIS — E669 Obesity, unspecified: Secondary | ICD-10-CM

## 2013-01-13 DIAGNOSIS — F411 Generalized anxiety disorder: Secondary | ICD-10-CM

## 2013-01-13 NOTE — Progress Notes (Signed)
  Subjective:    Patient ID: Howard Hays, male    DOB: Oct 21, 1975, 37 y.o.   MRN: [id number]  HPI 37 year old [place] American male, nonsmoker is in for recheck of anxiety. Patient called in with complaint of Paxil causes drowsiness. At that point, we discontinued Paxil and started Lexapro 10 mg. He reports doing well. Denies any feelings of helplessness, hopelessness, thoughts of death or dying.   Review of Systems  Constitutional: Negative.   HENT: Negative.   Respiratory: Negative.   Cardiovascular: Negative.   Endocrine: Negative.   Skin: Negative.   Neurological: Negative.   Psychiatric/Behavioral: Negative.    Past Medical History  Diagnosis Date  . Hyperlipidemia   . GERD (gastroesophageal reflux disease)   . OSA (obstructive sleep apnea)     apnealink 06/26/10 [facility] 43    History   Social History  . Marital Status: Married    Spouse Name: N/A    Number of Children: N/A  . Years of Education: N/A   Occupational History  . service dept Time [employer]   Social History Main Topics  . Smoking status: Former Smoker -- 0.50 packs/day for 10 years    Types: Cigarettes    Quit date: 01/12/2006  . Smokeless tobacco: Not on file  . Alcohol Use: No  . Drug Use: No  . Sexually Active: Yes    Birth Control/ Protection: Condom   Other Topics Concern  . Not on file   Social History Narrative   Regular exercise - yes    History reviewed. No pertinent past surgical history.  Family History  Problem Relation Age of Onset  . Hyperlipidemia Other   . Stroke Paternal Grandmother   . Heart attack Paternal Grandfather   . Heart attack Maternal Grandfather   . Heart disease Paternal Uncle   . Heart disease Maternal Uncle   . Cancer Neg Hx   . Diabetes Neg Hx   . Kidney disease Neg Hx     No Known Allergies  Current Outpatient Prescriptions on File Prior to Visit  Medication Sig Dispense Refill  . aspirin 81 MG tablet Take 81 mg by mouth daily.        Marland Kitchen  escitalopram (LEXAPRO) 10 MG tablet Take 1 tablet (10 mg total) by mouth daily.  30 tablet  0  . omeprazole (PRILOSEC) 40 MG capsule Take 40 mg by mouth daily.      Marland Kitchen atorvastatin (LIPITOR) 40 MG tablet Take 1 tablet (40 mg total) by mouth daily.  90 tablet  3   No current facility-administered medications on file prior to visit.    BP 110/60  Pulse 79  Wt 286 lb (129.729 kg)  BMI 37.74 kg/m2  SpO2 98%chart    Objective:   Physical Exam  Constitutional: He is oriented to person, place, and time. He appears well-developed and well-nourished.  Neck: Normal range of motion. Neck supple. No thyromegaly present.  Cardiovascular: Normal rate, regular rhythm and normal heart sounds.   Pulmonary/Chest: Effort normal and breath sounds normal.  Neurological: He is alert and oriented to person, place, and time.  Skin: Skin is warm and dry.  Psychiatric: He has a normal mood and affect.          Assessment & Plan:  Assessment:  1. Anxiety 2. Obesity  Plan: Continue Lexapro 10 mg once daily. Daily exercise. Recheck in 3 months or sooner as needed.

## 2013-01-17 ENCOUNTER — Telehealth: Payer: Self-pay

## 2013-01-17 ENCOUNTER — Encounter: Payer: Self-pay | Admitting: Family

## 2013-01-17 MED ORDER — ESCITALOPRAM OXALATE 10 MG PO TABS: 10.0000 mg | ORAL_TABLET | Freq: Every day | ORAL | Status: DC

## 2013-01-17 NOTE — Telephone Encounter (Signed)
90 day supply sent to [facility]

## 2013-01-24 ENCOUNTER — Encounter: Payer: Self-pay | Admitting: Family

## 2013-01-26 ENCOUNTER — Ambulatory Visit: Payer: BC Managed Care – PPO | Admitting: Family

## 2013-01-27 ENCOUNTER — Encounter: Payer: Self-pay | Admitting: Family

## 2013-01-27 ENCOUNTER — Ambulatory Visit (INDEPENDENT_AMBULATORY_CARE_PROVIDER_SITE_OTHER): Payer: BC Managed Care – PPO | Admitting: Family

## 2013-01-27 VITALS — BP 118/76 | HR 80 | Wt 286.0 lb

## 2013-01-27 DIAGNOSIS — R002 Palpitations: Secondary | ICD-10-CM

## 2013-01-27 LAB — TSH: TSH: 1.59 u[IU]/mL (ref 0.35–5.50)

## 2013-01-27 NOTE — Patient Instructions (Addendum)
Palpitations  A palpitation is the feeling that your heartbeat is irregular or is faster than normal. It may feel like your heart is fluttering or skipping a beat. Palpitations are usually not a serious problem. However, in some cases, you may need further medical evaluation. CAUSES  Palpitations can be caused by:  Smoking.  Caffeine or other stimulants, such as diet pills or energy drinks.  Alcohol.  Stress and anxiety.  Strenuous physical activity.  Fatigue.  Certain medicines.  Heart disease, especially if you have a history of arrhythmias. This includes atrial fibrillation, atrial flutter, or supraventricular tachycardia.  An improperly working pacemaker or defibrillator. DIAGNOSIS  To find the cause of your palpitations, your caregiver will take your history and perform a physical exam. Tests may also be done, including:  Electrocardiography (ECG). This test records the heart's electrical activity.  Cardiac monitoring. This allows your caregiver to monitor your heart rate and rhythm in real time.  Holter monitor. This is a portable device that records your heartbeat and can help diagnose heart arrhythmias. It allows your caregiver to track your heart activity for several days, if needed.  Stress tests by exercise or by giving medicine that makes the heart beat faster. TREATMENT  Treatment of palpitations depends on the cause of your symptoms and can vary greatly. Most cases of palpitations do not require any treatment other than time, relaxation, and monitoring your symptoms. Other causes, such as atrial fibrillation, atrial flutter, or supraventricular tachycardia, usually require further treatment. HOME CARE INSTRUCTIONS   Avoid:  Caffeinated coffee, tea, soft drinks, diet pills, and energy drinks.  Chocolate.  Alcohol.  Stop smoking if you smoke.  Reduce your stress and anxiety. Things that can help you relax include:  A method that measures bodily functions so  you can learn to control them (biofeedback).  Yoga.  Meditation.  Physical activity such as swimming, jogging, or walking.  Get plenty of rest and sleep. SEEK MEDICAL CARE IF:   You continue to have a fast or irregular heartbeat beyond 24 hours.  Your palpitations occur more often. SEEK IMMEDIATE MEDICAL CARE IF:  You develop chest pain or shortness of breath.  You have a severe headache.  You feel dizzy, or you faint. MAKE SURE YOU:  Understand these instructions.  Will watch your condition.  Will get help right away if you are not doing well or get worse. Document Released: 06/06/2000 Document Revised: 12/09/2011 Document Reviewed: 08/08/2011 [employer] Patient Information 2014 [employer].  

## 2013-01-27 NOTE — Progress Notes (Signed)
Subjective:    Patient ID: Howard Hays, male    DOB: 03-Sep-1975, 37 y.o.   MRN: [id number]  HPI 37 year old AAM, is in today with c/o palpitations that occurred last week, Thursday and Friday. The first episode was while he was laying in bed on Thursday night. Reports feeling pressure in his chest and throat is, like a sensation to burp and was unable to. He eventually went to sleep and when he woke up the next morning the palpitations were gone. He believes this lasted approximately 15 minutes. Similar episode occurring the very next day while at work lasting 5 minutes. Reports consuming about 100 ounces of caffeine daily. Sleeps about 6 hours nightly. Was recently started on Lexapro 10 mg for anxiety. He was on Paxil previously that was ineffective.  Review of Systems  Constitutional: Negative.   HENT: Negative.   Respiratory: Negative.   Cardiovascular: Positive for palpitations. Negative for chest pain and leg swelling.  Gastrointestinal: Negative.   Endocrine: Negative.   Genitourinary: Negative.   Musculoskeletal: Negative.   Skin: Negative.   Neurological: Negative.   Psychiatric/Behavioral: Negative.    Past Medical History  Diagnosis Date  . Hyperlipidemia   . GERD (gastroesophageal reflux disease)   . OSA (obstructive sleep apnea)     apnealink 06/26/10 AHI 43    History   Social History  . Marital Status: Married    Spouse Name: N/A    Number of Children: N/A  . Years of Education: N/A   Occupational History  . service dept Time [employer]   Social History Main Topics  . Smoking status: Former Smoker -- 0.50 packs/day for 10 years    Types: Cigarettes    Quit date: 01/12/2006  . Smokeless tobacco: Not on file  . Alcohol Use: No  . Drug Use: No  . Sexually Active: Yes    Birth Control/ Protection: Condom   Other Topics Concern  . Not on file   Social History Narrative   Regular exercise - yes    History reviewed. No pertinent past surgical  history.  Family History  Problem Relation Age of Onset  . Hyperlipidemia Other   . Stroke Paternal Grandmother   . Heart attack Paternal Grandfather   . Heart attack Maternal Grandfather   . Heart disease Paternal Uncle   . Heart disease Maternal Uncle   . Cancer Neg Hx   . Diabetes Neg Hx   . Kidney disease Neg Hx     No Known Allergies  Current Outpatient Prescriptions on File Prior to Visit  Medication Sig Dispense Refill  . aspirin 81 MG tablet Take 81 mg by mouth daily.        Marland Kitchen atorvastatin (LIPITOR) 40 MG tablet Take 1 tablet (40 mg total) by mouth daily.  90 tablet  3  . escitalopram (LEXAPRO) 10 MG tablet Take 1 tablet (10 mg total) by mouth daily.  90 tablet  0  . omeprazole (PRILOSEC) 40 MG capsule Take 40 mg by mouth daily.       No current facility-administered medications on file prior to visit.    BP 118/76  Pulse 80  Wt 286 lb (129.729 kg)  BMI 37.74 kg/m2chart    Objective:   Physical Exam  Constitutional: He is oriented to person, place, and time. He appears well-developed and well-nourished.  HENT:  Right Ear: External ear normal.  Left Ear: External ear normal.  Nose: Nose normal.  Mouth/Throat: Oropharynx is clear and moist.  Neck: Normal range of motion. Neck supple. No thyromegaly present.  Cardiovascular: Normal rate, regular rhythm and normal heart sounds.   Pulmonary/Chest: Effort normal and breath sounds normal.  Musculoskeletal: Normal range of motion.  Neurological: He is alert and oriented to person, place, and time.  Skin: Skin is warm and dry.  Psychiatric: He has a normal mood and affect.     EKG: Normal sinus rhythm, within normal limits     Assessment & Plan:  Assessment:  1. Palpitations 2. Caffeine Abuse   Plan: Decrease caffeine intake gradually over the next several weeks. I strongly believe that his palpitations are related to an abundance of caffeine. Continue Lexapro 10 mg once daily. TSH sent. Will notify patient  pending results.

## 2013-02-11 ENCOUNTER — Encounter: Payer: Self-pay | Admitting: Family

## 2013-02-11 ENCOUNTER — Other Ambulatory Visit: Payer: Self-pay | Admitting: Family

## 2013-02-11 DIAGNOSIS — R002 Palpitations: Secondary | ICD-10-CM

## 2013-02-23 ENCOUNTER — Encounter (INDEPENDENT_AMBULATORY_CARE_PROVIDER_SITE_OTHER): Payer: BC Managed Care – PPO

## 2013-02-23 ENCOUNTER — Telehealth: Payer: Self-pay | Admitting: *Deleted

## 2013-02-23 DIAGNOSIS — R002 Palpitations: Secondary | ICD-10-CM

## 2013-02-23 NOTE — Telephone Encounter (Signed)
48 hr holter monitor placed on Pt 02/23/13 TK

## 2013-03-07 ENCOUNTER — Encounter: Payer: Self-pay | Admitting: Family

## 2013-03-09 ENCOUNTER — Telehealth: Payer: Self-pay | Admitting: Family

## 2013-03-09 NOTE — Telephone Encounter (Signed)
Please call cardiology and check on the status of Holter monitor report.

## 2013-03-10 ENCOUNTER — Telehealth: Payer: Self-pay | Admitting: *Deleted

## 2013-03-10 NOTE — Telephone Encounter (Signed)
Dr Orvan Falconer needs 48 hr holter results from placement date of 02/23/13.

## 2013-03-10 NOTE — Telephone Encounter (Signed)
Monitor should be read by Dr. Antoine Poche. Per Dr. Harvie Bridge nurse, she will forward message to Dr. Jenene Slicker nurse to see when report will be reviewed and signed off on. Call to be returned to update me on the status

## 2013-03-10 NOTE — Telephone Encounter (Signed)
Please let patient know what is going on.

## 2013-03-10 NOTE — Telephone Encounter (Signed)
Pt aware that I am waiting to hear back from cardiology

## 2013-03-14 ENCOUNTER — Telehealth: Payer: Self-pay

## 2013-03-21 ENCOUNTER — Other Ambulatory Visit: Payer: Self-pay | Admitting: Family

## 2013-03-21 MED ORDER — AMMONIUM LACTATE 10 % EX CREA: 10.0000 mg | TOPICAL_CREAM | Freq: Two times a day (BID) | CUTANEOUS | Status: DC

## 2013-03-25 ENCOUNTER — Other Ambulatory Visit: Payer: Self-pay | Admitting: Family

## 2013-03-25 MED ORDER — AMMONIUM LACTATE 12 % EX LOTN: TOPICAL_LOTION | CUTANEOUS | Status: DC | PRN

## 2013-04-28 ENCOUNTER — Other Ambulatory Visit: Payer: Self-pay

## 2013-07-08 ENCOUNTER — Ambulatory Visit (INDEPENDENT_AMBULATORY_CARE_PROVIDER_SITE_OTHER): Payer: BC Managed Care – PPO | Admitting: Family

## 2013-07-08 ENCOUNTER — Encounter: Payer: Self-pay | Admitting: Family

## 2013-07-08 VITALS — BP 118/76 | HR 93 | Wt 300.0 lb

## 2013-07-08 DIAGNOSIS — F411 Generalized anxiety disorder: Secondary | ICD-10-CM

## 2013-07-08 DIAGNOSIS — T148XXA Other injury of unspecified body region, initial encounter: Secondary | ICD-10-CM

## 2013-07-08 DIAGNOSIS — R071 Chest pain on breathing: Secondary | ICD-10-CM

## 2013-07-08 DIAGNOSIS — R0789 Other chest pain: Secondary | ICD-10-CM

## 2013-07-08 NOTE — Progress Notes (Signed)
Pre visit review using our clinic review tool, if applicable. No additional management support is needed unless otherwise documented below in the visit note. 

## 2013-07-08 NOTE — Progress Notes (Signed)
Subjective:    Patient ID: [name], male    DOB: 01/09/1976, 38 y.o.   MRN: [id number]  HPI  38 y.o. Black male that presents today with chief complain of pain to left rib. The pain started 1 week ago without known origin. Pain occurs when laughing or coughing; pain is to left anterior chest wall on the fourth rib; describes pain as sharp and brief; pain does not radiate; denies taking any measures to relieve pain since it "stops when I stop laughing/coughing".    Review of Systems  Constitutional: Negative.   HENT: Negative.   Eyes: Negative.   Respiratory: Negative.   Cardiovascular: Negative.   Gastrointestinal: Negative.   Endocrine: Negative.   Genitourinary: Negative.   Musculoskeletal:       Pain to left rib  Skin: Negative.   Allergic/Immunologic: Negative.   Neurological: Negative.   Hematological: Negative.   Psychiatric/Behavioral: Negative.   . Past Medical History  Diagnosis Date  . Hyperlipidemia   . GERD (gastroesophageal reflux disease)   . OSA (obstructive sleep apnea)     apnealink 06/26/10 [facility]HI 43    History   Social History  . Marital Status: Married    Spouse Name: N/A    Number of Children: N/A  . Years of Education: N/A   Occupational History  . 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 Smoking status: Former Smoker -- 0.50 packs/day for 10 years    Types: Cigarettes    Quit date: 01/12/2006  . Smokeless tobacco: Not on file  . Alcohol Use: No  . Drug Use: No  . Sexual Activity: Yes    Birth Control/ Protection: Condom   Other Topics Concern  . Not on file   Social History Narrative   Regular exercise - yes    History reviewed. No pertinent past surgical history.  Family History  Problem Relation Age of Onset  . Hyperlipidemia Other   . Stroke Paternal Grandmother   . Heart attack Paternal Grandfather   . Heart attack Maternal Grandfather   . Heart disease Paternal Uncle   . Heart disease Maternal  Uncle   . Cancer Neg Hx   . Diabetes Neg Hx   . Kidney disease Neg Hx     No Known Allergies  Current Outpatient Prescriptions on File Prior to Visit  Medication Sig Dispense Refill  . ammonium lactate (AMLACTIN) 12 % lotion Apply topically as needed for dry skin.  400 g  0  . aspirin 81 MG tablet Take 81 mg by mouth daily.        Marland Kitchen atorvastatin (LIPITOR) 40 MG tablet Take 1 tablet (40 mg total) by mouth daily.  90 tablet  3  . escitalopram (LEXAPRO) 10 MG tablet Take 1 tablet (10 mg total) by mouth daily.  90 tablet  0  . omeprazole (PRILOSEC) 40 MG capsule Take 40 mg by mouth daily.       No current facility-administered medications on file prior to visit.    BP 118/76  Pulse 93  Wt 300 lb (136.079 kg)     Objective:   Physical Exam  Constitutional: He is oriented to person, place, and time. He appears well-nourished.  HENT:  Head: Normocephalic.  Right Ear: External ear normal.  Left Ear: External ear normal.  Nose: Nose normal.  Mouth/Throat: Uvula is midline, oropharynx is clear and moist and mucous membranes are normal.  Eyes: Pupils are equal, round, and reactive  to light.  Neck: Normal range of motion.  Cardiovascular: Normal rate, regular rhythm and normal heart sounds.   Pulmonary/Chest: Effort normal and breath sounds normal.  Abdominal: Soft. Bowel sounds are normal.  Musculoskeletal: Normal range of motion.  Neurological: He is alert and oriented to person, place, and time.  Skin: Skin is warm and dry.  Psychiatric: He has a normal mood and affect.          Assessment & Plan:  -38 y.o. Male presents with pain to left anterior chest pain occuring at the fourth rib for approximately one week.  - Muscle strain- Take Ibuprofen 400mg  Q6hrs prn for pain  - Anxiety- Pt continue taking escitalopram 10mg  daily  for anxiety relief.  -Obesity- Has gained 16 pounds since last visit. Provided counseling to exercise at least 3-4 times a week, eat healthy diet.  -  Alternate heat and ice to rib as needed  - Call if pain still occuring in two weeks or if chest pain or SOB occurs.

## 2013-07-08 NOTE — Patient Instructions (Signed)

## 2013-09-11 ENCOUNTER — Other Ambulatory Visit: Payer: Self-pay | Admitting: Internal Medicine

## 2013-09-30 ENCOUNTER — Encounter: Payer: Self-pay | Admitting: Family

## 2013-09-30 ENCOUNTER — Other Ambulatory Visit: Payer: Self-pay | Admitting: Family

## 2013-09-30 MED ORDER — ATORVASTATIN CALCIUM 40 MG PO TABS: 40.0000 mg | ORAL_TABLET | Freq: Every day | ORAL | Status: DC

## 2013-10-13 ENCOUNTER — Encounter: Payer: Self-pay | Admitting: Family

## 2013-10-17 ENCOUNTER — Encounter: Payer: Self-pay | Admitting: Family

## 2013-10-17 ENCOUNTER — Ambulatory Visit (INDEPENDENT_AMBULATORY_CARE_PROVIDER_SITE_OTHER): Payer: BC Managed Care – PPO | Admitting: Family

## 2013-10-17 VITALS — BP 118/76 | HR 90 | Temp 99.1°F | Wt 295.0 lb

## 2013-10-17 DIAGNOSIS — K5900 Constipation, unspecified: Secondary | ICD-10-CM

## 2013-10-17 DIAGNOSIS — K921 Melena: Secondary | ICD-10-CM

## 2013-10-17 LAB — COMPREHENSIVE METABOLIC PANEL
ALT: 49 U/L (ref 0–53)
AST: 33 U/L (ref 0–37)
Albumin: 3.9 g/dL (ref 3.5–5.2)
Alkaline Phosphatase: 74 U/L (ref 39–117)
BUN: 13 mg/dL (ref 6–23)
CO2: 29 mEq/L (ref 19–32)
Calcium: 9.6 mg/dL (ref 8.4–10.5)
Chloride: 105 mEq/L (ref 96–112)
Creatinine, Ser: 1.2 mg/dL (ref 0.4–1.5)
GFR: 90.36 mL/min (ref >60.00)
Glucose, Bld: 103 mg/dL — ABNORMAL HIGH (ref 70–99)
Potassium: 4.5 mEq/L (ref 3.5–5.1)
Sodium: 141 mEq/L (ref 135–145)
Total Bilirubin: 0.3 mg/dL (ref 0.3–1.2)
Total Protein: 7.7 g/dL (ref 6.0–8.3)

## 2013-10-17 LAB — CBC WITH DIFFERENTIAL/PLATELET
Basophils Absolute: 0 10*3/uL (ref 0.0–0.1)
Basophils Relative: 0.2 % (ref 0.0–3.0)
Eosinophils Absolute: 0.1 10*3/uL (ref 0.0–0.7)
Eosinophils Relative: 1.4 % (ref 0.0–5.0)
HCT: 42.1 % (ref 39.0–52.0)
Hemoglobin: 13.9 g/dL (ref 13.0–17.0)
Lymphocytes Relative: 24.9 % (ref 12.0–46.0)
Lymphs Abs: 1.7 10*3/uL (ref 0.7–4.0)
MCHC: 33.1 g/dL (ref 30.0–36.0)
MCV: 87.4 fl (ref 78.0–100.0)
Monocytes Absolute: 0.6 10*3/uL (ref 0.1–1.0)
Monocytes Relative: 8.5 % (ref 3.0–12.0)
Neutro Abs: 4.3 10*3/uL (ref 1.4–7.7)
Neutrophils Relative %: 65 % (ref 43.0–77.0)
Platelets: 288 10*3/uL (ref 150.0–400.0)
RBC: 4.82 Mil/uL (ref 4.22–5.81)
RDW: 14 % (ref 11.5–14.6)
WBC: 6.7 10*3/uL (ref 4.5–10.5)

## 2013-10-17 NOTE — Progress Notes (Signed)
Pre visit review using our clinic review tool, if applicable. No additional management support is needed unless otherwise documented below in the visit note. 

## 2013-10-17 NOTE — Patient Instructions (Signed)

## 2013-10-17 NOTE — Progress Notes (Signed)
Subjective:    Patient ID: Howard Hays, male    DOB: 1976-01-10, 38 y.o.   MRN: [id number]  HPI 38 year old African American male, nonsmoker, with a shift heard is in today with complaints of blood in the stool that occurred 5 days ago with defecation. Reports being constipated. Prior to that stool, approximately 3-4 days earlier he had been had been diarrhea that he attributed to a stomach bug. He took Colace to help soften her stools have helped. Has not seen any blood since. No family history of colon cancer, diverticulitis, or any other colon issues.   Review of Systems  Constitutional: Negative.   Respiratory: Negative.   Cardiovascular: Negative.   Gastrointestinal: Positive for constipation and blood in stool.  Genitourinary: Negative.   Musculoskeletal: Negative.   Skin: Negative.   Allergic/Immunologic: Negative.   Psychiatric/Behavioral: Negative.    Past Medical History  Diagnosis Date  . Hyperlipidemia   . GERD (gastroesophageal reflux disease)   . OSA (obstructive sleep apnea)     apnealink 06/26/10 AHI 43    History   Social History  . Marital Status: Married    Spouse Name: N/A    Number of Children: N/A  . Years of Education: N/A   Occupational History  . 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 Smoking status: Former Smoker -- 0.50 packs/day for 10 years    Types: Cigarettes    Quit date: 01/12/2006  . Smokeless tobacco: Not on file  . Alcohol Use: No  . Drug Use: No  . Sexual Activity: Yes    Birth Control/ Protection: Condom   Other Topics Concern  . Not on file   Social History Narrative   Regular exercise - yes    History reviewed. No pertinent past surgical history.  Family History  Problem Relation Age of Onset  . Hyperlipidemia Other   . Stroke Paternal Grandmother   . Heart attack Paternal Grandfather   . Heart attack Maternal Grandfather   . Heart disease Paternal Uncle   . Heart disease Maternal Uncle    . Cancer Neg Hx   . Diabetes Neg Hx   . Kidney disease Neg Hx     No Known Allergies  Current Outpatient Prescriptions on File Prior to Visit  Medication Sig Dispense Refill  . ammonium lactate (AMLACTIN) 12 % lotion Apply topically as needed for dry skin.  400 g  0  . aspirin 81 MG tablet Take 81 mg by mouth daily.        Marland Kitchen atorvastatin (LIPITOR) 40 MG tablet Take 1 tablet (40 mg total) by mouth daily.  90 tablet  3  . escitalopram (LEXAPRO) 10 MG tablet TAKE 1 TABLET DAILY  90 tablet  0  . omeprazole (PRILOSEC) 40 MG capsule Take 40 mg by mouth daily.       No current facility-administered medications on file prior to visit.    BP 118/76  Pulse 90  Temp(Src) 99.1 F (37.3 C) (Oral)  Wt 295 lb (133.811 kg)  SpO2 98%chart    Objective:   Physical Exam  Constitutional: He is oriented to person, place, and time. He appears well-developed and well-nourished.  HENT:  Nose: Nose normal.  Mouth/Throat: Oropharynx is clear and moist.  Neck: Normal range of motion. Neck supple.  Cardiovascular: Normal rate, regular rhythm and normal heart sounds.   Pulmonary/Chest: Effort normal and breath sounds normal.  Abdominal: Soft. Bowel sounds are normal. There is no  rebound.  Genitourinary: Prostate normal. Guaiac negative stool.  Musculoskeletal: Normal range of motion.  Neurological: He is alert and oriented to person, place, and time.  Skin: Skin is warm and dry.  Psychiatric: He has a normal mood and affect.          Assessment & Plan:  Howard Hays was seen today for rectal bleeding.  Diagnoses and associated orders for this visit:  Blood in stool - CBC with Differential - CMP  Unspecified constipation - CBC with Differential    call the office with any questions or concerns. If CBC shows low hemoglobin, refer to GI. Otherwise it appears this is an acute issue likely due from constipation. Refer to GI if necessary.

## 2013-11-25 ENCOUNTER — Encounter: Payer: Self-pay | Admitting: Family

## 2013-11-28 ENCOUNTER — Other Ambulatory Visit: Payer: Self-pay | Admitting: Family

## 2013-11-28 DIAGNOSIS — M25569 Pain in unspecified knee: Secondary | ICD-10-CM

## 2014-01-12 ENCOUNTER — Emergency Department (HOSPITAL_COMMUNITY)
Admission: EM | Admit: 2014-01-12 | Discharge: 2014-01-13 | Disposition: A | Discharge status: Home / Self Care | Payer: BC Managed Care – PPO | Attending: Emergency Medicine | Admitting: Emergency Medicine

## 2014-01-12 ENCOUNTER — Encounter (HOSPITAL_COMMUNITY): Payer: Self-pay | Admitting: Emergency Medicine

## 2014-01-12 DIAGNOSIS — E119 Type 2 diabetes mellitus without complications: Secondary | ICD-10-CM | POA: Insufficient documentation

## 2014-01-12 DIAGNOSIS — R002 Palpitations: Secondary | ICD-10-CM

## 2014-01-12 DIAGNOSIS — F411 Generalized anxiety disorder: Secondary | ICD-10-CM | POA: Insufficient documentation

## 2014-01-12 DIAGNOSIS — Z79899 Other long term (current) drug therapy: Secondary | ICD-10-CM | POA: Insufficient documentation

## 2014-01-12 DIAGNOSIS — K219 Gastro-esophageal reflux disease without esophagitis: Secondary | ICD-10-CM | POA: Insufficient documentation

## 2014-01-12 DIAGNOSIS — E785 Hyperlipidemia, unspecified: Secondary | ICD-10-CM | POA: Insufficient documentation

## 2014-01-12 DIAGNOSIS — R42 Dizziness and giddiness: Secondary | ICD-10-CM | POA: Insufficient documentation

## 2014-01-12 DIAGNOSIS — Z7982 Long term (current) use of aspirin: Secondary | ICD-10-CM | POA: Insufficient documentation

## 2014-01-12 DIAGNOSIS — Z87891 Personal history of nicotine dependence: Secondary | ICD-10-CM | POA: Insufficient documentation

## 2014-01-12 LAB — COMPREHENSIVE METABOLIC PANEL
ALT: 44 U/L (ref 0–53)
AST: 28 U/L (ref 0–37)
Albumin: 3.7 g/dL (ref 3.5–5.2)
Alkaline Phosphatase: 82 U/L (ref 39–117)
Anion gap: 14 (ref 5–15)
BUN: 13 mg/dL (ref 6–23)
CO2: 23 mEq/L (ref 19–32)
Calcium: 8.7 mg/dL (ref 8.4–10.5)
Chloride: 107 mEq/L (ref 96–112)
Creatinine, Ser: 0.95 mg/dL (ref 0.50–1.35)
GFR calc Af Amer: >90 mL/min (ref >90)
GFR calc non Af Amer: >90 mL/min (ref >90)
Glucose, Bld: 84 mg/dL (ref 70–99)
Potassium: 4.2 mEq/L (ref 3.7–5.3)
Sodium: 144 mEq/L (ref 137–147)
Total Bilirubin: 0.2 mg/dL — ABNORMAL LOW (ref 0.3–1.2)
Total Protein: 7.7 g/dL (ref 6.0–8.3)

## 2014-01-12 LAB — CBC WITH DIFFERENTIAL/PLATELET
Basophils Absolute: 0 10*3/uL (ref 0.0–0.1)
Basophils Relative: 0 % (ref 0–1)
Eosinophils Absolute: 0.1 10*3/uL (ref 0.0–0.7)
Eosinophils Relative: 2 % (ref 0–5)
HCT: 42.2 % (ref 39.0–52.0)
Hemoglobin: 14.1 g/dL (ref 13.0–17.0)
Lymphocytes Relative: 33 % (ref 12–46)
Lymphs Abs: 2.4 10*3/uL (ref 0.7–4.0)
MCH: 29.1 pg (ref 26.0–34.0)
MCHC: 33.4 g/dL (ref 30.0–36.0)
MCV: 87.2 fL (ref 78.0–100.0)
Monocytes Absolute: 0.5 10*3/uL (ref 0.1–1.0)
Monocytes Relative: 7 % (ref 3–12)
Neutro Abs: 4.1 10*3/uL (ref 1.7–7.7)
Neutrophils Relative %: 58 % (ref 43–77)
Platelets: 268 10*3/uL (ref 150–400)
RBC: 4.84 MIL/uL (ref 4.22–5.81)
RDW: 13.8 % (ref 11.5–15.5)
WBC: 7.1 10*3/uL (ref 4.0–10.5)

## 2014-01-12 LAB — TROPONIN I: Troponin I: <0.3 ng/mL (ref <0.30)

## 2014-01-12 NOTE — ED Notes (Signed)
Pt. reports intermittent palpitations with dizziness for 2 weeks , denies chest pain  / no SOB or nausea. Currently taking Meclizine prescribed for vertigo.

## 2014-01-12 NOTE — ED Provider Notes (Signed)
CSN: [id number]     Arrival date & time 01/12/14  2018 History   First MD Initiated Contact with Patient 01/12/14 2300     Chief Complaint  Patient presents with  . Palpitations     (Consider location/radiation/quality/duration/timing/severity/associated sxs/prior Treatment) HPI 38 year old male presents to emergency department from home with complaint of 2 weeks of palpitations.  He describes palpitations as skipping a beat followed by a heartbeat.  This is been constant.  Patient reports over the last 3 days he has had a sensation that he was about to develop vertigo.  Patient reports symptoms are mainly in the morning when he rolls to the left.  Has history of same in the past.  Patient was seen on Wednesday at urgent care, and started on meclizine.  Patient has been on meclizine for 2 days.  Patient reports while driving today he was talking on the phone th through his right ear tooth within a deep signal to.  He reports that the deep made him feel as if someone had jumped out and scared him.  He denies having a sense of fear or panic, but reports that his body reacts as though he had been scared.  This lasted a few minutes.  This happened one other time, and he switched to blue tooth to his other ear to see if that would help with symptoms.  Tonight just prior to arrival he was watching TV and a loud seen came on which again gave him a sense that his body has been scared.  He reports since starting the meclizine, however he has had no further palpitations.  Patient reports streaking 2-3 cans of caffeinated beverages a day.  Patient has history of sleep apnea and uses a CPAP at night.  He denies any chest pain no drug use.  Patient has been doing home exercises for vertigo without improvement.  He reports despite being on meclizine, he still has some vertiginous symptoms in the morning when rolling to the left. Past Medical History  Diagnosis Date  . Hyperlipidemia   . GERD (gastroesophageal  reflux disease)   . OSA (obstructive sleep apnea)     apnealink 06/26/10 AHI 43  . Diabetes mellitus without complication    History reviewed. No pertinent past surgical history. Family History  Problem Relation Age of Onset  . Hyperlipidemia Other   . Stroke Paternal Grandmother   . Heart attack Paternal Grandfather   . Heart attack Maternal Grandfather   . Heart disease Paternal Uncle   . Heart disease Maternal Uncle   . Cancer Neg Hx   . Diabetes Neg Hx   . Kidney disease Neg Hx    History  Substance Use Topics  . Smoking status: Former Smoker -- 0.50 packs/day for 10 years    Types: Cigarettes    Quit date: 01/12/2006  . Smokeless tobacco: Not on file  . Alcohol Use: No    Review of Systems   See History of Present Illness; otherwise all other systems are reviewed and negative  Allergies  Review of patient's allergies indicates no known allergies.  Home Medications   Prior to Admission medications   Medication Sig Start Date End Date Taking? Authorizing Provider  ammonium lactate (AMLACTIN) 12 % lotion Apply topically as needed for dry skin. 03/25/13   Timoteo Gaul, FNP  aspirin 81 MG tablet Take 81 mg by mouth daily.      Historical Provider, MD  atorvastatin (LIPITOR) 40 MG tablet Take 1 tablet (  40 mg total) by mouth daily. 09/30/13   Timoteo Gaul, FNP  escitalopram (LEXAPRO) 10 MG tablet TAKE 1 TABLET DAILY 09/30/13   Timoteo Gaul, FNP  omeprazole (PRILOSEC) 40 MG capsule Take 40 mg by mouth daily.    Historical Provider, MD   BP 141/86  Pulse 83  Temp(Src) 98.3 F (36.8 C) (Oral)  Resp 18  SpO2 98% Physical Exam  Nursing note and vitals reviewed. Constitutional: He is oriented to person, place, and time. He appears well-developed and well-nourished.  HENT:  Head: Normocephalic and atraumatic.  Right Ear: External ear normal.  Left Ear: External ear normal.  Nose: Nose normal.  Mouth/Throat: Oropharynx is clear and moist.  Eyes:  Conjunctivae and EOM are normal. Pupils are equal, round, and reactive to light.  Neck: Normal range of motion. Neck supple. No JVD present. No tracheal deviation present. No thyromegaly present.  Cardiovascular: Normal rate, regular rhythm, normal heart sounds and intact distal pulses.  Exam reveals no gallop and no friction rub.   No murmur heard. Pulmonary/Chest: Effort normal and breath sounds normal. No stridor. No respiratory distress. He has no wheezes. He has no rales. He exhibits no tenderness.  Abdominal: Soft. Bowel sounds are normal. He exhibits no distension and no mass. There is no tenderness. There is no rebound and no guarding.  Musculoskeletal: Normal range of motion. He exhibits no edema and no tenderness.  Lymphadenopathy:    He has no cervical adenopathy.  Neurological: He is alert and oriented to person, place, and time. He has normal reflexes. No cranial nerve deficit. He exhibits normal muscle tone. Coordination normal.  Skin: Skin is warm and dry. No rash noted. No erythema. No pallor.  Psychiatric: He has a normal mood and affect. His behavior is normal. Judgment and thought content normal.    ED Course  Procedures (including critical care time) Labs Review Labs Reviewed  COMPREHENSIVE METABOLIC PANEL - Abnormal; Notable for the following:    Total Bilirubin 0.2 (*)    All other components within normal limits  CBC WITH DIFFERENTIAL  TROPONIN I    Imaging Review No results found.   EKG Interpretation   Date/Time:  Thursday January 12 2014 20:33:17 EDT Ventricular Rate:  84 PR Interval:  164 QRS Duration: 84 QT Interval:  366 QTC Calculation: 432 R Axis:   66 Text Interpretation:  Normal sinus rhythm Normal ECG Confirmed by Amna Welker   MD, Gisel Vipond ([id number]) on 01/12/2014 11:03:46 PM      MDM   Final diagnoses:  Vertigo  Palpitations    38 yo male with 2 weeks of palpitations, history of same in past with normal Holter monitor, recent episodes of vertigo  also history of same in past now with new exaggerated physical response to loud noises.  Unsure if this is secondary to recent start meclizine, patient does have history of anxiety, some Lexapro for same.  No physical findings noted.  Unable to stimulate vertigo with a head impulse testing, patient denies vertigo at present.  No palpitations are present no anxiety or feelings of being scared are present.    Kalman Drape, MD 01/13/14 [phone]

## 2014-01-12 NOTE — Discharge Instructions (Signed)
Stop using meclizine as it may be contributing to your startle reaction.  Continue using vertigo exercises.  If not improved, follow up with ENT.  Return to the ER for worsening condition or new concerning symptoms.   Epley Maneuver Self-Care WHAT IS THE EPLEY MANEUVER? The Epley maneuver is an exercise you can do to relieve symptoms of benign paroxysmal positional vertigo (BPPV). This condition is often just referred to as vertigo. BPPV is caused by the movement of tiny crystals (canaliths) inside your inner ear. The accumulation and movement of canaliths in your inner ear causes a sudden spinning sensation (vertigo) when you move your head to certain positions. Vertigo usually lasts about 30 seconds. BPPV usually occurs in just one ear. If you get vertigo when you lie on your left side, you probably have BPPV in your left ear. Your health care provider can tell you which ear is involved.  BPPV may be caused by a head injury. Many people older than 50 get BPPV for unknown reasons. If you have been diagnosed with BPPV, your health care provider may teach you how to do this maneuver. BPPV is not life threatening (benign) and usually goes away in time.  WHEN SHOULD I PERFORM THE EPLEY MANEUVER? You can do this maneuver at home whenever you have symptoms of vertigo. You may do the Epley maneuver up to 3 times a day until your symptoms of vertigo go away. HOW SHOULD I DO THE EPLEY MANEUVER? 1. Sit on the edge of a bed or table with your back straight. Your legs should be extended or hanging over the edge of the bed or table.  2. Turn your head halfway toward the affected ear.  3. Lie backward quickly with your head turned until you are lying flat on your back. You may want to position a pillow under your shoulders.  4. Hold this position for 30 seconds. You may experience an attack of vertigo. This is normal. Hold this position until the vertigo stops. 5. Then turn your head to the opposite direction  until your unaffected ear is facing the floor.  6. Hold this position for 30 seconds. You may experience an attack of vertigo. This is normal. Hold this position until the vertigo stops. 7. Now turn your whole body to the same side as your head. Hold for another 30 seconds.  8. You can then sit back up. ARE THERE RISKS TO THIS MANEUVER? In some cases, you may have other symptoms (such as changes in your vision, weakness, or numbness). If you have these symptoms, stop doing the maneuver and call your health care provider. Even if doing these maneuvers relieves your vertigo, you may still have dizziness. Dizziness is the sensation of light-headedness but without the sensation of movement. Even though the Epley maneuver may relieve your vertigo, it is possible that your symptoms will return within 5 years. WHAT SHOULD I DO AFTER THIS MANEUVER? After doing the Epley maneuver, you can return to your normal activities. Ask your doctor if there is anything you should do at home to prevent vertigo. This may include:  Sleeping with two or more pillows to keep your head elevated.  Not sleeping on the side of your affected ear.  Getting up slowly from bed.  Avoiding sudden movements during the day.  Avoiding extreme head movement, like looking up or bending over.  Wearing a cervical collar to prevent sudden head movements. WHAT SHOULD I DO IF MY SYMPTOMS GET WORSE? Call your health  care provider if your vertigo gets worse. Call your provider right way if you have other symptoms, including:   Nausea.  Vomiting.  Headache.  Weakness.  Numbness.  Vision changes. Document Released: 06/14/2013 Document Reviewed: 06/14/2013 [facility] Patient Information 2015 [place], [place]. This information is not intended to replace advice given to you by your health care provider. Make sure you discuss any questions you have with your health care provider.   Palpitations A palpitation is the feeling that  your heartbeat is irregular or is faster than normal. It may feel like your heart is fluttering or skipping a beat. Palpitations are usually not a serious problem. However, in some cases, you may need further medical evaluation. CAUSES  Palpitations can be caused by:  Smoking.  Caffeine or other stimulants, such as diet pills or energy drinks.  Alcohol.  Stress and anxiety.  Strenuous physical activity.  Fatigue.  Certain medicines.  Heart disease, especially if you have a history of irregular heart rhythms (arrhythmias), such as atrial fibrillation, atrial flutter, or supraventricular tachycardia.  An improperly working pacemaker or defibrillator. DIAGNOSIS  To find the cause of your palpitations, your health care provider will take your medical history and perform a physical exam. Your health care provider may also have you take a test called an ambulatory electrocardiogram (ECG). An ECG records your heartbeat patterns over a 24-hour period. You may also have other tests, such as:  Transthoracic echocardiogram (TTE). During echocardiography, sound waves are used to evaluate how blood flows through your heart.  Transesophageal echocardiogram (TEE).  Cardiac monitoring. This allows your health care provider to monitor your heart rate and rhythm in real time.  Holter monitor. This is a portable device that records your heartbeat and can help diagnose heart arrhythmias. It allows your health care provider to track your heart activity for several days, if needed.  Stress tests by exercise or by giving medicine that makes the heart beat faster. TREATMENT  Treatment of palpitations depends on the cause of your symptoms and can vary greatly. Most cases of palpitations do not require any treatment other than time, relaxation, and monitoring your symptoms. Other causes, such as atrial fibrillation, atrial flutter, or supraventricular tachycardia, usually require further treatment. HOME  CARE INSTRUCTIONS   Avoid:  Caffeinated coffee, tea, soft drinks, diet pills, and energy drinks.  Chocolate.  Alcohol.  Stop smoking if you smoke.  Reduce your stress and anxiety. Things that can help you relax include:  A method of controlling things in your body, such as your heartbeats, with your mind (biofeedback).  Yoga.  Meditation.  Physical activity such as swimming, jogging, or walking.  Get plenty of rest and sleep. SEEK MEDICAL CARE IF:   You continue to have a fast or irregular heartbeat beyond 24 hours.  Your palpitations occur more often. SEEK IMMEDIATE MEDICAL CARE IF:  You have chest pain or shortness of breath.  You have a severe headache.  You feel dizzy or you faint. MAKE SURE YOU:  Understand these instructions.  Will watch your condition.  Will get help right away if you are not doing well or get worse. Document Released: 06/06/2000 Document Revised: 06/14/2013 Document Reviewed: 08/08/2011 [facility] Patient Information 2015 [place], [place]. This information is not intended to replace advice given to you by your health care provider. Make sure you discuss any questions you have with your health care provider.  Vertigo Vertigo means you feel like you or your surroundings are moving when they are not. Vertigo  can be dangerous if it occurs when you are at work, driving, or performing difficult activities.  CAUSES  Vertigo occurs when there is a conflict of signals sent to your brain from the visual and sensory systems in your body. There are many different causes of vertigo, including:  Infections, especially in the inner ear.  A bad reaction to a drug or misuse of alcohol and medicines.  Withdrawal from drugs or alcohol.  Rapidly changing positions, such as lying down or rolling over in bed.  A migraine headache.  Decreased blood flow to the brain.  Increased pressure in the brain from a head injury, infection, tumor, or  bleeding. SYMPTOMS  You may feel as though the world is spinning around or you are falling to the ground. Because your balance is upset, vertigo can cause nausea and vomiting. You may have involuntary eye movements (nystagmus). DIAGNOSIS  Vertigo is usually diagnosed by physical exam. If the cause of your vertigo is unknown, your caregiver may perform imaging tests, such as an MRI scan (magnetic resonance imaging). TREATMENT  Most cases of vertigo resolve on their own, without treatment. Depending on the cause, your caregiver may prescribe certain medicines. If your vertigo is related to body position issues, your caregiver may recommend movements or procedures to correct the problem. In rare cases, if your vertigo is caused by certain inner ear problems, you may need surgery. HOME CARE INSTRUCTIONS   Follow your caregiver's instructions.  Avoid driving.  Avoid operating heavy machinery.  Avoid performing any tasks that would be dangerous to you or others during a vertigo episode.  Tell your caregiver if you notice that certain medicines seem to be causing your vertigo. Some of the medicines used to treat vertigo episodes can actually make them worse in some people. SEEK IMMEDIATE MEDICAL CARE IF:   Your medicines do not relieve your vertigo or are making it worse.  You develop problems with talking, walking, weakness, or using your arms, hands, or legs.  You develop severe headaches.  Your nausea or vomiting continues or gets worse.  You develop visual changes.  A family member notices behavioral changes.  Your condition gets worse. MAKE SURE YOU:  Understand these instructions.  Will watch your condition.  Will get help right away if you are not doing well or get worse. Document Released: 03/19/2005 Document Revised: 09/01/2011 Document Reviewed: 12/26/2010 [facility] Patient Information 2015 [place], [place]. This information is not intended to replace advice given to you by  your health care provider. Make sure you discuss any questions you have with your health care provider.

## 2014-01-12 NOTE — ED Notes (Signed)
Pt denies palpations at this time.

## 2014-01-13 ENCOUNTER — Other Ambulatory Visit: Payer: Self-pay | Admitting: Family

## 2014-01-13 ENCOUNTER — Encounter: Payer: Self-pay | Admitting: Family

## 2014-01-13 DIAGNOSIS — H9190 Unspecified hearing loss, unspecified ear: Secondary | ICD-10-CM

## 2014-01-13 NOTE — ED Notes (Signed)
Pt denied wheelchair,  Ambulatory to discharge

## 2014-01-19 ENCOUNTER — Encounter: Payer: Self-pay | Admitting: Family

## 2014-01-19 ENCOUNTER — Ambulatory Visit (INDEPENDENT_AMBULATORY_CARE_PROVIDER_SITE_OTHER): Payer: BC Managed Care – PPO | Admitting: Family

## 2014-01-19 VITALS — BP 124/80 | HR 80 | Temp 99.3°F | Ht 73.0 in | Wt 294.0 lb

## 2014-01-19 DIAGNOSIS — E663 Overweight: Secondary | ICD-10-CM

## 2014-01-19 DIAGNOSIS — R002 Palpitations: Secondary | ICD-10-CM

## 2014-01-19 DIAGNOSIS — E7800 Pure hypercholesterolemia, unspecified: Secondary | ICD-10-CM

## 2014-01-19 MED ORDER — METOPROLOL SUCCINATE ER 25 MG PO TB24
25.0000 mg | ORAL_TABLET | Freq: Every day | ORAL | Status: DC
Start: 2014-01-19 — End: 2014-02-21

## 2014-01-19 NOTE — Progress Notes (Signed)
Pre visit review using our clinic review tool, if applicable. No additional management support is needed unless otherwise documented below in the visit note. 

## 2014-01-19 NOTE — Patient Instructions (Signed)

## 2014-01-19 NOTE — Progress Notes (Signed)
Subjective:    Patient ID: Howard Hays, male    DOB: 25-May-1976, 38 y.o.   MRN: [id number]  HPI 38 year old African American male, nonsmoker with a history of type 2 diabetes, hyperlipidemia, and overweight is in today with persistent complaints of palpitations. He was seen in urgent care clinic after having a bout of vertigo on 01/11/2014. Was started on meclizine that he believes stopped his palpitations but caused him to be oversensitive. Reports nearly wrecking his care when he heard a beep in his ear from his bluetooth.  He went to the emergency department on 01/12/2014 was advised not to take meclizine. Thereafter the palpitations returned. He is worn a Holter monitor that showed PVCs. However, the palpitations have increased in frequency. He has a remote history of anxiety. Report stress worrying about his heart.    Review of Systems  Constitutional: Negative.   HENT: Negative.   Respiratory: Negative.  Negative for shortness of breath and wheezing.   Cardiovascular: Positive for palpitations. Negative for chest pain and leg swelling.  Gastrointestinal: Negative.   Endocrine: Negative.   Genitourinary: Negative.   Musculoskeletal: Negative.   Skin: Negative.   Neurological: Negative.   Psychiatric/Behavioral: Negative for suicidal ideas, sleep disturbance and decreased concentration. The patient is nervous/anxious.    Past Medical History  Diagnosis Date  . Hyperlipidemia   . GERD (gastroesophageal reflux disease)   . OSA (obstructive sleep apnea)     apnealink 06/26/10 AHI 43  . Diabetes mellitus without complication     History   Social History  . Marital Status: Married    Spouse Name: N/A    Number of Children: N/A  . Years of Education: N/A   Occupational History  . 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 Smoking status: Former Smoker -- 0.50 packs/day for 10 years    Types: Cigarettes    Quit date: 01/12/2006  . Smokeless tobacco:  Not on file  . Alcohol Use: No  . Drug Use: No  . Sexual Activity: Yes    Birth Control/ Protection: Condom   Other Topics Concern  . Not on file   Social History Narrative   Regular exercise - yes    History reviewed. No pertinent past surgical history.  Family History  Problem Relation Age of Onset  . Hyperlipidemia Other   . Stroke Paternal Grandmother   . Heart attack Paternal Grandfather   . Heart attack Maternal Grandfather   . Heart disease Paternal Uncle   . Heart disease Maternal Uncle   . Cancer Neg Hx   . Diabetes Neg Hx   . Kidney disease Neg Hx     No Known Allergies  Current Outpatient Prescriptions on File Prior to Visit  Medication Sig Dispense Refill  . ammonium lactate (AMLACTIN) 12 % lotion Apply topically as needed for dry skin.  400 g  0  . aspirin 81 MG tablet Take 81 mg by mouth daily.        Marland Kitchen atorvastatin (LIPITOR) 40 MG tablet Take 1 tablet (40 mg total) by mouth daily.  90 tablet  3  . escitalopram (LEXAPRO) 10 MG tablet TAKE 1 TABLET DAILY  90 tablet  0   No current facility-administered medications on file prior to visit.    BP 124/80  Pulse 80  Temp(Src) 99.3 F (37.4 C) (Oral)  Ht 6\' 1"  (1.854 m)  Wt 294 lb (133.358 kg)  BMI 38.80 kg/m2chart    Objective:  Physical Exam  Constitutional: He is oriented to person, place, and time. He appears well-developed and well-nourished.  HENT:  Right Ear: External ear normal.  Left Ear: External ear normal.  Nose: Nose normal.  Mouth/Throat: Oropharynx is clear and moist.  Neck: Normal range of motion. Neck supple.  Cardiovascular: Normal rate and normal heart sounds.   No murmur heard. PACs  Pulmonary/Chest: Effort normal and breath sounds normal.  Abdominal: Soft. Bowel sounds are normal.  Musculoskeletal: Normal range of motion.  Neurological: He is alert and oriented to person, place, and time.  Skin: Skin is warm and dry.  Psychiatric: He has a normal mood and affect.           Assessment & Plan:  Oneal was seen today for follow-up.  Diagnoses and associated orders for this visit:  Heart palpitations - EKG 12-Lead - Ambulatory referral to Cardiology  Pure hypercholesterolemia  Overweight  Other Orders - metoprolol succinate (TOPROL-XL) 25 MG 24 hr tablet; Take 1 tablet (25 mg total) by mouth daily.   Call the office with any questions or concerns. Recheck as scheduled and as needed. Follow-up  with cardiology on Friday as noted.

## 2014-01-20 ENCOUNTER — Encounter: Payer: Self-pay | Admitting: Cardiology

## 2014-01-20 ENCOUNTER — Ambulatory Visit (INDEPENDENT_AMBULATORY_CARE_PROVIDER_SITE_OTHER): Payer: BC Managed Care – PPO | Admitting: Cardiology

## 2014-01-20 ENCOUNTER — Encounter: Payer: Self-pay | Admitting: *Deleted

## 2014-01-20 VITALS — BP 138/76 | HR 70 | Ht 73.0 in | Wt 294.4 lb

## 2014-01-20 DIAGNOSIS — E785 Hyperlipidemia, unspecified: Secondary | ICD-10-CM

## 2014-01-20 DIAGNOSIS — R002 Palpitations: Secondary | ICD-10-CM

## 2014-01-20 LAB — TSH: TSH: 1.18 u[IU]/mL (ref 0.35–4.50)

## 2014-01-20 NOTE — Patient Instructions (Signed)
Your physician recommends that you have lab work today--TSH.  Your physician has requested that you have an echocardiogram. Echocardiography is a painless test that uses sound waves to create images of your heart. It provides your doctor with information about the size and shape of your heart and how well your heart's chambers and valves are working. This procedure takes approximately one hour. There are no restrictions for this procedure.  Your physician recommends that you schedule a follow-up appointment as needed with Dr Aundra Dubin. If your palpitations do not continue to improve  call and Dr Aundra Dubin will be glad to see you again.

## 2014-01-22 DIAGNOSIS — I493 Ventricular premature depolarization: Secondary | ICD-10-CM | POA: Insufficient documentation

## 2014-01-22 NOTE — Progress Notes (Signed)
Patient ID: Howard Hays, male   DOB: 1976/03/18, 38 y.o.   MRN: [id number] PCP: Roxy Cedar  38 yo with history of OSA and a prior holter in 9/14 showing PACs and PVCs presents for evaluation of palpitations.  Patient has a history of palpitations.  He was having them last fall when he had the holter showing relatively rare PACs and PVCs.  The palpitations died down but then began again about 2 wks ago.  He will have multiple events a day where he will feel hard beats and skipped beats.  No long runs of tachycardia noted.  No exertional dyspnea or chest pain.  No lightheadedness or syncope.  He is under no particular stress.  No medications that would trigger palpitations.  He has OSA but uses his CPAP.  He does not get much exercise.  ECG from PCP's office showed a PAC.  Patient was started on Toprol XL 25 mg daily by his PCP and feels much better.  The palpitations have pretty much stopped since starting the medication.   ECG (today): NSR, normal.   Labs (7/15): K 4.2, creatinine 0.95  PMH: 1. OSA: Uses CPAP 2. Hyperlipidemia 3. Type II diabetes: Diet-controlled 4. H/o vertigo 5. Anxiety 6. PVCs/PACs: Holter (9/14) with rare PVCs and PACs.   SH: Works for [employer], lives in [place], prior smoker.  FH: HTN  ROS: All systems reviewed and negative except as per HPI.   Current Outpatient Prescriptions  Medication Sig Dispense Refill  . ammonium lactate (AMLACTIN) 12 % lotion Apply topically as needed for dry skin.  400 g  0  . aspirin 81 MG tablet Take 81 mg by mouth daily.        Marland Kitchen atorvastatin (LIPITOR) 40 MG tablet Take 1 tablet (40 mg total) by mouth daily.  90 tablet  3  . escitalopram (LEXAPRO) 10 MG tablet TAKE 1 TABLET DAILY  90 tablet  0  . metoprolol succinate (TOPROL-XL) 25 MG 24 hr tablet Take 1 tablet (25 mg total) by mouth daily.  30 tablet  3   No current facility-administered medications for this visit.    BP 138/76  Pulse 70  Ht 6\' 1"  (1.854 m)  Wt 294  lb 6.4 oz (133.539 kg)  BMI 38.85 kg/m2 General: NAD, obese.  Neck: No JVD, no thyromegaly or thyroid nodule.  Lungs: Clear to auscultation bilaterally with normal respiratory effort. CV: Nondisplaced PMI.  Heart regular S1/S2, no S3/S4, no murmur.  No peripheral edema.  No carotid bruit.  Normal pedal pulses.  Abdomen: Soft, nontender, no hepatosplenomegaly, no distention.  Skin: Intact without lesions or rashes.  Neurologic: Alert and oriented x 3.  Psych: Normal affect. Extremities: No clubbing or cyanosis.  HEENT: Normal.   Assessment/Plan: Patient presents with palpitations.  Prior holter in 9/14 showed PACs and PVCs.  ECG from PCP's office showed PAC.  I suspect that his symptoms were due to premature beats. They have subsided almost completely with Toprol XL.  - Continue Toprol XL.  He can try to gradually titrate down on Toprol XL over time.  - Echocardiogram to make sure that heart is structurally normal. - Check TSH - Continue to use CPAP nightly.  - I also encouraged a daily exercise regimen.   Loralie Champagne 01/22/2014

## 2014-01-26 ENCOUNTER — Ambulatory Visit (HOSPITAL_COMMUNITY): Payer: BC Managed Care – PPO | Attending: Cardiology | Admitting: Radiology

## 2014-01-26 DIAGNOSIS — R002 Palpitations: Secondary | ICD-10-CM

## 2014-01-26 NOTE — Progress Notes (Signed)
Echocardiogram performed.  

## 2014-02-03 ENCOUNTER — Other Ambulatory Visit: Payer: Self-pay

## 2014-02-21 ENCOUNTER — Other Ambulatory Visit: Payer: Self-pay

## 2014-02-21 MED ORDER — METOPROLOL SUCCINATE ER 25 MG PO TB24: 25.0000 mg | ORAL_TABLET | Freq: Every day | ORAL | Status: DC

## 2014-03-27 ENCOUNTER — Ambulatory Visit (INDEPENDENT_AMBULATORY_CARE_PROVIDER_SITE_OTHER): Payer: BC Managed Care – PPO | Admitting: Family

## 2014-03-27 ENCOUNTER — Encounter: Payer: Self-pay | Admitting: Family

## 2014-03-27 VITALS — BP 118/64 | HR 88 | Ht 73.0 in | Wt 284.0 lb

## 2014-03-27 DIAGNOSIS — Z0000 Encounter for general adult medical examination without abnormal findings: Secondary | ICD-10-CM

## 2014-03-27 DIAGNOSIS — I10 Essential (primary) hypertension: Secondary | ICD-10-CM

## 2014-03-27 DIAGNOSIS — Z23 Encounter for immunization: Secondary | ICD-10-CM

## 2014-03-27 DIAGNOSIS — E7800 Pure hypercholesterolemia, unspecified: Secondary | ICD-10-CM

## 2014-03-27 DIAGNOSIS — R739 Hyperglycemia, unspecified: Secondary | ICD-10-CM

## 2014-03-27 DIAGNOSIS — K219 Gastro-esophageal reflux disease without esophagitis: Secondary | ICD-10-CM

## 2014-03-27 LAB — COMPREHENSIVE METABOLIC PANEL
ALT: 23 U/L (ref 0–53)
AST: 21 U/L (ref 0–37)
Albumin: 4.2 g/dL (ref 3.5–5.2)
Alkaline Phosphatase: 63 U/L (ref 39–117)
BUN: 19 mg/dL (ref 6–23)
CO2: 21 mEq/L (ref 19–32)
Calcium: 8.9 mg/dL (ref 8.4–10.5)
Chloride: 105 mEq/L (ref 96–112)
Creatinine, Ser: 1.2 mg/dL (ref 0.4–1.5)
GFR: 86.7 mL/min (ref >60.00)
Glucose, Bld: 96 mg/dL (ref 70–99)
Potassium: 4.1 mEq/L (ref 3.5–5.1)
Sodium: 136 mEq/L (ref 135–145)
Total Bilirubin: 0.5 mg/dL (ref 0.2–1.2)
Total Protein: 8 g/dL (ref 6.0–8.3)

## 2014-03-27 LAB — LIPID PANEL
Cholesterol: 189 mg/dL (ref 0–200)
HDL: 32.5 mg/dL — ABNORMAL LOW (ref >39.00)
LDL Cholesterol: 129 mg/dL — ABNORMAL HIGH (ref 0–99)
NonHDL: 156.5
Total CHOL/HDL Ratio: 6
Triglycerides: 136 mg/dL (ref 0.0–149.0)
VLDL: 27.2 mg/dL (ref 0.0–40.0)

## 2014-03-27 LAB — CBC WITH DIFFERENTIAL/PLATELET
Basophils Absolute: 0 10*3/uL (ref 0.0–0.1)
Basophils Relative: 0.4 % (ref 0.0–3.0)
Eosinophils Absolute: 0.1 10*3/uL (ref 0.0–0.7)
Eosinophils Relative: 1.5 % (ref 0.0–5.0)
HCT: 42.9 % (ref 39.0–52.0)
Hemoglobin: 13.9 g/dL (ref 13.0–17.0)
Lymphocytes Relative: 20.6 % (ref 12.0–46.0)
Lymphs Abs: 1.1 10*3/uL (ref 0.7–4.0)
MCHC: 32.5 g/dL (ref 30.0–36.0)
MCV: 87.4 fl (ref 78.0–100.0)
Monocytes Absolute: 0.4 10*3/uL (ref 0.1–1.0)
Monocytes Relative: 8 % (ref 3.0–12.0)
Neutro Abs: 3.9 10*3/uL (ref 1.4–7.7)
Neutrophils Relative %: 69.5 % (ref 43.0–77.0)
Platelets: 279 10*3/uL (ref 150.0–400.0)
RBC: 4.91 Mil/uL (ref 4.22–5.81)
RDW: 14.3 % (ref 11.5–15.5)
WBC: 5.6 10*3/uL (ref 4.0–10.5)

## 2014-03-27 LAB — TSH: TSH: 1.07 u[IU]/mL (ref 0.35–4.50)

## 2014-03-27 LAB — POCT URINALYSIS DIPSTICK
Bilirubin, UA: NEGATIVE
Glucose, UA: NEGATIVE
Ketones, UA: NEGATIVE
Leukocytes, UA: NEGATIVE
Nitrite, UA: NEGATIVE
Protein, UA: NEGATIVE
Spec Grav, UA: 1.025
Urobilinogen, UA: 0.2
pH, UA: 5

## 2014-03-27 LAB — HEMOGLOBIN A1C: Hgb A1c MFr Bld: 6.1 % (ref 4.6–6.5)

## 2014-03-27 NOTE — Patient Instructions (Signed)
Exercise to Stay Healthy Exercise helps you become and stay healthy. EXERCISE IDEAS AND TIPS Choose exercises that:  You enjoy.  Fit into your day. You do not need to exercise really hard to be healthy. You can do exercises at a slow or medium level and stay healthy. You can:  Stretch before and after working out.  Try yoga, Pilates, or tai chi.  Lift weights.  Walk fast, swim, jog, run, climb stairs, bicycle, dance, or rollerskate.  Take aerobic classes. Exercises that burn about 150 calories:  Running 1  miles in 15 minutes.  Playing volleyball for 45 to 60 minutes.  Washing and waxing a car for 45 to 60 minutes.  Playing touch football for 45 minutes.  Walking 1  miles in 35 minutes.  Pushing a stroller 1  miles in 30 minutes.  Playing basketball for 30 minutes.  Raking leaves for 30 minutes.  Bicycling 5 miles in 30 minutes.  Walking 2 miles in 30 minutes.  Dancing for 30 minutes.  Shoveling snow for 15 minutes.  Swimming laps for 20 minutes.  Walking up stairs for 15 minutes.  Bicycling 4 miles in 15 minutes.  Gardening for 30 to 45 minutes.  Jumping rope for 15 minutes.  Washing windows or floors for 45 to 60 minutes. Document Released: 07/12/2010 Document Revised: 09/01/2011 Document Reviewed: 07/12/2010 [employer] Patient Information 2015 [employer]. This information is not intended to replace advice given to you by your health care provider. Make sure you discuss any questions you have with your health care provider.  

## 2014-03-27 NOTE — Progress Notes (Signed)
Pre visit review using our clinic review tool, if applicable. No additional management support is needed unless otherwise documented below in the visit note. 

## 2014-03-27 NOTE — Progress Notes (Signed)
Subjective:    Patient ID: Howard Hays, male    DOB: 1976-01-11, 38 y.o.   MRN: [id number]  HPI  38 year old african [place] male, nonsmoker, is in today for a CPX. He has a history of hypertension, hypercholesterolemia, GERD, anxiety and is currently stable on medications. His trying to follow a healthy diet but is not exercising.   Review of Systems  Constitutional: Negative.   HENT: Negative.   Eyes: Negative.   Respiratory: Negative.   Cardiovascular: Negative.   Gastrointestinal: Negative.   Endocrine: Negative.   Genitourinary: Negative.   Musculoskeletal: Negative.   Skin: Negative.   Allergic/Immunologic: Negative.   Neurological: Negative.   Hematological: Negative.   Psychiatric/Behavioral: Negative.    Past Medical History  Diagnosis Date  . Hyperlipidemia   . GERD (gastroesophageal reflux disease)   . OSA (obstructive sleep apnea)     apnealink 06/26/10 AHI 42  . Diabetes mellitus without complication     History   Social History  . Marital Status: Married    Spouse Name: N/A    Number of Children: N/A  . Years of Education: N/A   Occupational History  . 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 Smoking status: Former Smoker -- 0.50 packs/day for 10 years    Types: Cigarettes    Quit date: 01/12/2006  . Smokeless tobacco: Not on file  . Alcohol Use: No  . Drug Use: No  . Sexual Activity: Yes    Birth Control/ Protection: Condom   Other Topics Concern  . Not on file   Social History Narrative   Regular exercise - yes    No past surgical history on file.  Family History  Problem Relation Age of Onset  . Hyperlipidemia Other   . Stroke Paternal Grandmother   . Heart attack Paternal Grandfather   . Heart attack Maternal Grandfather   . Heart disease Paternal Uncle   . Heart disease Maternal Uncle   . Cancer Neg Hx   . Diabetes Neg Hx   . Kidney disease Neg Hx     Allergies  Allergen Reactions  . Meclizine       Current Outpatient Prescriptions on File Prior to Visit  Medication Sig Dispense Refill  . ammonium lactate (AMLACTIN) 12 % lotion Apply topically as needed for dry skin.  400 g  0  . aspirin 81 MG tablet Take 81 mg by mouth daily.        Marland Kitchen atorvastatin (LIPITOR) 40 MG tablet Take 1 tablet (40 mg total) by mouth daily.  90 tablet  3  . escitalopram (LEXAPRO) 10 MG tablet TAKE 1 TABLET DAILY  90 tablet  0  . metoprolol succinate (TOPROL-XL) 25 MG 24 hr tablet Take 1 tablet (25 mg total) by mouth daily.  90 tablet  1   No current facility-administered medications on file prior to visit.    BP 118/64  Pulse 88  Ht 6\' 1"  (1.854 m)  Wt 284 lb (128.822 kg)  BMI 37.48 kg/m2chart    Objective:   Physical Exam  Constitutional: He is oriented to person, place, and time. He appears well-developed and well-nourished.  HENT:  Head: Normocephalic.  Right Ear: External ear normal.  Left Ear: External ear normal.  Nose: Nose normal.  Mouth/Throat: Oropharynx is clear and moist.  Eyes: Conjunctivae and EOM are normal. Pupils are equal, round, and reactive to light.  Neck: Normal range of motion. Neck supple. No  thyromegaly present.  Cardiovascular: Normal rate, regular rhythm and normal heart sounds.   Pulmonary/Chest: Effort normal and breath sounds normal.  Abdominal: Soft. Bowel sounds are normal.  Musculoskeletal: Normal range of motion.  Neurological: He is alert and oriented to person, place, and time. He has normal reflexes. No cranial nerve deficit. Coordination normal.  Skin: Skin is warm and dry.  Psychiatric: He has a normal mood and affect.          Assessment & Plan:  Aasim was seen today for annual exam.  Diagnoses and associated orders for this visit:  Preventative health care - CMP - Lipid Panel - CBC with Differential - POC Urinalysis Dipstick - TSH  Essential hypertension, benign - CMP - CBC with Differential - TSH  Gastroesophageal reflux disease  without esophagitis - CBC with Differential - TSH  Pure hypercholesterolemia - CMP - Lipid Panel  Hyperglycemia - Hemoglobin A1c   Call the office with any questions or concerns. Recheck as scheduled and as needed.

## 2014-03-28 ENCOUNTER — Telehealth: Payer: Self-pay | Admitting: Family

## 2014-03-28 NOTE — Telephone Encounter (Signed)
emmi emailed °

## 2014-04-02 ENCOUNTER — Encounter: Payer: Self-pay | Admitting: Family

## 2014-04-03 ENCOUNTER — Encounter: Payer: Self-pay | Admitting: Family

## 2014-04-04 ENCOUNTER — Telehealth: Payer: Self-pay | Admitting: Family

## 2014-04-04 NOTE — Telephone Encounter (Signed)
Good morning,  Can we bring Howard Hays in to see any provider today?

## 2014-04-04 NOTE — Telephone Encounter (Signed)
Per NP its ok for pt to see PA in cardiologist. Pt was transferred to Cardiologist dept

## 2014-04-06 ENCOUNTER — Encounter: Payer: Self-pay | Admitting: Physician Assistant

## 2014-04-06 ENCOUNTER — Ambulatory Visit (INDEPENDENT_AMBULATORY_CARE_PROVIDER_SITE_OTHER): Payer: BC Managed Care – PPO | Admitting: Physician Assistant

## 2014-04-06 VITALS — BP 128/80 | HR 78 | Ht 73.0 in | Wt 283.0 lb

## 2014-04-06 DIAGNOSIS — R002 Palpitations: Secondary | ICD-10-CM

## 2014-04-06 MED ORDER — METOPROLOL SUCCINATE ER 25 MG PO TB24: 25.0000 mg | ORAL_TABLET | Freq: Two times a day (BID) | ORAL | Status: DC

## 2014-04-06 NOTE — Patient Instructions (Signed)
Your physician has recommended you make the following change in your medication:  1. INCREASE METOPROLOL TO 25 MG TWICE DAILY  Your physician has requested that you have an exercise tolerance test. For further information please visit [email or web address]. Please also follow instruction sheet, as given.  Your physician recommends that you schedule a follow-up appointment in: [place], [place] DR. Aundra Dubin

## 2014-04-06 NOTE — Progress Notes (Signed)
Cardiology Office Note   Date:  04/06/2014   ID:  Carla Whilden, DOB [birth year], MRN [id number]  PCP:  Donia Ast, FNP  Cardiologist:  Dr. Loralie Champagne     History of Present Illness: Howard Hays is a 38 y.o. male with a hx of diabetes, HL, OSA. He was evaluated by Dr. Aundra Dubin in 01/2014 with palpitations. Prior Holter demonstrated PACs and PVCs. Symptoms improved with beta blocker therapy. Echocardiogram was obtained that demonstrated normal LV function. TSH was normal. When necessary follow up was recommended  He returns for evaluation of palpitations. He continues to experience a skipping sensation like he has with PAC/PVCs in the past.  They are uncomfortable.  He denies chest pain. He denies dyspnea.  Denies syncope.  He denies orthopnea, PND, edema.  He does not drink a lot of caffeine or take any stimulants.  He does not believe he is under a lot of stress.    Studies:  - Echo (8/15):  EF 55-60%, normal wall motion, normal diastolic function, mild RVE  - Holter (9/14):  NSR, sinus tach, one 3 beat run WCT (NSVT vs SVT with aberrancy)   Recent Labs/Images:  Recent Labs  03/27/14 0853  NA 136  K 4.1  BUN 19  CREATININE 1.2  ALT 23  HGB 13.9  TSH 1.07  LDLCALC 129*  HDL 32.50*       Wt Readings from Last 3 Encounters:  03/27/14 284 lb (128.822 kg)  01/20/14 294 lb 6.4 oz (133.539 kg)  01/19/14 294 lb (133.358 kg)     Past Medical History  Diagnosis Date  . Hyperlipidemia   . GERD (gastroesophageal reflux disease)   . OSA (obstructive sleep apnea)     apnealink 06/26/10 AHI 43  . Diabetes mellitus without complication     Current Outpatient Prescriptions  Medication Sig Dispense Refill  . aspirin 81 MG tablet Take 81 mg by mouth daily.        Marland Kitchen atorvastatin (LIPITOR) 40 MG tablet Take 1 tablet (40 mg total) by mouth daily.  90 tablet  3  . escitalopram (LEXAPRO) 10 MG tablet TAKE 1 TABLET DAILY  90 tablet  0  . metoprolol succinate  (TOPROL-XL) 25 MG 24 hr tablet Take 1 tablet (25 mg total) by mouth daily.  90 tablet  1   No current facility-administered medications for this visit.     Allergies:   Meclizine   Social History:  The patient  reports that he quit smoking about 8 years ago. His smoking use included Cigarettes. He has a 5 pack-year smoking history. He does not have any smokeless tobacco history on file. He reports that he does not drink alcohol or use illicit drugs.   Family History:  The patient's family history includes Heart attack in his maternal grandfather and paternal grandfather; Heart disease in his maternal uncle and paternal uncle; Hyperlipidemia in his other; Stroke in his paternal grandmother. There is no history of Cancer, Diabetes, or Kidney disease.   ROS:  Please see the history of present illness.   He has been dieting but has only changed caloric intake (no fasting or liquid diets).   All other systems reviewed and negative.    PHYSICAL EXAM: VS:  BP 128/80  Pulse 78  Ht 6\' 1"  (1.854 m)  Wt 283 lb (128.368 kg)  BMI 37.35 kg/m2 Well nourished, well developed, in no acute distress HEENT: normal Neck: no JVD Cardiac:  normal S1, S2; RRR; no murmur  Lungs:  clear to auscultation bilaterally, no wheezing, rhonchi or rales Abd: soft, nontender, no hepatomegaly Ext: no edema Skin: warm and dry Neuro:  CNs 2-12 intact, no focal abnormalities noted  EKG:  NSR, Rightward axis, HR 78, nonspecific ST-T wave changes      ASSESSMENT AND PLAN:  1. Heart palpitations:   Symptoms seem to continue to be consistent with PACs versus PVCs. He does not believe that he is drinking too much caffeine. He is also not convinced that he is under a great deal of stress. Recent TSH and potassium are normal. I considered having him wear another Holter monitor. However, I will hold off on that for now. I will increase his Toprol-XL to 25 mg twice a day. If he gets symptomatic relief with this, he can certainly  decrease this back to 25 mg once a day at some point in the future. He does have some risk factors for coronary disease. I will arrange an exercise treadmill test to rule out exercise-induced arrhythmias and to exclude ischemia.  Disposition:   FU with Dr. Loralie Champagne or me in 6-8 weeks.    Signed, Versie Starks, MHS 04/06/2014 9:50 AM    [facility] [place], [place], [place]  [address] Phone: [phone]; Fax: [phone]

## 2014-04-26 ENCOUNTER — Encounter: Payer: BC Managed Care – PPO | Admitting: Cardiology

## 2014-04-26 ENCOUNTER — Ambulatory Visit: Payer: BC Managed Care – PPO | Admitting: Cardiology

## 2014-04-26 ENCOUNTER — Ambulatory Visit (INDEPENDENT_AMBULATORY_CARE_PROVIDER_SITE_OTHER): Payer: BC Managed Care – PPO | Admitting: Cardiology

## 2014-04-26 DIAGNOSIS — R002 Palpitations: Secondary | ICD-10-CM

## 2014-04-26 NOTE — Progress Notes (Signed)
Exercise Treadmill Test  Pre-Exercise Testing Evaluation Rhythm: normal sinus  Rate: 69 bpm     Test  Exercise Tolerance Test Ordering MD: Loralie Champagne, MD  Interpreting MD: Fransico Him, MD  Unique Test No: 1  Treadmill:  1  Indication for ETT: Palps  Contraindication to ETT: No   Stress Modality: exercise - treadmill  Cardiac Imaging Performed: non   Protocol: standard Bruce - maximal  Max BP:  229/63  Max MPHR (bpm):  181 85% MPR (bpm):  154  MPHR obtained (bpm):  160 % MPHR obtained:  88  Reached 85% MPHR (min:sec):  7:37 Total Exercise Time (min-sec):  9:00  Workload in METS:  10.1 Borg Scale: 13  Reason ETT Terminated:  Target heart rate achieved    ST Segment Analysis At Rest: normal ST segments - no evidence of significant ST depression With Exercise: no evidence of significant ST depression  Other Information Arrhythmia:  No Angina during ETT:  absent (0) Quality of ETT:  diagnostic  ETT Interpretation:  normal - no evidence of ischemia by ST analysis.   Comments: Normal ETT with no ischemia Hypertensive BP response to exericse Good exercise tolerance No chest pain   Recommendations: Further workup per Dr. Aundra Dubin

## 2014-04-27 NOTE — Progress Notes (Deleted)
Exercise Treadmill Test  Pre-Exercise Testing Evaluation Rhythm: normal sinus  Rate: 69 bpm     Test  Exercise Tolerance Test Ordering MD: Loralie Champagne, MD  Interpreting MD: Fransico Him, MD  Unique Test No: 1  Treadmill:  1  Indication for ETT: Palps  Contraindication to ETT: No   Stress Modality: exercise - treadmill  Cardiac Imaging Performed: non   Protocol: standard Bruce - maximal  Max BP:  229/63  Max MPHR (bpm):  181 85% MPR (bpm):  154  MPHR obtained (bpm):  160 % MPHR obtained:  88%  Reached 85% MPHR (min:sec):  7:37 Total Exercise Time (min-sec):  9:00  Workload in METS:  10.1 Borg Scale: 13  Reason ETT Terminated:  {CHL REASON TERMINATED FOR ETT:21021064}    ST Segment Analysis At Rest: {CHL ST SEGMENT AT REST FOR PVG:68159470} With Exercise: {CHL ST SEGMENT WITH EXERCISE FOR RAJ:51834373}  Other Information Arrhythmia:  {CHL ARRHYTHMIA FOR HDI:97847841} Angina during ETT:  {CHL ANGINA DURING QKS:08138871} Quality of ETT:  {CHL QUALITY OF LLV:74718550}  ETT Interpretation:  {CHL INTERPRETATION FOR ZTA:68257493}  Comments: ***  Recommendations: ***

## 2014-05-08 ENCOUNTER — Encounter: Payer: Self-pay | Admitting: Physician Assistant

## 2014-05-08 ENCOUNTER — Encounter: Payer: Self-pay | Admitting: Cardiology

## 2014-05-11 ENCOUNTER — Encounter: Payer: Self-pay | Admitting: Physician Assistant

## 2014-05-24 ENCOUNTER — Ambulatory Visit: Payer: BC Managed Care – PPO | Admitting: Physician Assistant

## 2014-06-05 ENCOUNTER — Ambulatory Visit (INDEPENDENT_AMBULATORY_CARE_PROVIDER_SITE_OTHER): Payer: BC Managed Care – PPO | Admitting: Physician Assistant

## 2014-06-05 ENCOUNTER — Encounter: Payer: Self-pay | Admitting: Physician Assistant

## 2014-06-05 VITALS — BP 130/80 | HR 77 | Ht 73.0 in | Wt 284.0 lb

## 2014-06-05 DIAGNOSIS — R002 Palpitations: Secondary | ICD-10-CM

## 2014-06-05 MED ORDER — METOPROLOL SUCCINATE ER 25 MG PO TB24: 50.0000 mg | ORAL_TABLET | ORAL | Status: DC

## 2014-06-05 NOTE — Patient Instructions (Signed)
TRY TO ELIMINATE ALL CAFFEINE FOR AT LEAST A COUPLE OF WEEKS; CALL IF THE PALPITATIONS CONTINUE  A REFILL WAS SENT IN TODAY FOR THE TOPROL XL 50 MG IN THE MORNING AND 25 MG IN THE PM   Your physician wants you to follow-up in: [place]. Aundra Dubin. You will receive a reminder letter in the mail two months in advance. If you don't receive a letter, please call our office to schedule the follow-up appointment.

## 2014-06-05 NOTE — Progress Notes (Signed)
Cardiology Office Note   Date:  06/05/2014   ID:  Howard Hays, DOB 07-Mar-1976, MRN [id number]  PCP:  Donia Ast, FNP  Cardiologist:  Dr. Loralie Champagne      History of Present Illness: Howard Hays is a 38 y.o. male with a hx of diabetes, HL, OSA. He was evaluated by Dr. Aundra Dubin in 01/2014 with palpitations. Prior Holter demonstrated PACs and PVCs. Symptoms improved with beta blocker therapy. Echocardiogram was obtained that demonstrated normal LV function. TSH was normal. When necessary follow up was recommended  I saw him in 03/2014 for evaluation of palpitations. Beta blocker dose was adjusted (Toprol-XL increased to 25 mg bid).  ETT was arranged and this demonstrated no ischemic ST changs and no arrhythmias.  He returns for FU.  He did E-mail Dr. Loralie Champagne with continued palpitations and his Toprol-XL was increased to 50 mg in AM and 25 mg in PM.  He did stop his Lexapro.  He felt like the palpitations increased after starting this.  He feels like his palpitations are improved.  He denies chest pain, dyspnea, syncope.  He is compliant with CPAP.     Studies: - Echo (8/15): EF 55-60%, normal wall motion, normal diastolic function, mild RVE - Holter (9/14): NSR, sinus tach, one 3 beat run WCT (NSVT vs SVT with aberrancy)  - ETT (11/15):  normal - no evidence of ischemia by ST analysis  Recent Labs: 03/27/2014: ALT 23; BUN 19; Creatinine 1.2; Hemoglobin 13.9; LDL (calc) 129*; Potassium 4.1; Sodium 136; TSH 1.07    Recent Radiology: No results found.    Wt Readings from Last 3 Encounters:  04/06/14 283 lb (128.368 kg)  03/27/14 284 lb (128.822 kg)  01/20/14 294 lb 6.4 oz (133.539 kg)     Past Medical History  Diagnosis Date  . Hyperlipidemia   . GERD (gastroesophageal reflux disease)   . OSA (obstructive sleep apnea)     apnealink 06/26/10 AHI 43  . Diabetes mellitus without complication     Current Outpatient Prescriptions  Medication Sig Dispense  Refill  . aspirin 81 MG tablet Take 81 mg by mouth daily.      Marland Kitchen atorvastatin (LIPITOR) 40 MG tablet Take 1 tablet (40 mg total) by mouth daily. 90 tablet 3  . escitalopram (LEXAPRO) 10 MG tablet TAKE 1 TABLET DAILY 90 tablet 0  . metoprolol succinate (TOPROL-XL) 25 MG 24 hr tablet Take 1 tablet (25 mg total) by mouth 2 (two) times daily. 180 tablet 3   No current facility-administered medications for this visit.     Allergies:   Meclizine   Social History:  The patient  reports that he quit smoking about 8 years ago. His smoking use included Cigarettes. He has a 5 pack-year smoking history. He does not have any smokeless tobacco history on file. He reports that he does not drink alcohol or use illicit drugs.   Family History:  The patient's family history includes Heart attack in his maternal grandfather and paternal grandfather; Heart disease in his maternal uncle and paternal uncle; Hyperlipidemia in his father, mother, and other; Hypertension in his mother; Stroke in his mother and paternal grandmother. There is no history of Cancer, Diabetes, or Kidney disease.    ROS:  Please see the history of present illness.       All other systems reviewed and negative.    PHYSICAL EXAM: VS:  BP 130/80 mmHg  Pulse 77  Ht 6\' 1"  (1.854 m)  Wt 284  lb (128.822 kg)  BMI 37.48 kg/m2  SpO2 96% Well nourished, well developed, in no acute distress HEENT: normal Neck: no JVD Cardiac:  normal S1, S2;  RRR; no murmur   Lungs:   clear to auscultation bilaterally, no wheezing, rhonchi or rales Abd: soft, nontender, no hepatomegaly Ext: no edema Skin: warm and dry Neuro:  CNs 2-12 intact, no focal abnormalities noted   ASSESSMENT AND PLAN:   1.  Palpitations:  These are improved.  He had a normal echo earlier this year and a recent ETT that was negative for ischemia.  His symptoms seemed to increase with the initiation of Lexapro.  He is improved off of this.  He has a soft drink a day.  I have  asked him to eliminate caffeine as much as possible.  If symptoms continue, he will call. We can DC Toprol at that point and start Cardizem.    Disposition:   FU with me or Dr. Loralie Champagne in 6 mos.    Signed, Versie Starks, MHS 06/05/2014 8:57 AM    [facility] [place], [place], [place]  [address] Phone: [phone]; Fax: [phone]

## 2014-07-15 ENCOUNTER — Other Ambulatory Visit: Payer: Self-pay | Admitting: Family

## 2014-11-16 ENCOUNTER — Encounter: Payer: Self-pay | Admitting: Physician Assistant

## 2014-11-22 ENCOUNTER — Encounter: Payer: Self-pay | Admitting: *Deleted

## 2014-12-13 ENCOUNTER — Encounter: Payer: Self-pay | Admitting: Family Medicine

## 2014-12-13 ENCOUNTER — Ambulatory Visit (INDEPENDENT_AMBULATORY_CARE_PROVIDER_SITE_OTHER): Payer: BLUE CROSS/BLUE SHIELD | Admitting: Family Medicine

## 2014-12-13 VITALS — BP 134/86 | HR 78 | Temp 98.6°F | Resp 16 | Ht 73.0 in | Wt 276.0 lb

## 2014-12-13 DIAGNOSIS — E669 Obesity, unspecified: Secondary | ICD-10-CM

## 2014-12-13 DIAGNOSIS — R002 Palpitations: Secondary | ICD-10-CM

## 2014-12-13 DIAGNOSIS — R7309 Other abnormal glucose: Secondary | ICD-10-CM | POA: Diagnosis not present

## 2014-12-13 DIAGNOSIS — F411 Generalized anxiety disorder: Secondary | ICD-10-CM | POA: Diagnosis not present

## 2014-12-13 DIAGNOSIS — E785 Hyperlipidemia, unspecified: Secondary | ICD-10-CM | POA: Diagnosis not present

## 2014-12-13 DIAGNOSIS — G4733 Obstructive sleep apnea (adult) (pediatric): Secondary | ICD-10-CM | POA: Diagnosis not present

## 2014-12-13 DIAGNOSIS — R7303 Prediabetes: Secondary | ICD-10-CM

## 2014-12-13 DIAGNOSIS — R42 Dizziness and giddiness: Secondary | ICD-10-CM | POA: Diagnosis not present

## 2014-12-13 LAB — CBC WITH DIFFERENTIAL/PLATELET
Basophils Absolute: 0 10*3/uL (ref 0.0–0.1)
Basophils Relative: 0 % (ref 0–1)
Eosinophils Absolute: 0.1 10*3/uL (ref 0.0–0.7)
Eosinophils Relative: 1 % (ref 0–5)
HCT: 40.2 % (ref 39.0–52.0)
Hemoglobin: 13.5 g/dL (ref 13.0–17.0)
Lymphocytes Relative: 24 % (ref 12–46)
Lymphs Abs: 1.5 10*3/uL (ref 0.7–4.0)
MCH: 28 pg (ref 26.0–34.0)
MCHC: 33.6 g/dL (ref 30.0–36.0)
MCV: 83.2 fL (ref 78.0–100.0)
MPV: 9.5 fL (ref 8.6–12.4)
Monocytes Absolute: 0.3 10*3/uL (ref 0.1–1.0)
Monocytes Relative: 5 % (ref 3–12)
Neutro Abs: 4.3 10*3/uL (ref 1.7–7.7)
Neutrophils Relative %: 70 % (ref 43–77)
Platelets: 262 10*3/uL (ref 150–400)
RBC: 4.83 MIL/uL (ref 4.22–5.81)
RDW: 14.9 % (ref 11.5–15.5)
WBC: 6.1 10*3/uL (ref 4.0–10.5)

## 2014-12-13 LAB — LIPID PANEL
Cholesterol: 167 mg/dL (ref 0–200)
HDL: 38 mg/dL — ABNORMAL LOW (ref >=40)
LDL Cholesterol: 101 mg/dL — ABNORMAL HIGH (ref 0–99)
Total CHOL/HDL Ratio: 4.4 Ratio
Triglycerides: 138 mg/dL (ref <150)
VLDL: 28 mg/dL (ref 0–40)

## 2014-12-13 LAB — COMPREHENSIVE METABOLIC PANEL
ALT: 22 U/L (ref 0–53)
AST: 20 U/L (ref 0–37)
Albumin: 4.1 g/dL (ref 3.5–5.2)
Alkaline Phosphatase: 79 U/L (ref 39–117)
BUN: 13 mg/dL (ref 6–23)
CO2: 24 mEq/L (ref 19–32)
Calcium: 9.1 mg/dL (ref 8.4–10.5)
Chloride: 104 mEq/L (ref 96–112)
Creat: 0.95 mg/dL (ref 0.50–1.35)
Glucose, Bld: 92 mg/dL (ref 70–99)
Potassium: 4.1 mEq/L (ref 3.5–5.3)
Sodium: 139 mEq/L (ref 135–145)
Total Bilirubin: 0.6 mg/dL (ref 0.2–1.2)
Total Protein: 6.9 g/dL (ref 6.0–8.3)

## 2014-12-13 NOTE — Patient Instructions (Addendum)
Referral to ENT for the vertigo We will call with lab results  F/U 6 months

## 2014-12-14 ENCOUNTER — Encounter: Payer: Self-pay | Admitting: Family Medicine

## 2014-12-14 LAB — HEMOGLOBIN A1C
Hgb A1c MFr Bld: 6 % — ABNORMAL HIGH (ref <5.7)
Mean Plasma Glucose: 126 mg/dL — ABNORMAL HIGH (ref <117)

## 2014-12-14 NOTE — Assessment & Plan Note (Signed)
Very rare occurrence, off BB, declines restarting f/u cardiology, fasting labs today

## 2014-12-14 NOTE — Assessment & Plan Note (Signed)
Recurrent mild episdoes, refer to ENT for evaluation, had reaction to meclizine, considered valium but due to SE hold off

## 2014-12-14 NOTE — Assessment & Plan Note (Signed)
Recheck A1C continue on weight loss and healthy eating

## 2014-12-14 NOTE — Progress Notes (Signed)
Patient ID: Howard Hays, male   DOB: 25-Mar-1976, 39 y.o.   MRN: [id number]   Subjective:    Patient ID: Howard Hays, male    DOB: 10/30/1975, 39 y.o.   MRN: [id number]  Patient presents for New Patient- Establish Care  Pt here to establish care, previous PCP Labuer, also followed by cardiology due to recurrent palpitations thought to be PAC. Was on metoprolol but he took himself off the medication, he also changed his diet. History of OSA as well, uses CPAP every night- seen by Dr. Halford Chessman in the past. History of glucose intolerance heaviest weight was 304lbs has been dieting but not exercising, never on meds for his elevated blood sugars, due for repeat A1C  Recurrent episodes of vertigo, treated with meclizine but had some hypersensitivity to medication, he did exercises at home using YouTube to help when spells hit him, has not seen ENT but would like to pursue further treatment   History of GAD was on lexapro but he thought this was contributing to his palpiations so he came off the med, now uses deep breathing techniques Immunizations UTD  History reviewed    Review Of Systems:  GEN- denies fatigue, fever, weight loss,weakness, recent illness HEENT- denies eye drainage, change in vision, nasal discharge, CVS- denies chest pain, palpitations RESP- denies SOB, cough, wheeze ABD- denies N/V, change in stools, abd pain GU- denies dysuria, hematuria, dribbling, incontinence MSK- denies joint pain, muscle aches, injury Neuro- denies headache, +dizziness, syncope, seizure activity       Objective:    BP 134/86 mmHg  Pulse 78  Temp(Src) 98.6 F (37 C) (Oral)  Resp 16  Ht 6\' 1"  (1.854 m)  Wt 276 lb (125.193 kg)  BMI 36.42 kg/m2 GEN- NAD, alert and oriented x3 HEENT- PERRL, EOMI, non injected sclera, pink conjunctiva, MMM, oropharynx clear, TM clear bila, canal clear Neck- Supple, no thyromegaly CVS- RRR, no murmur RESP-CTAB ABD-NABS,soft,NT,ND EXT- No edema Psych- normal  affect and mood Neuro-CNII-XII intact, no deficits Pulses- Radial, DP- 2+        Assessment & Plan:      Problem List Items Addressed This Visit    Vertigo - Primary    Recurrent mild episdoes, refer to ENT for evaluation, had reaction to meclizine, considered valium but due to SE hold off      Obstructive sleep apnea   Obesity   Hyperlipidemia   Relevant Orders   CBC with Differential/Platelet (Completed)   Comprehensive metabolic panel (Completed)   Lipid panel (Completed)   Heart palpitations    Very rare occurrence, off BB, declines restarting f/u cardiology, fasting labs today      Borderline diabetes    Recheck A1C continue on weight loss and healthy eating       Relevant Orders   CBC with Differential/Platelet (Completed)   Hemoglobin A1c (Completed)   Anxiety state    Noted in history , doing okay off meds, uses relaxation techniques          Note: This dictation was prepared with Dragon dictation along with smaller phrase technology. Any transcriptional errors that result from this process are unintentional.

## 2014-12-14 NOTE — Assessment & Plan Note (Signed)
Noted in history , doing okay off meds, uses relaxation techniques

## 2014-12-18 ENCOUNTER — Other Ambulatory Visit: Payer: Self-pay

## 2014-12-25 NOTE — Progress Notes (Signed)
Cardiology Office Note   Date:  12/25/2014   ID:  Howard Hays, DOB 1976-05-01, MRN [id number]  PCP:  Vic Blackbird, MD  Cardiologist:  Dr. Loralie Champagne     Chief Complaint  Patient presents with  . Follow-up    Palpitations     History of Present Illness: Howard Hays is a 39 y.o. male with a hx of diabetes, HL, OSA. He was evaluated by Dr. Aundra Dubin in 01/2014 with palpitations. Prior Holter demonstrated PACs and PVCs. Symptoms improved with beta blocker therapy. Echocardiogram was obtained that demonstrated normal LV function. TSH was normal. When necessary follow up was recommended  I saw him in 03/2014 for evaluation of palpitations. Beta blocker dose was adjusted. ETT was arranged and this demonstrated no ischemic ST changs and no arrhythmias.  He did stop his Lexapro. He felt like the palpitations increased after starting this.   He returns for FU.     Studies Reviewed:  Echo (8/15):  EF 55-60%, normal wall motion, normal diastolic function, mild RVE  Holter (9/14):  NSR, sinus tach, one 3 beat run WCT (NSVT vs SVT with aberrancy)  ETT (11/15):  normal - no evidence of ischemia by ST analysis   Past Medical History  Diagnosis Date  . Hyperlipidemia   . GERD (gastroesophageal reflux disease)   . OSA (obstructive sleep apnea)     apnealink 06/26/10 AHI 43  . Anxiety   . Depression   . Vertigo   . Glucose intolerance (impaired glucose tolerance)     No past surgical history on file.   Current Outpatient Prescriptions  Medication Sig Dispense Refill  . aspirin 81 MG tablet Take 81 mg by mouth daily.      Marland Kitchen atorvastatin (LIPITOR) 40 MG tablet Take 1 tablet (40 mg total) by mouth daily. 90 tablet 3   No current facility-administered medications for this visit.    Allergies:   Meclizine    Social History:  The patient  reports that he quit smoking about 8 years ago. His smoking use included Cigarettes. He has a 5 pack-year smoking history. He has  never used smokeless tobacco. He reports that he drinks about 2.4 oz of alcohol per week. He reports that he does not use illicit drugs.   Family History:  The patient's family history includes Depression in his son; Diabetes in his maternal aunt; Heart attack in his maternal grandfather and paternal grandfather; Heart disease in his maternal grandfather, maternal uncle, paternal grandfather, and paternal uncle; Hyperlipidemia in his father, mother, and other; Hypertension in his mother; Stroke in his mother and paternal grandmother. There is no history of Cancer or Kidney disease.    ROS:   Please see the history of present illness.   Review of Systems  All other systems reviewed and are negative.     PHYSICAL EXAM: VS:  There were no vitals taken for this visit.    Wt Readings from Last 3 Encounters:  12/13/14 276 lb (125.193 kg)  06/05/14 284 lb (128.822 kg)  04/06/14 283 lb (128.368 kg)     GEN: Well nourished, well developed, in no acute distress HEENT: normal Neck: no JVD, no carotid bruits, no masses Cardiac:  Normal S1/S2, RRR; no murmur ,  no rubs or gallops, no edema  Respiratory:  clear to auscultation bilaterally, no wheezing, rhonchi or rales. GI: soft, nontender, nondistended, + BS MS: no deformity or atrophy Skin: warm and dry  Neuro:  CNs II-XII intact, Strength and sensation  are intact Psych: Normal affect   EKG:  EKG is ordered today.  It demonstrates:      Recent Labs: 03/27/2014: TSH 1.07 12/13/2014: ALT 22; BUN 13; Creat 0.95; Hemoglobin 13.5; Platelets 262; Potassium 4.1; Sodium 139    Lipid Panel    Component Value Date/Time   CHOL 167 12/13/2014 1158   TRIG 138 12/13/2014 1158   HDL 38* 12/13/2014 1158   CHOLHDL 4.4 12/13/2014 1158   VLDL 28 12/13/2014 1158   LDLCALC 101* 12/13/2014 1158   LDLDIRECT 107.9 10/20/2012 1122      ASSESSMENT AND PLAN:  Heart palpitations  Hyperlipidemia  Diabetes mellitus type 2, controlled  Obstructive  sleep apnea     Medication Changes: Current medicines are reviewed at length with the patient today.  Concerns regarding medicines are as outlined above.  The following changes have been made:   Discontinued Medications   No medications on file   Modified Medications   No medications on file   New Prescriptions   No medications on file    Labs/ tests ordered today include:   No orders of the defined types were placed in this encounter.     Disposition:   FU with    Signed, Richardson Dopp, PA-C, MHS 12/25/2014 12:11 PM    [facility] [place], [place], [place]  [address] Phone: [phone]; Fax: [phone]    This encounter was created in error - please disregard.

## 2014-12-26 ENCOUNTER — Encounter: Payer: Self-pay | Admitting: Physician Assistant

## 2015-01-28 NOTE — Progress Notes (Signed)
Cardiology Office Note   Date:  01/29/2015   ID:  Howard Hays, DOB 1975-08-21, MRN [id number]  PCP:  Vic Blackbird, MD  Cardiologist:  Dr. Loralie Champagne    Chief Complaint  Patient presents with  . Palpitations      History of Present Illness: Howard Hays is a 39 y.o. male with a hx of diabetes, HL, OSA. He was evaluated by Dr. Aundra Dubin in 01/2014 with palpitations. Prior Holter demonstrated PACs and PVCs. Symptoms improved with beta blocker therapy. Echocardiogram was obtained that demonstrated normal LV function. TSH was normal.   He was seen again in 03/2014 for evaluation of palpitations. Beta blocker dose was adjusted (Toprol-XL increased to 25 mg bid).  ETT was arranged and this demonstrated no ischemic ST changs and no arrhythmias.    He returns for FU.     Studies Reviewed: - Echo (8/15): EF 55-60%, normal wall motion, normal diastolic function, mild RVE - Holter (9/14): NSR, sinus tach, one 3 beat run WCT (NSVT vs SVT with aberrancy)  - ETT (11/15):  normal - no evidence of ischemia by ST analysis   Recent Labs: 03/27/2014: TSH 1.07 12/13/2014: ALT 22; BUN 13; Creat 0.95; Hemoglobin 13.5; LDL Cholesterol 101*; Potassium 4.1; Sodium 139    Recent Radiology: No results found.    Wt Readings from Last 3 Encounters:  12/13/14 276 lb (125.193 kg)  06/05/14 284 lb (128.822 kg)  04/06/14 283 lb (128.368 kg)     Past Medical History  Diagnosis Date  . Hyperlipidemia   . GERD (gastroesophageal reflux disease)   . OSA (obstructive sleep apnea)     apnealink 06/26/10 AHI 43  . Anxiety   . Depression   . Vertigo   . Glucose intolerance (impaired glucose tolerance)     Current Outpatient Prescriptions  Medication Sig Dispense Refill  . aspirin 81 MG tablet Take 81 mg by mouth daily.      Marland Kitchen atorvastatin (LIPITOR) 40 MG tablet Take 1 tablet (40 mg total) by mouth daily. 90 tablet 3   No current facility-administered medications for this visit.      Allergies:   Meclizine   Social History:  The patient  reports that he quit smoking about 9 years ago. His smoking use included Cigarettes. He has a 5 pack-year smoking history. He has never used smokeless tobacco. He reports that he drinks about 2.4 oz of alcohol per week. He reports that he does not use illicit drugs.   Family History:  The patient's family history includes Depression in his son; Diabetes in his maternal aunt; Heart attack in his maternal grandfather and paternal grandfather; Heart disease in his maternal grandfather, maternal uncle, paternal grandfather, and paternal uncle; Hyperlipidemia in his father, mother, and other; Hypertension in his mother; Stroke in his mother and paternal grandmother. There is no history of Cancer or Kidney disease.    ROS:  Please see the history of present illness.        ROS    PHYSICAL EXAM: VS:  There were no vitals taken for this visit.   Well nourished, well developed, in no acute distress HEENT: normal Neck: no JVD Cardiac:  normal S1, S2;  RRR; no murmur   Lungs:   clear to auscultation bilaterally, no wheezing, rhonchi or rales Abd: soft, nontender, no hepatomegaly Ext: no edema Skin: warm and dry Neuro:  CNs 2-12 intact, no focal abnormalities noted    ASSESSMENT AND PLAN:   1.  Palpitations:  Medication Adjustments: Medications were reviewed today.  Any concerns are outlined above.  The following changes were made: Discontinued Medications   No medications on file   Modified Medications   No medications on file   New Prescriptions   No medications on file     Labs/Tests Ordered: No orders of the defined types were placed in this encounter.     Disposition:   FU     Signed, Versie Starks, MHS 01/29/2015 11:49 AM    [facility] [place], [place],   [address] Phone: [phone]; Fax: [phone]  This encounter was created in error - please  disregard.

## 2015-01-29 ENCOUNTER — Encounter: Payer: Self-pay | Admitting: Physician Assistant

## 2015-03-01 NOTE — Progress Notes (Signed)
Cardiology Office Note   Date:  03/02/2015   ID:  Howard Hays, DOB [birth year], MRN [id number]  PCP:  Vic Blackbird, MD  Cardiologist:  Dr. Loralie Champagne   Electrophysiologist:  N/a   Chief Complaint  Patient presents with  . Palpitations     History of Present Illness: Cerrone Debold is a 39 y.o. male with a hx of diabetes, HL, OSA. He was evaluated by Dr. Aundra Dubin in 01/2014 with palpitations. Prior Holter demonstrated PACs and PVCs. Symptoms improved with beta blocker therapy. Echocardiogram was obtained that demonstrated normal LV function. TSH was normal.   He was seen again in 03/2014 for evaluation of palpitations. Beta blocker dose was adjusted (Toprol-XL increased to 25 mg bid). ETT was arranged and this demonstrated no ischemic ST changs and no arrhythmias.   He returns for FU.  Here alone.  Continues to have palpitations on a daily basis.  He stopped Toprol-XL b/c it was not helping.  He also stopped drinking all caffeine 2 weeks ago.  Denies any OTC med or stimulant use.  Does not drink much ETOH.  Uses CPAP nightly.  No chest pain, dyspnea, orthopnea, PND, edema.  No syncope.    Studies/Reports Reviewed Today:  - Echo (8/15): EF 55-60%, normal wall motion, normal diastolic function, mild RVE - Holter (9/14): NSR, sinus tach, one 3 beat run WCT (NSVT vs SVT with aberrancy) - ETT (11/15): normal - no evidence of ischemia by ST analysis   Past Medical History  Diagnosis Date  . Hyperlipidemia   . GERD (gastroesophageal reflux disease)   . OSA (obstructive sleep apnea)     apnealink 06/26/10 AHI 43  . Anxiety   . Depression   . Vertigo   . Glucose intolerance (impaired glucose tolerance)     No past surgical history on file.   Current Outpatient Prescriptions  Medication Sig Dispense Refill  . aspirin 81 MG tablet Take 81 mg by mouth daily.      Marland Kitchen atorvastatin (LIPITOR) 40 MG tablet Take 1 tablet (40 mg total) by mouth daily. 90 tablet 3   No  current facility-administered medications for this visit.    Allergies:   Meclizine    Social History:  The patient  reports that he quit smoking about 9 years ago. His smoking use included Cigarettes. He has a 5 pack-year smoking history. He has never used smokeless tobacco. He reports that he drinks about 2.4 oz of alcohol per week. He reports that he does not use illicit drugs.   Family History:  The patient's family history includes Depression in his son; Diabetes in his maternal aunt; Heart attack in his maternal grandfather and paternal grandfather; Heart disease in his maternal grandfather, maternal uncle, paternal grandfather, and paternal uncle; Hyperlipidemia in his father, mother, and other; Hypertension in his mother; Stroke in his mother and paternal grandmother. There is no history of Cancer or Kidney disease.    ROS:   Please see the history of present illness.   Review of Systems  HENT: Positive for headaches.   Cardiovascular: Positive for irregular heartbeat.  Respiratory: Positive for cough.   Musculoskeletal: Positive for myalgias.  All other systems reviewed and are negative.     PHYSICAL EXAM: VS:  BP 118/80 mmHg  Pulse 84  Ht 6\' 1"  (1.854 m)  Wt 288 lb 1.9 oz (130.69 kg)  BMI 38.02 kg/m2    Wt Readings from Last 3 Encounters:  03/02/15 288 lb 1.9 oz (130.69 kg)  12/13/14 276 lb (125.193 kg)  06/05/14 284 lb (128.822 kg)     GEN: Well nourished, well developed, in no acute distress HEENT: normal Neck: no JVD,  no masses Cardiac:  Normal S1/S2, RRR; no murmur ,  no rubs or gallops, no edema   Respiratory:  clear to auscultation bilaterally, no wheezing, rhonchi or rales. GI: soft, nontender, nondistended, + BS MS: no deformity or atrophy Skin: warm and dry  Neuro:  CNs II-XII intact, Strength and sensation are intact Psych: Normal affect   EKG:  EKG is ordered today.  It demonstrates:   NSR, HR 84, normal axis, NSSTTW changes, QTc 425 ms, no change  from prior tracing.    Recent Labs: 03/27/2014: TSH 1.07 12/13/2014: ALT 22; BUN 13; Creat 0.95; Hemoglobin 13.5; Platelets 262; Potassium 4.1; Sodium 139    Lipid Panel    Component Value Date/Time   CHOL 167 12/13/2014 1158   TRIG 138 12/13/2014 1158   HDL 38* 12/13/2014 1158   CHOLHDL 4.4 12/13/2014 1158   VLDL 28 12/13/2014 1158   LDLCALC 101* 12/13/2014 1158   LDLDIRECT 107.9 10/20/2012 1122      ASSESSMENT AND PLAN:  Palpitations:  He continues to have symptomatic PVCs. These have been documented on Holter monitor in the past. He has not had significant relief with beta blocker therapy (Toprol-XL). He denies significant stimulant use (caffeine, decongestants, etc.).  Echo in 8/15 was normal and ETT in 11/15 was neg for ischemia.   -  Will give trial of 3 beta-blockers to try (Nadolol 20 QD, Atenolol 25 QD, Bisoprolol 2.5 QD)  -  If these do not help, consider calcium channel blocker vs referral to EP  -  Obtain 48 hour Holter to assess PVC burden  -  Labs:  BMET, Mg2+, TSH      Medication Changes: Current medicines are reviewed at length with the patient today.  Concerns regarding medicines are as outlined above.  The following changes have been made:   Discontinued Medications   No medications on file   Modified Medications   No medications on file   New Prescriptions   No medications on file    Labs/ tests ordered today include:   Orders Placed This Encounter  Procedures  . Basic metabolic panel  . Magnesium  . TSH  . Holter monitor - 48 hour  . EKG 12-Lead      Disposition:    FU with Dr. Loralie Champagne or me 3 mos.    Signed, Versie Starks, MHS 03/02/2015 12:42 PM    [facility] [address], [place], [place]  [address] Phone: [phone]; Fax: [phone]

## 2015-03-02 ENCOUNTER — Ambulatory Visit (INDEPENDENT_AMBULATORY_CARE_PROVIDER_SITE_OTHER): Payer: BLUE CROSS/BLUE SHIELD

## 2015-03-02 ENCOUNTER — Encounter: Payer: Self-pay | Admitting: Physician Assistant

## 2015-03-02 ENCOUNTER — Ambulatory Visit (INDEPENDENT_AMBULATORY_CARE_PROVIDER_SITE_OTHER): Payer: BLUE CROSS/BLUE SHIELD | Admitting: Physician Assistant

## 2015-03-02 VITALS — BP 118/80 | HR 84 | Ht 73.0 in | Wt 288.1 lb

## 2015-03-02 DIAGNOSIS — R002 Palpitations: Secondary | ICD-10-CM

## 2015-03-02 LAB — BASIC METABOLIC PANEL
BUN: 14 mg/dL (ref 6–23)
CO2: 27 mEq/L (ref 19–32)
Calcium: 9.4 mg/dL (ref 8.4–10.5)
Chloride: 104 mEq/L (ref 96–112)
Creatinine, Ser: 1.21 mg/dL (ref 0.40–1.50)
GFR: 85.46 mL/min (ref >60.00)
Glucose, Bld: 87 mg/dL (ref 70–99)
Potassium: 4.1 mEq/L (ref 3.5–5.1)
Sodium: 138 mEq/L (ref 135–145)

## 2015-03-02 LAB — TSH: TSH: 1.46 u[IU]/mL (ref 0.35–4.50)

## 2015-03-02 LAB — MAGNESIUM: Magnesium: 2.4 mg/dL (ref 1.5–2.5)

## 2015-03-02 NOTE — Patient Instructions (Signed)
Medication Instructions:  Your physician has recommended you make the following change in your medication:  1) STOP Toprol XL  Start the following medications as instructed: You have been given a written Rx for the following 3 medications: 1) START Atenolol 25mg  daily. For 2 weeks if symptoms do not improve then STOP and START Bisoprolol 2.5mg  daily for 2 weeks if symptoms do not improve STOP and START Nadolol 20mg  daily for 2 weeks.  If one of these medications work for you call the office so we can send in an Rx refill. If all 3 medications do not work call the office to give [place] an [place]   Labwork: Bmet, Mag, Tsh today  Testing/Procedures: Your physician has recommended that you wear a holter monitor. Holter monitors are medical devices that record the heart's electrical activity. Doctors most often use these monitors to diagnose arrhythmias. Arrhythmias are problems with the speed or rhythm of the heartbeat. The monitor is a small, portable device. You can wear one while you do your normal daily activities. This is usually used to diagnose what is causing palpitations/syncope (passing out).    Follow-Up: Your physician recommends that you schedule a follow-up appointment in: 3 months with Dr.McLean/ or Margaret Pyle a day that Dr.Mclean is in the office   Any Other Special Instructions Will Be Listed Below (If Applicable).

## 2015-03-12 ENCOUNTER — Encounter: Payer: Self-pay | Admitting: Physician Assistant

## 2015-03-12 ENCOUNTER — Encounter: Payer: Self-pay | Admitting: Family Medicine

## 2015-03-13 ENCOUNTER — Telehealth: Payer: Self-pay | Admitting: Physician Assistant

## 2015-03-13 NOTE — Telephone Encounter (Signed)
Follow up      Returning Howard Hays's call.  Pt left a different number for you to call

## 2015-03-13 NOTE — Telephone Encounter (Signed)
lmptcb x 3 to go over monitor results.

## 2015-03-13 NOTE — Telephone Encounter (Signed)
Follow Up ° °Pt called for results//sr  °

## 2015-03-14 NOTE — Telephone Encounter (Signed)
Notified of response from [facility].  Tried to reassure that what he is feeling is benign. Also per Nicki Reaper suggested he try taking the Nadolol 1/2 tab BID.  He states he will get a pill cutter and try taking BID.  If he continues to feel the palpitations to try the other medications.  Will call back if has any further questions.

## 2015-03-14 NOTE — Telephone Encounter (Signed)
Calling wanting to get results of monitor.  Advised that did not show any arrhythmias but did have some PVC's. He is taking the Nadolol (started last Mon the 9th.  States that has helped some.  He takes it at night but by the afternoon 2-3 o'clock seems to lose the effectiveness and has more palpitations.  This is the first medicine he has tried.  He wanted to know exactly how many PVC's he was having.  According to the monitor report he was having 16 PVC's in a 48 hr period.  He states he is having a lot more than that especially this past weekend.  He thinks he needs to wear another monitor because he does not feel like they all are being recorded.  Advised to continue to take the Nadolol but would forward to [place] to see if he has any other suggestions.  Not sure that insurance will pay for another monitor for PVC's.

## 2015-03-14 NOTE — Telephone Encounter (Signed)
He does not need a different monitor.  His monitor showed rare PVCs But, he has a lot of PACs. Out of a little over 250,000 total beats, there were approx 13,500 PACs for the duration of his monitor. He is feeling the PACs mainly.  This would be consistent with how much he is feeling.  Tell him they are essentially the same - they are premature beats.  It is just that the PACs come from the upper chambers and the PVCs are from the lower chambers. They are benign. Since the Nadolol is helping some, have him try taking 1/2 tab Twice daily to see if this helps. If it does not, try the other beta-blockers as we planned at his last office visit. Richardson Dopp, PA-C   03/14/2015 2:02 PM

## 2015-03-14 NOTE — Telephone Encounter (Signed)
Lm to call back

## 2015-03-14 NOTE — Telephone Encounter (Signed)
New message    Pt wants his test results for monitor  Please give him a call   [phone] call this number ONLY

## 2015-03-23 MED ORDER — SIMVASTATIN 80 MG PO TABS: 80.0000 mg | ORAL_TABLET | Freq: Every day | ORAL | Status: DC

## 2015-04-11 ENCOUNTER — Encounter: Payer: Self-pay | Admitting: Family Medicine

## 2015-04-18 ENCOUNTER — Other Ambulatory Visit: Payer: Self-pay | Admitting: Physician Assistant

## 2015-04-18 ENCOUNTER — Encounter: Payer: Self-pay | Admitting: Physician Assistant

## 2015-04-18 DIAGNOSIS — R002 Palpitations: Secondary | ICD-10-CM

## 2015-04-18 MED ORDER — BISOPROLOL FUMARATE 5 MG PO TABS: 5.0000 mg | ORAL_TABLET | Freq: Every day | ORAL | Status: DC

## 2015-04-20 ENCOUNTER — Encounter: Payer: Self-pay | Admitting: Family Medicine

## 2015-05-25 ENCOUNTER — Ambulatory Visit (INDEPENDENT_AMBULATORY_CARE_PROVIDER_SITE_OTHER): Payer: BLUE CROSS/BLUE SHIELD | Admitting: Family Medicine

## 2015-05-25 ENCOUNTER — Emergency Department (HOSPITAL_COMMUNITY): Payer: BLUE CROSS/BLUE SHIELD

## 2015-05-25 ENCOUNTER — Encounter: Payer: Self-pay | Admitting: Family Medicine

## 2015-05-25 ENCOUNTER — Encounter (HOSPITAL_COMMUNITY): Payer: Self-pay

## 2015-05-25 ENCOUNTER — Emergency Department (HOSPITAL_COMMUNITY)
Admission: EM | Admit: 2015-05-25 | Discharge: 2015-05-25 | Disposition: A | Discharge status: Home / Self Care | Payer: BLUE CROSS/BLUE SHIELD | Attending: Emergency Medicine | Admitting: Emergency Medicine

## 2015-05-25 VITALS — BP 130/74 | HR 88 | Temp 98.5°F | Resp 16 | Ht 73.0 in | Wt 298.0 lb

## 2015-05-25 DIAGNOSIS — Z8679 Personal history of other diseases of the circulatory system: Secondary | ICD-10-CM | POA: Insufficient documentation

## 2015-05-25 DIAGNOSIS — E785 Hyperlipidemia, unspecified: Secondary | ICD-10-CM | POA: Diagnosis not present

## 2015-05-25 DIAGNOSIS — R079 Chest pain, unspecified: Secondary | ICD-10-CM | POA: Diagnosis not present

## 2015-05-25 DIAGNOSIS — Z8659 Personal history of other mental and behavioral disorders: Secondary | ICD-10-CM | POA: Insufficient documentation

## 2015-05-25 DIAGNOSIS — Z8719 Personal history of other diseases of the digestive system: Secondary | ICD-10-CM | POA: Diagnosis not present

## 2015-05-25 DIAGNOSIS — Z87891 Personal history of nicotine dependence: Secondary | ICD-10-CM | POA: Diagnosis not present

## 2015-05-25 DIAGNOSIS — Z79899 Other long term (current) drug therapy: Secondary | ICD-10-CM | POA: Insufficient documentation

## 2015-05-25 DIAGNOSIS — R12 Heartburn: Secondary | ICD-10-CM | POA: Insufficient documentation

## 2015-05-25 DIAGNOSIS — Z7982 Long term (current) use of aspirin: Secondary | ICD-10-CM | POA: Diagnosis not present

## 2015-05-25 LAB — CBC
HCT: 42.4 % (ref 39.0–52.0)
Hemoglobin: 14.3 g/dL (ref 13.0–17.0)
MCH: 29.2 pg (ref 26.0–34.0)
MCHC: 33.7 g/dL (ref 30.0–36.0)
MCV: 86.7 fL (ref 78.0–100.0)
Platelets: 246 10*3/uL (ref 150–400)
RBC: 4.89 MIL/uL (ref 4.22–5.81)
RDW: 13.7 % (ref 11.5–15.5)
WBC: 6.2 10*3/uL (ref 4.0–10.5)

## 2015-05-25 LAB — BASIC METABOLIC PANEL
Anion gap: 7 (ref 5–15)
BUN: 15 mg/dL (ref 6–20)
CO2: 24 mmol/L (ref 22–32)
Calcium: 9.1 mg/dL (ref 8.9–10.3)
Chloride: 108 mmol/L (ref 101–111)
Creatinine, Ser: 1.3 mg/dL — ABNORMAL HIGH (ref 0.61–1.24)
GFR calc Af Amer: >60 mL/min (ref >60)
GFR calc non Af Amer: >60 mL/min (ref >60)
Glucose, Bld: 110 mg/dL — ABNORMAL HIGH (ref 65–99)
Potassium: 4.1 mmol/L (ref 3.5–5.1)
Sodium: 139 mmol/L (ref 135–145)

## 2015-05-25 LAB — I-STAT TROPONIN, ED: Troponin i, poc: 0 ng/mL (ref 0.00–0.08)

## 2015-05-25 NOTE — Progress Notes (Signed)
Patient ID: Howard Hays, male   DOB: [birth year], 39 y.o.   MRN: [id number]   Subjective:    Patient ID: Howard Hays, male    DOB: 1976/06/12, 39 y.o.   MRN: [id number]  Patient presents for Intermittent Chest Pain   Pt here with chest pain for the past 30 hours. Start yesterday AM, he would get a heavy pressure on left side of chest that would last for about and hour then dissipate. No radiating symptoms, no N/V, diaphoresis, SOB associated. It would then resolve for 1-2 hours and return. He took 4 ASA late last night when pain worsened. He states she just doesn't feel good but no recent illness. No change in meds. He had 2 more episodes this AM so came to clinic as walk in.  He is being treated for frequency PVC/PAC with bisoprolol- followed by Dr. Aundra Dubin.  Denies any acid reflux, denies increased anxiety or stress    Review Of Systems:  GEN- denies fatigue, fever, weight loss,weakness, recent illness HEENT- denies eye drainage, change in vision, nasal discharge, CVS- + chest pain, palpitations RESP- denies SOB, cough, wheeze ABD- denies N/V, change in stools, abd pain GU- denies dysuria, hematuria, dribbling, incontinence MSK- denies joint pain, muscle aches, injury Neuro- denies headache, dizziness, syncope, seizure activity       Objective:    BP 130/74 mmHg  Pulse 88  Temp(Src) 98.5 F (36.9 C) (Oral)  Resp 16  Ht 6\' 1"  (1.854 m)  Wt 298 lb (135.172 kg)  BMI 39.32 kg/m2 GEN- NAD, alert and oriented x3 HEENT- PERRL, EOMI, non injected sclera, pink conjunctiva, MMM, oropharynx clear Neck- Supple, no JVD CVS- RRR, no murmur RESP-CTAB ABD-NABS,soft,NT,ND EXT- No edema Pulses- Radial,  2+  EKG- NSR, poor r wave progressopm       Assessment & Plan:      Problem List Items Addressed This Visit    None    Visit Diagnoses    Chest pain, unspecified    -  Primary    Recurrent CP, has PAC and PVC at baseline, risk factors are boderline DM, hyperlipidemia and  Obesity. COncern for persistant pain, send to [facility] for cardiac rule out. He is going to drive to [place] now, no current CP, vitals stable. We have called ahead to hospital.    Relevant Orders    EKG 12-Lead (Completed)       Note: This dictation was prepared with Dragon dictation along with smaller phrase technology. Any transcriptional errors that result from this process are unintentional.

## 2015-05-25 NOTE — ED Provider Notes (Signed)
CSN: [id number]     Arrival date & time 05/25/15  [id number] History   First MD Initiated Contact with Patient 05/25/15 1012     Chief Complaint  Patient presents with  . Chest Pain     (Consider location/radiation/quality/duration/timing/severity/associated sxs/prior Treatment) Patient is a 39 y.o. male presenting with chest pain. The history is provided by the patient.  Chest Pain Pain location:  L chest Pain quality: aching and sharp   Pain radiates to:  Does not radiate Pain radiates to the back: no   Pain severity:  Mild Onset quality:  Gradual Duration:  1 day Timing:  Intermittent Progression:  Waxing and waning Chronicity:  New Context: at rest   Relieved by:  Nothing Worsened by:  Nothing tried Ineffective treatments:  None tried Associated symptoms: heartburn   Associated symptoms: no cough, no fever and no shortness of breath   Risk factors: high cholesterol   Risk factors: no diabetes mellitus, no hypertension and no smoking     Past Medical History  Diagnosis Date  . Hyperlipidemia   . GERD (gastroesophageal reflux disease)   . OSA (obstructive sleep apnea)     apnealink 06/26/10 AHI 43  . Anxiety   . Depression   . Vertigo   . Glucose intolerance (impaired glucose tolerance)    Past Surgical History  Procedure Laterality Date  . Finger surgery     Family History  Problem Relation Age of Onset  . Hyperlipidemia Other   . Stroke Paternal Grandmother   . Heart attack Paternal Grandfather   . Heart disease Paternal Grandfather   . Heart attack Maternal Grandfather   . Heart disease Maternal Grandfather   . Heart disease Paternal Uncle   . Heart disease Maternal Uncle   . Cancer Neg Hx   . Kidney disease Neg Hx   . Stroke Mother   . Hypertension Mother   . Hyperlipidemia Mother   . Hyperlipidemia Father   . Depression Son   . Diabetes Maternal Aunt    Social History  Substance Use Topics  . Smoking status: Former Smoker -- 0.50 packs/day for 10  years    Types: Cigarettes    Quit date: 01/12/2006  . Smokeless tobacco: Never Used  . Alcohol Use: 2.4 oz/week    4 Shots of liquor per week    Review of Systems  Constitutional: Negative for fever.  Respiratory: Negative for cough and shortness of breath.   Cardiovascular: Positive for chest pain.  Gastrointestinal: Positive for heartburn.  All other systems reviewed and are negative.     Allergies  Meclizine  Home Medications   Prior to Admission medications   Medication Sig Start Date End Date Taking? Authorizing Provider  aspirin 81 MG tablet Take 81 mg by mouth daily.     Yes Historical Provider, MD  bisoprolol (ZEBETA) 5 MG tablet Take 1 tablet (5 mg total) by mouth daily. 04/18/15  Yes Liliane Shi, PA-C  Multiple Vitamins-Minerals (MULTIVITAMIN WITH MINERALS) tablet Take 1 tablet by mouth daily.   Yes Historical Provider, MD  simvastatin (ZOCOR) 80 MG tablet Take 1 tablet (80 mg total) by mouth daily. 03/23/15  Yes Alycia Rossetti, MD   BP 139/72 mmHg  Pulse 69  Temp(Src) 98.1 F (36.7 C) (Oral)  Resp 17  Ht 6\' 1"  (1.854 m)  Wt 298 lb (135.172 kg)  BMI 39.32 kg/m2  SpO2 98% Physical Exam  Constitutional: He is oriented to person, place, and time. He appears well-developed  and well-nourished. No distress.  HENT:  Head: Normocephalic and atraumatic.  Eyes: Conjunctivae are normal.  Neck: Neck supple. No tracheal deviation present.  Cardiovascular: Normal rate, regular rhythm and normal heart sounds.  Exam reveals no gallop and no friction rub.   No murmur heard. Pulmonary/Chest: Effort normal and breath sounds normal. No respiratory distress.  Abdominal: Soft. He exhibits no distension. There is no tenderness. There is no rebound.  Neurological: He is alert and oriented to person, place, and time.  Skin: Skin is warm and dry.  Psychiatric: He has a normal mood and affect.    ED Course  Procedures (including critical care time) Labs Review Labs  Reviewed  BASIC METABOLIC PANEL - Abnormal; Notable for the following:    Glucose, Bld 110 (*)    Creatinine, Ser 1.30 (*)    All other components within normal limits  CBC  I-STAT TROPOININ, ED    Imaging Review Dg Chest 2 View  05/25/2015  CLINICAL DATA:  Left chest pain.  On off since yesterday. EXAM: CHEST  2 VIEW COMPARISON:  None. FINDINGS: The heart size and mediastinal contours are within normal limits. Both lungs are clear. The visualized skeletal structures are unremarkable. IMPRESSION: No active cardiopulmonary disease. Electronically Signed   By: Kathreen Devoid   On: 05/25/2015 11:33   I have personally reviewed and evaluated these images and lab results as part of my medical decision-making.   EKG Interpretation   Date/Time:  Friday May 25 2015 09:46:54 EST Ventricular Rate:  68 PR Interval:  175 QRS Duration: 91 QT Interval:  381 QTC Calculation: 405 R Axis:   67 Text Interpretation:  Sinus rhythm Baseline wander in lead(s) V1 V2 V3 No  significant change since last tracing Confirmed by Keshawn Fiorito MD, Maurisio Ruddy  [id number]) on 05/25/2015 9:50:40 AM      MDM   Final diagnoses:  Chest pain, unspecified chest pain type    39 y.o. male presents with left-sided chest pain since last night, described as sharp and burning. He had some tingling in his left hand but no radiation of the pain. No other typical symptoms of ACS, EKG is nonischemic, heart score is 1, troponin is negative despite ongoing pain for greater than 6 hours, atypical for PE and PERC negative. Plan to follow up with PCP as needed and return precautions discussed for worsening or new concerning symptoms.     Leo Grosser, MD 05/25/15 [phone]

## 2015-05-25 NOTE — ED Notes (Signed)
Pt. Coming from dr. Gabriel Carina  with c/o chest pain starting yesterday morning. Per pt. Dr. Shann Medal normal EKG. Episodes come and go and pain is in left-side of chest. Pt reports tingling radiating into left hand. Pt. Describes pain as aching. Pt. Denies n/v, lightheadness, or dizziness. Pt. sts nothing has helped ease the pain.

## 2015-05-25 NOTE — Discharge Instructions (Signed)
Nonspecific Chest Pain  °Chest pain can be caused by many different conditions. There is always a chance that your pain could be related to something serious, such as a heart attack or a blood clot in your lungs. Chest pain can also be caused by conditions that are not life-threatening. If you have chest pain, it is very important to follow up with your health care provider. °CAUSES  °Chest pain can be caused by: °· Heartburn. °· Pneumonia or bronchitis. °· Anxiety or stress. °· Inflammation around your heart (pericarditis) or lung (pleuritis or pleurisy). °· A blood clot in your lung. °· A collapsed lung (pneumothorax). It can develop suddenly on its own (spontaneous pneumothorax) or from trauma to the chest. °· Shingles infection (varicella-zoster virus). °· Heart attack. °· Damage to the bones, muscles, and cartilage that make up your chest wall. This can include: °¨ Bruised bones due to injury. °¨ Strained muscles or cartilage due to frequent or repeated coughing or overwork. °¨ Fracture to one or more ribs. °¨ Sore cartilage due to inflammation (costochondritis). °RISK FACTORS  °Risk factors for chest pain may include: °· Activities that increase your risk for trauma or injury to your chest. °· Respiratory infections or conditions that cause frequent coughing. °· Medical conditions or overeating that can cause heartburn. °· Heart disease or family history of heart disease. °· Conditions or health behaviors that increase your risk of developing a blood clot. °· Having had chicken pox (varicella zoster). °SIGNS AND SYMPTOMS °Chest pain can feel like: °· Burning or tingling on the surface of your chest or deep in your chest. °· Crushing, pressure, aching, or squeezing pain. °· Dull or sharp pain that is worse when you move, cough, or take a deep breath. °· Pain that is also felt in your back, neck, shoulder, or arm, or pain that spreads to any of these areas. °Your chest pain may come and go, or it may stay  constant. °DIAGNOSIS °Lab tests or other studies may be needed to find the cause of your pain. Your health care provider may have you take a test called an ambulatory ECG (electrocardiogram). An ECG records your heartbeat patterns at the time the test is performed. You may also have other tests, such as: °· Transthoracic echocardiogram (TTE). During echocardiography, sound waves are used to create a picture of all of the heart structures and to look at how blood flows through your heart. °· Transesophageal echocardiogram (TEE). This is a more advanced imaging test that obtains images from inside your body. It allows your health care provider to see your heart in finer detail. °· Cardiac monitoring. This allows your health care provider to monitor your heart rate and rhythm in real time. °· Holter monitor. This is a portable device that records your heartbeat and can help to diagnose abnormal heartbeats. It allows your health care provider to track your heart activity for several days, if needed. °· Stress tests. These can be done through exercise or by taking medicine that makes your heart beat more quickly. °· Blood tests. °· Imaging tests. °TREATMENT  °Your treatment depends on what is causing your chest pain. Treatment may include: °· Medicines. These may include: °¨ Acid blockers for heartburn. °¨ Anti-inflammatory medicine. °¨ Pain medicine for inflammatory conditions. °¨ Antibiotic medicine, if an infection is present. °¨ Medicines to dissolve blood clots. °¨ Medicines to treat coronary artery disease. °· Supportive care for conditions that do not require medicines. This may include: °¨ Resting. °¨ Applying heat   or cold packs to injured areas. °¨ Limiting activities until pain decreases. °HOME CARE INSTRUCTIONS °· If you were prescribed an antibiotic medicine, finish it all even if you start to feel better. °· Avoid any activities that bring on chest pain. °· Do not use any tobacco products, including  cigarettes, chewing tobacco, or electronic cigarettes. If you need help quitting, ask your health care provider. °· Do not drink alcohol. °· Take medicines only as directed by your health care provider. °· Keep all follow-up visits as directed by your health care provider. This is important. This includes any further testing if your chest pain does not go away. °· If heartburn is the cause for your chest pain, you may be told to keep your head raised (elevated) while sleeping. This reduces the chance that acid will go from your stomach into your esophagus. °· Make lifestyle changes as directed by your health care provider. These may include: °¨ Getting regular exercise. Ask your health care provider to suggest some activities that are safe for you. °¨ Eating a heart-healthy diet. A registered dietitian can help you to learn healthy eating options. °¨ Maintaining a healthy weight. °¨ Managing diabetes, if necessary. °¨ Reducing stress. °SEEK MEDICAL CARE IF: °· Your chest pain does not go away after treatment. °· You have a rash with blisters on your chest. °· You have a fever. °SEEK IMMEDIATE MEDICAL CARE IF:  °· Your chest pain is worse. °· You have an increasing cough, or you cough up blood. °· You have severe abdominal pain. °· You have severe weakness. °· You faint. °· You have chills. °· You have sudden, unexplained chest discomfort. °· You have sudden, unexplained discomfort in your arms, back, neck, or jaw. °· You have shortness of breath at any time. °· You suddenly start to sweat, or your skin gets clammy. °· You feel nauseous or you vomit. °· You suddenly feel light-headed or dizzy. °· Your heart begins to beat quickly, or it feels like it is skipping beats. °These symptoms may represent a serious problem that is an emergency. Do not wait to see if the symptoms will go away. Get medical help right away. Call your local emergency services (911 in the [place] Do not drive yourself to the hospital. °  °This  information is not intended to replace advice given to you by your health care provider. Make sure you discuss any questions you have with your health care provider. °  °Document Released: 03/19/2005 Document Revised: 06/30/2014 Document Reviewed: 01/13/2014 °Elsevier Interactive Patient Education ©2016 [employer] ° °

## 2015-05-25 NOTE — Patient Instructions (Signed)
Go directly to ER for chest pain rule out

## 2015-06-07 ENCOUNTER — Ambulatory Visit: Payer: BLUE CROSS/BLUE SHIELD | Admitting: Physician Assistant

## 2015-06-07 ENCOUNTER — Ambulatory Visit: Payer: BLUE CROSS/BLUE SHIELD | Admitting: Cardiology

## 2015-06-13 ENCOUNTER — Encounter: Payer: Self-pay | Admitting: Family Medicine

## 2015-06-13 ENCOUNTER — Ambulatory Visit (INDEPENDENT_AMBULATORY_CARE_PROVIDER_SITE_OTHER): Payer: BLUE CROSS/BLUE SHIELD | Admitting: Family Medicine

## 2015-06-13 VITALS — BP 128/70 | HR 72 | Temp 97.7°F | Resp 14 | Ht 73.0 in | Wt 296.0 lb

## 2015-06-13 DIAGNOSIS — Z23 Encounter for immunization: Secondary | ICD-10-CM

## 2015-06-13 DIAGNOSIS — R7303 Prediabetes: Secondary | ICD-10-CM | POA: Diagnosis not present

## 2015-06-13 DIAGNOSIS — E669 Obesity, unspecified: Secondary | ICD-10-CM | POA: Diagnosis not present

## 2015-06-13 DIAGNOSIS — R002 Palpitations: Secondary | ICD-10-CM | POA: Diagnosis not present

## 2015-06-13 DIAGNOSIS — E785 Hyperlipidemia, unspecified: Secondary | ICD-10-CM

## 2015-06-13 LAB — COMPREHENSIVE METABOLIC PANEL
ALT: 75 U/L — ABNORMAL HIGH (ref 9–46)
AST: 51 U/L — ABNORMAL HIGH (ref 10–40)
Albumin: 3.9 g/dL (ref 3.6–5.1)
Alkaline Phosphatase: 83 U/L (ref 40–115)
BUN: 11 mg/dL (ref 7–25)
CO2: 25 mmol/L (ref 20–31)
Calcium: 8.9 mg/dL (ref 8.6–10.3)
Chloride: 104 mmol/L (ref 98–110)
Creat: 0.98 mg/dL (ref 0.60–1.35)
Glucose, Bld: 109 mg/dL — ABNORMAL HIGH (ref 70–99)
Potassium: 3.9 mmol/L (ref 3.5–5.3)
Sodium: 136 mmol/L (ref 135–146)
Total Bilirubin: 0.3 mg/dL (ref 0.2–1.2)
Total Protein: 7.3 g/dL (ref 6.1–8.1)

## 2015-06-13 LAB — CBC WITH DIFFERENTIAL/PLATELET
Basophils Absolute: 0 10*3/uL (ref 0.0–0.1)
Basophils Relative: 0 % (ref 0–1)
Eosinophils Absolute: 0.1 10*3/uL (ref 0.0–0.7)
Eosinophils Relative: 1 % (ref 0–5)
HCT: 41.8 % (ref 39.0–52.0)
Hemoglobin: 14.2 g/dL (ref 13.0–17.0)
Lymphocytes Relative: 24 % (ref 12–46)
Lymphs Abs: 1.5 10*3/uL (ref 0.7–4.0)
MCH: 28.7 pg (ref 26.0–34.0)
MCHC: 34 g/dL (ref 30.0–36.0)
MCV: 84.6 fL (ref 78.0–100.0)
MPV: 9.8 fL (ref 8.6–12.4)
Monocytes Absolute: 0.5 10*3/uL (ref 0.1–1.0)
Monocytes Relative: 8 % (ref 3–12)
Neutro Abs: 4.2 10*3/uL (ref 1.7–7.7)
Neutrophils Relative %: 67 % (ref 43–77)
Platelets: 272 10*3/uL (ref 150–400)
RBC: 4.94 MIL/uL (ref 4.22–5.81)
RDW: 15.1 % (ref 11.5–15.5)
WBC: 6.3 10*3/uL (ref 4.0–10.5)

## 2015-06-13 LAB — LIPID PANEL
Cholesterol: 141 mg/dL (ref 125–200)
HDL: 28 mg/dL — ABNORMAL LOW (ref >=40)
LDL Cholesterol: 63 mg/dL (ref <130)
Total CHOL/HDL Ratio: 5 Ratio (ref <=5.0)
Triglycerides: 248 mg/dL — ABNORMAL HIGH (ref <150)
VLDL: 50 mg/dL — ABNORMAL HIGH (ref <30)

## 2015-06-13 NOTE — Progress Notes (Signed)
Patient ID: Howard Hays, male   DOB: 10/03/1975, 39 y.o.   MRN: [id number]   Subjective:    Patient ID: Howard Hays, male    DOB: 1975-12-19, 39 y.o.   MRN: [id number]  Patient presents for 6 month F/U  is here to follow-up. He was sent to the emergency room after her last visit a few weeks ago because of chest pain. His cardiac rule out was negative. He has a follow-up with the cardiologist however he would like to see the actual physician as he has many questions about why he still has to PVCs the palpitations and his workup thus far. It appears like he's had negative stress testing and negative echocardiogram. He states that he is not feel the PACs very much even though we do pick them up on exam. It is still unclear the cause of his previous chest pain though it is now resolved.  He is borderline diabetic there is been no change in his weight. He is seen for repeat fasting labs today.  He was treated at your to care for influenza last week he did complete a course of Tamiflu.    Review Of Systems:  GEN- denies fatigue, fever, weight loss,weakness, recent illness HEENT- denies eye drainage, change in vision, nasal discharge, CVS- denies chest pain, palpitations RESP- denies SOB, cough, wheeze ABD- denies N/V, change in stools, abd pain GU- denies dysuria, hematuria, dribbling, incontinence MSK- denies joint pain, muscle aches, injury Neuro- denies headache, dizziness, syncope, seizure activity       Objective:    BP 128/70 mmHg  Pulse 72  Temp(Src) 97.7 F (36.5 C) (Oral)  Resp 14  Ht 6\' 1"  (1.854 m)  Wt 296 lb (134.265 kg)  BMI 39.06 kg/m2 GEN- NAD, alert and oriented x3 HEENT- PERRL, EOMI, non injected sclera, pink conjunctiva, MMM, oropharynx clear Neck- Supple, no thyromegaly CVS- RRR, no murmur RESP-CTAB EXT- No edema Pulses- Radial 2+        Assessment & Plan:      Problem List Items Addressed This Visit    Obesity   Hyperlipidemia   Relevant Orders    Lipid panel   Borderline diabetes - Primary   Relevant Orders   CBC with Differential/Platelet   Comprehensive metabolic panel   Hemoglobin A1c    Other Visit Diagnoses    Need for prophylactic vaccination and inoculation against influenza        Relevant Orders    Flu Vaccine QUAD 36+ mos PF IM (Fluarix & Fluzone Quad PF) (Completed)       Note: This dictation was prepared with Dragon dictation along with smaller phrase technology. Any transcriptional errors that result from this process are unintentional.

## 2015-06-13 NOTE — Assessment & Plan Note (Signed)
Recurrent episodes of chest pain and palpitations though unclear cause. He is being treated for sleep apnea he does not have any GERD symptoms his pressure is well-controlled. I'll schedule him a follow-up appointment with Dr. Radford Pax at [facility] nature that there is nothing else that we are missing. He will continue the bisoprolol

## 2015-06-13 NOTE — Patient Instructions (Addendum)
We will schedule with cardiologist  Continue current medications Use mucinex DM  Flu shot given  Jan 11th at 9am with Dr. Radford Pax- Cardiology F/U  6 MONTHS

## 2015-06-13 NOTE — Assessment & Plan Note (Signed)
Borderline diabetes mellitus. Recheck his labs today he also has a obesity we discussed healthy eating and need for weight loss. He has family history of hyperlipidemia and diabetes.

## 2015-06-14 LAB — HEMOGLOBIN A1C
Hgb A1c MFr Bld: 6.4 % — ABNORMAL HIGH (ref <5.7)
Mean Plasma Glucose: 137 mg/dL — ABNORMAL HIGH (ref <117)

## 2015-06-19 ENCOUNTER — Encounter: Payer: Self-pay | Admitting: *Deleted

## 2015-06-22 ENCOUNTER — Ambulatory Visit: Payer: BLUE CROSS/BLUE SHIELD | Admitting: Cardiology

## 2015-06-26 ENCOUNTER — Other Ambulatory Visit: Payer: Self-pay

## 2015-06-26 ENCOUNTER — Encounter: Payer: Self-pay | Admitting: Family Medicine

## 2015-06-26 DIAGNOSIS — R002 Palpitations: Secondary | ICD-10-CM

## 2015-06-26 NOTE — Telephone Encounter (Signed)
Medication Detail      Disp Refills Start End     bisoprolol (ZEBETA) 5 MG tablet 30 tablet 11 04/18/2015     Sig - Route: Take 1 tablet (5 mg total) by mouth daily. - Oral    E-Prescribing Status: Receipt confirmed by pharmacy (04/18/2015 5:34 PM EDT)     Associated Diagnoses    Heart palpitations - Primary       Pharmacy    [id number] - [place], [place] - [address]

## 2015-06-27 MED ORDER — SIMVASTATIN 80 MG PO TABS
80.0000 mg | ORAL_TABLET | Freq: Every day | ORAL | Status: DC
Start: 2015-06-27 — End: 2015-12-24

## 2015-06-27 MED ORDER — BISOPROLOL FUMARATE 5 MG PO TABS: 5.0000 mg | ORAL_TABLET | Freq: Every day | ORAL | Status: DC

## 2015-07-04 ENCOUNTER — Ambulatory Visit: Payer: BLUE CROSS/BLUE SHIELD | Admitting: Cardiology

## 2015-08-16 ENCOUNTER — Encounter: Payer: Self-pay | Admitting: Family Medicine

## 2015-09-18 NOTE — Progress Notes (Signed)
This encounter was created in error - please disregard.

## 2015-09-19 ENCOUNTER — Encounter: Payer: Self-pay | Admitting: Cardiology

## 2015-09-20 ENCOUNTER — Encounter: Payer: Self-pay | Admitting: Cardiology

## 2015-12-21 ENCOUNTER — Ambulatory Visit (INDEPENDENT_AMBULATORY_CARE_PROVIDER_SITE_OTHER): Payer: Managed Care, Other (non HMO) | Admitting: Family Medicine

## 2015-12-21 ENCOUNTER — Encounter: Payer: Self-pay | Admitting: Family Medicine

## 2015-12-21 VITALS — BP 136/70 | HR 82 | Temp 98.5°F | Resp 14 | Ht 73.0 in | Wt 298.0 lb

## 2015-12-21 DIAGNOSIS — E785 Hyperlipidemia, unspecified: Secondary | ICD-10-CM | POA: Diagnosis not present

## 2015-12-21 DIAGNOSIS — E669 Obesity, unspecified: Secondary | ICD-10-CM | POA: Diagnosis not present

## 2015-12-21 DIAGNOSIS — R7303 Prediabetes: Secondary | ICD-10-CM | POA: Diagnosis not present

## 2015-12-21 DIAGNOSIS — B351 Tinea unguium: Secondary | ICD-10-CM | POA: Diagnosis not present

## 2015-12-21 LAB — CBC WITH DIFFERENTIAL/PLATELET
Basophils Absolute: 52 cells/uL (ref 0–200)
Basophils Relative: 1 %
Eosinophils Absolute: 104 cells/uL (ref 15–500)
Eosinophils Relative: 2 %
HCT: 41.5 % (ref 38.5–50.0)
Hemoglobin: 13.8 g/dL (ref 13.0–17.0)
Lymphocytes Relative: 30 %
Lymphs Abs: 1560 cells/uL (ref 850–3900)
MCH: 28.8 pg (ref 27.0–33.0)
MCHC: 33.3 g/dL (ref 32.0–36.0)
MCV: 86.5 fL (ref 80.0–100.0)
MPV: 10.9 fL (ref 7.5–12.5)
Monocytes Absolute: 416 cells/uL (ref 200–950)
Monocytes Relative: 8 %
Neutro Abs: 3068 cells/uL (ref 1500–7800)
Neutrophils Relative %: 59 %
Platelets: 255 10*3/uL (ref 140–400)
RBC: 4.8 MIL/uL (ref 4.20–5.80)
RDW: 14.9 % (ref 11.0–15.0)
WBC: 5.2 10*3/uL (ref 3.8–10.8)

## 2015-12-21 LAB — COMPREHENSIVE METABOLIC PANEL
ALT: 29 U/L (ref 9–46)
AST: 19 U/L (ref 10–40)
Albumin: 4.2 g/dL (ref 3.6–5.1)
Alkaline Phosphatase: 58 U/L (ref 40–115)
BUN: 15 mg/dL (ref 7–25)
CO2: 24 mmol/L (ref 20–31)
Calcium: 9 mg/dL (ref 8.6–10.3)
Chloride: 106 mmol/L (ref 98–110)
Creat: 1.1 mg/dL (ref 0.60–1.35)
Glucose, Bld: 102 mg/dL — ABNORMAL HIGH (ref 70–99)
Potassium: 4.6 mmol/L (ref 3.5–5.3)
Sodium: 144 mmol/L (ref 135–146)
Total Bilirubin: 0.4 mg/dL (ref 0.2–1.2)
Total Protein: 7.2 g/dL (ref 6.1–8.1)

## 2015-12-21 LAB — LIPID PANEL
Cholesterol: 198 mg/dL (ref 125–200)
HDL: 43 mg/dL (ref >=40)
LDL Cholesterol: 115 mg/dL (ref <130)
Total CHOL/HDL Ratio: 4.6 Ratio (ref <=5.0)
Triglycerides: 201 mg/dL — ABNORMAL HIGH (ref <150)
VLDL: 40 mg/dL — ABNORMAL HIGH (ref <30)

## 2015-12-21 LAB — HEMOGLOBIN A1C
Hgb A1c MFr Bld: 6.1 % — ABNORMAL HIGH (ref <5.7)
Mean Plasma Glucose: 128 mg/dL

## 2015-12-21 NOTE — Progress Notes (Signed)
Patient ID: Howard Hays, male   DOB: 07/16/1975, 40 y.o.   MRN: [id number]   Subjective:    Patient ID: Howard Hays, male    DOB: 08/01/1975, 40 y.o.   MRN: [id number]  Patient presents for 6 month F/U Here to follow-up chronic medical problems. He has obesity the setting of borderline diabetes and hypertension, he also has hyperlipidemia with mild elevation in liver function tests at her last check 6 months ago which is think is due to fatty liver. A1c was at 6.4, triglycerides 248 please not had any weight loss since her last visit. He did not come back in 4 weeks to have his labs rechecked in December he is due for labs today States he gained up 305lbs has been working to lose weight recently with his wife, his wife has DM as well, strong family history of DM  Also concerned about fungus of toenail for past few months  Medications reviewed   Review Of Systems:  GEN- denies fatigue, fever, weight loss,weakness, recent illness HEENT- denies eye drainage, change in vision, nasal discharge, CVS- denies chest pain, palpitations RESP- denies SOB, cough, wheeze ABD- denies N/V, change in stools, abd pain GU- denies dysuria, hematuria, dribbling, incontinence MSK- denies joint pain, muscle aches, injury Neuro- denies headache, dizziness, syncope, seizure activity       Objective:    BP 136/70 mmHg  Pulse 82  Temp(Src) 98.5 F (36.9 C) (Oral)  Resp 14  Ht 6\' 1"  (1.854 m)  Wt 298 lb (135.172 kg)  BMI 39.32 kg/m2 GEN- NAD, alert and oriented x3 HEENT- PERRL, EOMI, non injected sclera, pink conjunctiva, MMM, oropharynx clear Neck- Supple, no thyromegaly CVS- RRR, no murmur RESP-CTAB Skin- yellowing of right great toenial, brittle edges  EXT- No edema Pulses- Radial  2+        Assessment & Plan:      Problem List Items Addressed This Visit    Obesity   Hyperlipidemia - Primary    States he is willing to try lipitor again if needed       Relevant Orders   CBC with  Differential/Platelet (Completed)   Comprehensive metabolic panel (Completed)   Lipid panel (Completed)   Borderline diabetes    Discussed healthy eating, need for weight loss He states he was actually up to 305lbs in the past few weeks and has started a new diet and walking with his wife.  Recheck labs today  Treat based on results       Relevant Orders   CBC with Differential/Platelet (Completed)   Hemoglobin A1c (Completed)    Other Visit Diagnoses    Onychomycosis        Discussed with his elevated LFT and statin topical would be safer        Note: This dictation was prepared with Dragon dictation along with smaller phrase technology. Any transcriptional errors that result from this process are unintentional.

## 2015-12-21 NOTE — Patient Instructions (Signed)
Topical medication to be called in after I see labs  We will call with lab results F/U pending results

## 2015-12-22 ENCOUNTER — Other Ambulatory Visit: Payer: Self-pay | Admitting: Family Medicine

## 2015-12-23 MED ORDER — CICLOPIROX 8 % EX SOLN: Freq: Every day | CUTANEOUS | Status: DC

## 2015-12-23 NOTE — Assessment & Plan Note (Signed)
States he is willing to try lipitor again if needed

## 2015-12-23 NOTE — Assessment & Plan Note (Signed)
Discussed healthy eating, need for weight loss He states he was actually up to 305lbs in the past few weeks and has started a new diet and walking with his wife.  Recheck labs today  Treat based on results

## 2015-12-24 ENCOUNTER — Encounter: Payer: Self-pay | Admitting: Family Medicine

## 2015-12-24 MED ORDER — ATORVASTATIN CALCIUM 40 MG PO TABS: 40.0000 mg | ORAL_TABLET | Freq: Every day | ORAL | Status: DC

## 2015-12-24 NOTE — Telephone Encounter (Signed)
Refill appropriate and filled per protocol. 

## 2016-02-13 ENCOUNTER — Encounter: Payer: Self-pay | Admitting: Family Medicine

## 2016-02-14 MED ORDER — CLONAZEPAM 0.5 MG PO TABS: 0.5000 mg | ORAL_TABLET | Freq: Two times a day (BID) | ORAL | 0 refills | Status: DC | PRN

## 2016-02-14 MED ORDER — BUSPIRONE HCL 7.5 MG PO TABS: 7.5000 mg | ORAL_TABLET | Freq: Two times a day (BID) | ORAL | 0 refills | Status: DC

## 2016-02-14 NOTE — Addendum Note (Signed)
Addended by: Sheral Flow on: 02/14/2016 02:24 PM   Modules accepted: Orders

## 2016-02-28 ENCOUNTER — Encounter: Payer: Self-pay | Admitting: Physician Assistant

## 2016-02-28 ENCOUNTER — Ambulatory Visit (INDEPENDENT_AMBULATORY_CARE_PROVIDER_SITE_OTHER): Payer: Managed Care, Other (non HMO) | Admitting: Physician Assistant

## 2016-02-28 VITALS — BP 136/70 | HR 85 | Temp 98.0°F | Resp 18

## 2016-02-28 DIAGNOSIS — A084 Viral intestinal infection, unspecified: Secondary | ICD-10-CM

## 2016-02-28 NOTE — Progress Notes (Signed)
Patient ID: Howard Hays MRN: [id number], DOB: 10-12-1975, 40 y.o. Date of Encounter: 02/28/2016, 9:44 AM    Chief Complaint:  Chief Complaint  Patient presents with  . Nausea    diarrhea, cramps,fever,muscle aches, statrted Mon      HPI: 40 y.o. year old male presents with above.   Says that the main thing that he was experiencing was abdominal cramps and diarrhea. Says that this is really bad on Monday but has tapered off since then. Yesterday thought he was totally over it but then later in the afternoon had another episode of diarrhea. He should stay out of work again tomorrow to make sure that he is not spreading germs and getting people at work sick. His work schedule is usually Monday through Friday. Says that he has had no vomiting at any point during this illness. States that he has had no focal/localized abdominal pain. Says that rather it is just come in "waves" and been crampy discomfort prior to the diarrhea. Having no fevers or chills currently. Feeling better.     Home Meds:   Outpatient Medications Prior to Visit  Medication Sig Dispense Refill  . Multiple Vitamins-Minerals (MULTIVITAMIN WITH MINERALS) tablet Take 1 tablet by mouth daily.    Marland Kitchen aspirin 81 MG tablet Take 81 mg by mouth daily.      Marland Kitchen atorvastatin (LIPITOR) 40 MG tablet Take 1 tablet (40 mg total) by mouth daily. 90 tablet 3  . bisoprolol (ZEBETA) 5 MG tablet TAKE 1 TABLET DAILY 90 tablet 0  . busPIRone (BUSPAR) 7.5 MG tablet Take 1 tablet (7.5 mg total) by mouth 2 (two) times daily. (Patient not taking: Reported on 02/28/2016) 60 tablet 0  . ciclopirox (PENLAC) 8 % solution Apply topically at bedtime. Apply over nail and surrounding skin. Apply daily over previous coat. After seven (7) days, may remove with alcohol and continue cycle. (Patient not taking: Reported on 02/28/2016) 6.6 mL 0   No facility-administered medications prior to visit.     Allergies:  Allergies  Allergen Reactions  . Meclizine        Review of Systems: See HPI for pertinent ROS. All other ROS negative.    Physical Exam: Blood pressure 136/70, pulse 85, temperature 98 F (36.7 C), temperature source Oral, resp. rate 18., There is no height or weight on file to calculate BMI. General:  AAM. Appears in no acute distress. Neck: Supple. No thyromegaly. No lymphadenopathy. Lungs: Clear bilaterally to auscultation without wheezes, rales, or rhonchi. Breathing is unlabored. Heart: Regular rhythm. No murmurs, rubs, or gallops. Abdomen: Soft, non-distended with normoactive bowel sounds. No hepatomegaly. No rebound/guarding. No obvious abdominal masses. Minimal tenderness with palpation of abdomen. No area of localized/focal tenderness with palpation. Msk:  Strength and tone normal for age. Extremities/Skin: Warm and dry.  Neuro: Alert and oriented X 3. Moves all extremities spontaneously. Gait is normal. CNII-XII grossly in tact. Psych:  Responds to questions appropriately with a normal affect.     ASSESSMENT AND PLAN:  40 y.o. year old male with  1. Viral gastroenteritis Told him to make sure to drink lots of fluids to prevent dehydration. He states that he has drank tons of water. Told him that even if he feels that he is getting his appetite back that he needs to stick with clear liquids and very bland diet as the inside of his GI system is still irritated/sick. Gradually advance diet as tolerated.  Note given for out of work 02/26/16 through 02/29/16. (  He says that he missed no work Monday because this was Labor Day holiday and he was off work anyway).   Signed, [address] [place], [place], [employer] 02/28/2016 9:44 AM

## 2016-03-12 ENCOUNTER — Other Ambulatory Visit: Payer: Self-pay | Admitting: Family Medicine

## 2016-03-18 ENCOUNTER — Ambulatory Visit: Payer: Self-pay | Admitting: Family Medicine

## 2016-03-31 ENCOUNTER — Ambulatory Visit (INDEPENDENT_AMBULATORY_CARE_PROVIDER_SITE_OTHER): Payer: Managed Care, Other (non HMO) | Admitting: Family Medicine

## 2016-03-31 ENCOUNTER — Encounter: Payer: Self-pay | Admitting: Physician Assistant

## 2016-03-31 VITALS — BP 132/78 | HR 81 | Temp 98.9°F | Resp 16 | Wt 307.0 lb

## 2016-03-31 DIAGNOSIS — Z6841 Body Mass Index (BMI) 40.0 and over, adult: Secondary | ICD-10-CM

## 2016-03-31 DIAGNOSIS — IMO0001 Reserved for inherently not codable concepts without codable children: Secondary | ICD-10-CM

## 2016-03-31 DIAGNOSIS — E6609 Other obesity due to excess calories: Secondary | ICD-10-CM | POA: Diagnosis not present

## 2016-03-31 DIAGNOSIS — F411 Generalized anxiety disorder: Secondary | ICD-10-CM | POA: Diagnosis not present

## 2016-03-31 DIAGNOSIS — R7303 Prediabetes: Secondary | ICD-10-CM

## 2016-03-31 DIAGNOSIS — R002 Palpitations: Secondary | ICD-10-CM

## 2016-03-31 MED ORDER — BISOPROLOL FUMARATE 5 MG PO TABS: 5.0000 mg | ORAL_TABLET | Freq: Every day | ORAL | 2 refills | Status: DC

## 2016-03-31 MED ORDER — BUSPIRONE HCL 7.5 MG PO TABS: 7.5000 mg | ORAL_TABLET | Freq: Two times a day (BID) | ORAL | 2 refills | Status: DC

## 2016-03-31 MED ORDER — ATORVASTATIN CALCIUM 40 MG PO TABS: 40.0000 mg | ORAL_TABLET | Freq: Every day | ORAL | 3 refills | Status: DC

## 2016-03-31 NOTE — Assessment & Plan Note (Signed)
Recurrent PVC palpitations. Restart his beta blocker. We'll also continue with the BuSpar. He does get some early morning fatigue when he takes the BuSpar but that wears off very quickly. We discussed his diet and his weight as well as the cholesterol and diabetes. He would like to have some time to get his health back on track before having his labs done. We'll give him the next 3 months to work on dietary and lifestyle changes he will have fasting labs at her follow-up visit in 3 months.

## 2016-03-31 NOTE — Progress Notes (Signed)
   Subjective:    Patient ID: Howard Hays, male    DOB: 1976/02/13, 40 y.o.   MRN: [id number]  Patient presents for Follow-up Patient here for follow-up. History of PVCs but also stress and anxiety. He states that things have been stressful at work recently. He actually stopped the beta blocker when he started the BuSpar he was confused and did not know he is best to take them both. He did have increased PVCs the past week. He denies any chest pain or shortness of breath. Unfortunately he has gained 10 pounds of the past few months. He is already borderline diabetic and has hyperlipidemia. He admits that he has not been watching his diet he has not been exercising.    Review Of Systems:  GEN- denies fatigue, fever, weight loss,weakness, recent illness HEENT- denies eye drainage, change in vision, nasal discharge, CVS- denies chest pain, palpitations RESP- denies SOB, cough, wheeze ABD- denies N/V, change in stools, abd pain GU- denies dysuria, hematuria, dribbling, incontinence MSK- denies joint pain, muscle aches, injury Neuro- denies headache, dizziness, syncope, seizure activity       Objective:    BP 132/78   Pulse 81   Temp 98.9 F (37.2 C) (Oral)   Resp 16   Wt (!) 307 lb (139.3 kg)   SpO2 98%   BMI 40.50 kg/m  GEN- NAD, alert and oriented x3 HEENT- PERRL, EOMI, non injected sclera, pink conjunctiva, MMM, oropharynx clear Neck- Supple, no thyromegaly CVS- RRR, no murmur RESP-CTAB Psych normal affect and mood EXT- No edema Pulses- Radial  2+        Assessment & Plan:      Problem List Items Addressed This Visit    Obesity - Primary   Heart palpitations    Recurrent PVC palpitations. Restart his beta blocker. We'll also continue with the BuSpar. He does get some early morning fatigue when he takes the BuSpar but that wears off very quickly. We discussed his diet and his weight as well as the cholesterol and diabetes. He would like to have some time to get his  health back on track before having his labs done. We'll give him the next 3 months to work on dietary and lifestyle changes he will have fasting labs at her follow-up visit in 3 months.      Borderline diabetes   Anxiety state    Other Visit Diagnoses   None.     Note: This dictation was prepared with Dragon dictation along with smaller phrase technology. Any transcriptional errors that result from this process are unintentional.

## 2016-03-31 NOTE — Patient Instructions (Signed)
Restart beta blocker- Zebeta  Work on diet  F/U 3 months fasting

## 2016-04-10 ENCOUNTER — Ambulatory Visit: Payer: Self-pay | Admitting: Physician Assistant

## 2016-04-19 ENCOUNTER — Other Ambulatory Visit: Payer: Self-pay | Admitting: Family Medicine

## 2016-05-05 ENCOUNTER — Encounter: Payer: Self-pay | Admitting: Family Medicine

## 2016-05-07 MED ORDER — ALPRAZOLAM 0.5 MG PO TABS: 0.5000 mg | ORAL_TABLET | Freq: Two times a day (BID) | ORAL | 0 refills | Status: DC | PRN

## 2016-05-07 NOTE — Telephone Encounter (Signed)
Call placed to patient and patient made aware.   Medication called to pharmacy. 

## 2016-06-20 ENCOUNTER — Emergency Department (HOSPITAL_COMMUNITY): Payer: Managed Care, Other (non HMO)

## 2016-06-20 ENCOUNTER — Encounter (HOSPITAL_COMMUNITY): Payer: Self-pay | Admitting: *Deleted

## 2016-06-20 ENCOUNTER — Emergency Department (HOSPITAL_COMMUNITY)
Admission: EM | Admit: 2016-06-20 | Discharge: 2016-06-20 | Disposition: A | Discharge status: Home / Self Care | Payer: Managed Care, Other (non HMO) | Attending: Emergency Medicine | Admitting: Emergency Medicine

## 2016-06-20 DIAGNOSIS — Z87891 Personal history of nicotine dependence: Secondary | ICD-10-CM | POA: Insufficient documentation

## 2016-06-20 DIAGNOSIS — Y9389 Activity, other specified: Secondary | ICD-10-CM | POA: Insufficient documentation

## 2016-06-20 DIAGNOSIS — S161XXA Strain of muscle, fascia and tendon at neck level, initial encounter: Secondary | ICD-10-CM | POA: Diagnosis not present

## 2016-06-20 DIAGNOSIS — Y92410 Unspecified street and highway as the place of occurrence of the external cause: Secondary | ICD-10-CM | POA: Diagnosis not present

## 2016-06-20 DIAGNOSIS — S199XXA Unspecified injury of neck, initial encounter: Secondary | ICD-10-CM | POA: Diagnosis present

## 2016-06-20 DIAGNOSIS — Z7982 Long term (current) use of aspirin: Secondary | ICD-10-CM | POA: Diagnosis not present

## 2016-06-20 DIAGNOSIS — R109 Unspecified abdominal pain: Secondary | ICD-10-CM | POA: Insufficient documentation

## 2016-06-20 DIAGNOSIS — S301XXA Contusion of abdominal wall, initial encounter: Secondary | ICD-10-CM | POA: Diagnosis not present

## 2016-06-20 DIAGNOSIS — Y999 Unspecified external cause status: Secondary | ICD-10-CM | POA: Diagnosis not present

## 2016-06-20 HISTORY — DX: Migraine, unspecified, not intractable, without status migrainosus: G43.909

## 2016-06-20 MED ORDER — NAPROXEN 500 MG PO TABS: 500.0000 mg | ORAL_TABLET | Freq: Two times a day (BID) | ORAL | 0 refills | Status: DC

## 2016-06-20 MED ORDER — CYCLOBENZAPRINE HCL 10 MG PO TABS: 10.0000 mg | ORAL_TABLET | Freq: Two times a day (BID) | ORAL | 0 refills | Status: DC | PRN

## 2016-06-20 MED ORDER — CYCLOBENZAPRINE HCL 10 MG PO TABS
10.0000 mg | ORAL_TABLET | Freq: Once | ORAL | Status: AC
  Administered 2016-06-20: 10 mg via ORAL
  Filled 2016-06-20: qty 1

## 2016-06-20 NOTE — ED Notes (Signed)
Ortho paged for soft cervical collar

## 2016-06-20 NOTE — Progress Notes (Signed)
Orthopedic Tech Progress Note Patient Details:  Howard Hays [birth year] [id number]  Ortho Devices Type of Ortho Device: Soft collar Ortho Device/Splint Location: neck Ortho Device/Splint Interventions: Ordered, Application   [name] 06/20/2016, 8:30 PM
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
## 2016-06-20 NOTE — ED Notes (Signed)
Patient transported to CT 
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
## 2016-06-20 NOTE — ED Notes (Signed)
Pt stable, ambulatory, states understanding of discharge instructions 

## 2016-07-01 ENCOUNTER — Ambulatory Visit (INDEPENDENT_AMBULATORY_CARE_PROVIDER_SITE_OTHER): Payer: Managed Care, Other (non HMO) | Admitting: Family Medicine

## 2016-07-01 ENCOUNTER — Encounter: Payer: Self-pay | Admitting: Family Medicine

## 2016-07-01 VITALS — BP 136/80 | HR 70 | Temp 98.9°F | Resp 16 | Ht 73.0 in | Wt 299.0 lb

## 2016-07-01 DIAGNOSIS — E782 Mixed hyperlipidemia: Secondary | ICD-10-CM | POA: Diagnosis not present

## 2016-07-01 DIAGNOSIS — S161XXD Strain of muscle, fascia and tendon at neck level, subsequent encounter: Secondary | ICD-10-CM

## 2016-07-01 DIAGNOSIS — F411 Generalized anxiety disorder: Secondary | ICD-10-CM

## 2016-07-01 DIAGNOSIS — R7303 Prediabetes: Secondary | ICD-10-CM | POA: Diagnosis not present

## 2016-07-01 DIAGNOSIS — G44209 Tension-type headache, unspecified, not intractable: Secondary | ICD-10-CM

## 2016-07-01 LAB — CBC WITH DIFFERENTIAL/PLATELET
Basophils Absolute: 0 cells/uL (ref 0–200)
Basophils Relative: 0 %
Eosinophils Absolute: 78 cells/uL (ref 15–500)
Eosinophils Relative: 1 %
HCT: 42.7 % (ref 38.5–50.0)
Hemoglobin: 14.2 g/dL (ref 13.0–17.0)
Lymphocytes Relative: 24 %
Lymphs Abs: 1872 cells/uL (ref 850–3900)
MCH: 28.6 pg (ref 27.0–33.0)
MCHC: 33.3 g/dL (ref 32.0–36.0)
MCV: 85.9 fL (ref 80.0–100.0)
MPV: 10.3 fL (ref 7.5–12.5)
Monocytes Absolute: 468 cells/uL (ref 200–950)
Monocytes Relative: 6 %
Neutro Abs: 5382 cells/uL (ref 1500–7800)
Neutrophils Relative %: 69 %
Platelets: 274 10*3/uL (ref 140–400)
RBC: 4.97 MIL/uL (ref 4.20–5.80)
RDW: 15.4 % — ABNORMAL HIGH (ref 11.0–15.0)
WBC: 7.8 10*3/uL (ref 3.8–10.8)

## 2016-07-01 LAB — COMPREHENSIVE METABOLIC PANEL
ALT: 45 U/L (ref 9–46)
AST: 30 U/L (ref 10–40)
Albumin: 3.8 g/dL (ref 3.6–5.1)
Alkaline Phosphatase: 81 U/L (ref 40–115)
BUN: 15 mg/dL (ref 7–25)
CO2: 23 mmol/L (ref 20–31)
Calcium: 9 mg/dL (ref 8.6–10.3)
Chloride: 106 mmol/L (ref 98–110)
Creat: 1.16 mg/dL (ref 0.60–1.35)
Glucose, Bld: 97 mg/dL (ref 70–99)
Potassium: 4.2 mmol/L (ref 3.5–5.3)
Sodium: 140 mmol/L (ref 135–146)
Total Bilirubin: 0.5 mg/dL (ref 0.2–1.2)
Total Protein: 7.4 g/dL (ref 6.1–8.1)

## 2016-07-01 LAB — LIPID PANEL
Cholesterol: 171 mg/dL (ref <200)
HDL: 42 mg/dL (ref >40)
LDL Cholesterol: 76 mg/dL (ref <100)
Total CHOL/HDL Ratio: 4.1 Ratio (ref <5.0)
Triglycerides: 267 mg/dL — ABNORMAL HIGH (ref <150)
VLDL: 53 mg/dL — ABNORMAL HIGH (ref <30)

## 2016-07-01 LAB — HEMOGLOBIN A1C
Hgb A1c MFr Bld: 6 % — ABNORMAL HIGH (ref <5.7)
Mean Plasma Glucose: 126 mg/dL

## 2016-07-01 MED ORDER — ALPRAZOLAM 0.5 MG PO TABS: 0.5000 mg | ORAL_TABLET | Freq: Two times a day (BID) | ORAL | 0 refills | Status: DC | PRN

## 2016-07-01 MED ORDER — NAPROXEN 500 MG PO TABS: 500.0000 mg | ORAL_TABLET | Freq: Two times a day (BID) | ORAL | 0 refills | Status: DC

## 2016-07-01 MED ORDER — DIAZEPAM 5 MG PO TABS: 5.0000 mg | ORAL_TABLET | Freq: Every evening | ORAL | 0 refills | Status: DC | PRN

## 2016-07-01 MED ORDER — NAPROXEN 500 MG PO TABS: 500.0000 mg | ORAL_TABLET | Freq: Two times a day (BID) | ORAL | 1 refills | Status: DC

## 2016-07-01 NOTE — Patient Instructions (Addendum)
Take valium at bedtime Take the naprosyn twice a day for next 2 weeks F/U 2 weeks
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
## 2016-07-01 NOTE — Assessment & Plan Note (Signed)
Recheck his 123456 metabolic panel as well as lipid panel. He is currently on statin drug will adjust as needed. Continue to work on dietary changes and weight loss he is now below 300 pounds.

## 2016-07-14 ENCOUNTER — Encounter: Payer: Self-pay | Admitting: Family Medicine

## 2016-07-14 ENCOUNTER — Ambulatory Visit (INDEPENDENT_AMBULATORY_CARE_PROVIDER_SITE_OTHER): Payer: Managed Care, Other (non HMO) | Admitting: Family Medicine

## 2016-07-14 DIAGNOSIS — G44209 Tension-type headache, unspecified, not intractable: Secondary | ICD-10-CM | POA: Diagnosis not present

## 2016-07-14 DIAGNOSIS — S161XXD Strain of muscle, fascia and tendon at neck level, subsequent encounter: Secondary | ICD-10-CM

## 2016-07-14 MED ORDER — DIAZEPAM 5 MG PO TABS: 5.0000 mg | ORAL_TABLET | Freq: Every evening | ORAL | 0 refills | Status: DC | PRN

## 2016-07-14 MED ORDER — ALPRAZOLAM 0.5 MG PO TABS: 0.5000 mg | ORAL_TABLET | Freq: Two times a day (BID) | ORAL | 2 refills | Status: DC | PRN
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
## 2016-07-14 NOTE — Patient Instructions (Addendum)
F/u 4 Months

## 2016-07-15 ENCOUNTER — Ambulatory Visit: Payer: Managed Care, Other (non HMO) | Admitting: Family Medicine

## 2016-07-28 ENCOUNTER — Telehealth: Payer: Self-pay | Admitting: *Deleted
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
## 2016-08-19 ENCOUNTER — Telehealth: Payer: Self-pay | Admitting: Physician Assistant

## 2016-08-19 ENCOUNTER — Telehealth: Payer: Self-pay | Admitting: Family Medicine

## 2016-08-19 ENCOUNTER — Other Ambulatory Visit: Payer: Self-pay | Admitting: Family Medicine

## 2016-08-19 ENCOUNTER — Telehealth: Payer: Self-pay | Admitting: *Deleted

## 2016-08-19 MED ORDER — OSELTAMIVIR PHOSPHATE 75 MG PO CAPS: 75.0000 mg | ORAL_CAPSULE | Freq: Every day | ORAL | 0 refills | Status: DC

## 2016-08-19 NOTE — Telephone Encounter (Signed)
Pts son was seen on 08/18/2016 for the flu, and he wants to know if he can get tamiflu sent to pharmacy for himself.

## 2016-08-19 NOTE — Telephone Encounter (Signed)
Patient wife e-mailed office in regards to sick children in home.   MD aware and orders given for Tamiflu.  Prescription sent to pharmacy.

## 2016-08-19 NOTE — Progress Notes (Signed)
Family with flu, he has no symptoms yet Tamiflu prophylaxis

## 2016-08-20 NOTE — Telephone Encounter (Signed)
Wife Ander Purpura  said she had emailed [employer] to see if the rest of the family could get Tamiflu because they were all having flu like symptoms  when I returned a call regarding a school note.  Spoke with Jenny Reichmann and he stated he did receive the Rx

## 2016-08-25 ENCOUNTER — Ambulatory Visit (INDEPENDENT_AMBULATORY_CARE_PROVIDER_SITE_OTHER): Payer: 59 | Admitting: Physician Assistant

## 2016-08-25 ENCOUNTER — Encounter: Payer: Self-pay | Admitting: Physician Assistant

## 2016-08-25 ENCOUNTER — Encounter: Payer: Self-pay | Admitting: Family Medicine

## 2016-08-25 VITALS — BP 136/92 | HR 80 | Temp 97.9°F | Resp 16 | Wt 310.4 lb

## 2016-08-25 DIAGNOSIS — B9689 Other specified bacterial agents as the cause of diseases classified elsewhere: Secondary | ICD-10-CM

## 2016-08-25 DIAGNOSIS — J988 Other specified respiratory disorders: Secondary | ICD-10-CM

## 2016-08-25 MED ORDER — AZITHROMYCIN 250 MG PO TABS: ORAL_TABLET | ORAL | 0 refills | Status: DC

## 2016-08-25 NOTE — Progress Notes (Signed)
Patient ID: Howard Hays MRN: [id number], DOB: 05-30-1976, 41 y.o. Date of Encounter: 08/25/2016, 2:55 PM    Chief Complaint:  Chief Complaint  Patient presents with  . Cough  . Nasal Congestion  . Headache     HPI: 41 y.o. year old male presents with above.   States that he has had the symptoms for more than a week. Congestion in his head and nose for over a week. The cough just started the last couple of days. No significant amount of sore throat. No fevers or chills.     Home Meds:   Outpatient Medications Prior to Visit  Medication Sig Dispense Refill  . ALPRAZolam (XANAX) 0.5 MG tablet Take 1 tablet (0.5 mg total) by mouth 2 (two) times daily as needed for anxiety. 30 tablet 2  . aspirin 81 MG tablet Take 81 mg by mouth daily.      Marland Kitchen atorvastatin (LIPITOR) 40 MG tablet Take 1 tablet (40 mg total) by mouth daily. 90 tablet 3  . bisoprolol (ZEBETA) 5 MG tablet Take 1 tablet (5 mg total) by mouth daily. 90 tablet 2  . busPIRone (BUSPAR) 7.5 MG tablet Take 1 tablet (7.5 mg total) by mouth 2 (two) times daily. 180 tablet 2  . ciclopirox (PENLAC) 8 % solution Apply topically at bedtime. Apply over nail and surrounding skin. Apply daily over previous coat. After seven (7) days, may remove with alcohol and continue cycle. 6.6 mL 0  . diazepam (VALIUM) 5 MG tablet Take 1 tablet (5 mg total) by mouth at bedtime as needed for anxiety. For muscle spasm 45 tablet 0  . Multiple Vitamins-Minerals (MULTIVITAMIN WITH MINERALS) tablet Take 1 tablet by mouth daily.    . naproxen (NAPROSYN) 500 MG tablet Take 1 tablet (500 mg total) by mouth 2 (two) times daily. 60 tablet 1  . oseltamivir (TAMIFLU) 75 MG capsule Take 1 capsule (75 mg total) by mouth daily. 7 capsule 0   No facility-administered medications prior to visit.     Allergies:  Allergies  Allergen Reactions  . Meclizine       Review of Systems: See HPI for pertinent ROS. All other ROS negative.    Physical Exam: Blood  pressure (!) 136/92, pulse 80, temperature 97.9 F (36.6 C), temperature source Oral, resp. rate 16, weight (!) 310 lb 6.4 oz (140.8 kg), SpO2 98 %., Body mass index is 40.95 kg/m. General:  Obese Male. Appears in no acute distress. HEENT: Normocephalic, atraumatic, eyes without discharge, sclera non-icteric, nares are without discharge. Bilateral auditory canals clear, TM's are without perforation, pearly grey and translucent with reflective cone of light bilaterally. Oral cavity moist, posterior pharynx without exudate, erythema, peritonsillar abscess.  Neck: Supple. No thyromegaly. No lymphadenopathy. Lungs: Clear bilaterally to auscultation without wheezes, rales, or rhonchi. Breathing is unlabored. Heart: Regular rhythm. No murmurs, rubs, or gallops. Msk:  Strength and tone normal for age. Extremities/Skin: Warm and dry.  Neuro: Alert and oriented X 3. Moves all extremities spontaneously. Gait is normal. CNII-XII grossly in tact. Psych:  Responds to questions appropriately with a normal affect.     ASSESSMENT AND PLAN:  41 y.o. year old male with  1. Bacterial respiratory infection He is to take antibiotic as directed. Follow-up if symptoms do not resolve within 1 week after completion of antibiotic. - azithromycin (ZITHROMAX) 250 MG tablet; Day 1: Take 2 daily. Days 2 -5: Take 1 daily.  Dispense: 6 tablet; Refill: 0   Signed, Karis Juba,  PA, BSFM 08/25/2016 2:55 PM

## 2016-08-27 NOTE — Telephone Encounter (Signed)
Clearing off encounter   °

## 2016-09-29 ENCOUNTER — Encounter: Payer: Self-pay | Admitting: Family Medicine

## 2016-09-29 ENCOUNTER — Other Ambulatory Visit: Payer: Self-pay | Admitting: Family Medicine

## 2016-09-29 DIAGNOSIS — G4733 Obstructive sleep apnea (adult) (pediatric): Secondary | ICD-10-CM

## 2016-09-29 MED ORDER — BUSPIRONE HCL 7.5 MG PO TABS: 7.5000 mg | ORAL_TABLET | Freq: Two times a day (BID) | ORAL | 2 refills | Status: DC

## 2016-09-29 MED ORDER — ATORVASTATIN CALCIUM 40 MG PO TABS: 40.0000 mg | ORAL_TABLET | Freq: Every day | ORAL | 3 refills | Status: DC

## 2016-09-29 MED ORDER — BISOPROLOL FUMARATE 5 MG PO TABS: 5.0000 mg | ORAL_TABLET | Freq: Every day | ORAL | 2 refills | Status: DC

## 2016-09-30 MED ORDER — DIAZEPAM 5 MG PO TABS: 5.0000 mg | ORAL_TABLET | Freq: Every evening | ORAL | 0 refills | Status: DC | PRN

## 2016-10-01 ENCOUNTER — Encounter: Payer: Self-pay | Admitting: Pulmonary Disease

## 2016-10-20 ENCOUNTER — Encounter: Payer: Self-pay | Admitting: Family Medicine

## 2016-11-04 ENCOUNTER — Ambulatory Visit (INDEPENDENT_AMBULATORY_CARE_PROVIDER_SITE_OTHER): Payer: 59 | Admitting: Family Medicine

## 2016-11-04 ENCOUNTER — Telehealth: Payer: No Typology Code available for payment source | Admitting: Family

## 2016-11-04 ENCOUNTER — Encounter: Payer: Self-pay | Admitting: Family Medicine

## 2016-11-04 VITALS — BP 136/82 | HR 80 | Temp 98.6°F | Resp 14 | Ht 73.0 in | Wt 316.0 lb

## 2016-11-04 DIAGNOSIS — E782 Mixed hyperlipidemia: Secondary | ICD-10-CM | POA: Diagnosis not present

## 2016-11-04 DIAGNOSIS — R7303 Prediabetes: Secondary | ICD-10-CM | POA: Diagnosis not present

## 2016-11-04 DIAGNOSIS — Z6841 Body Mass Index (BMI) 40.0 and over, adult: Secondary | ICD-10-CM

## 2016-11-04 DIAGNOSIS — F411 Generalized anxiety disorder: Secondary | ICD-10-CM | POA: Diagnosis not present

## 2016-11-04 DIAGNOSIS — N410 Acute prostatitis: Secondary | ICD-10-CM

## 2016-11-04 DIAGNOSIS — R002 Palpitations: Secondary | ICD-10-CM

## 2016-11-04 DIAGNOSIS — R300 Dysuria: Secondary | ICD-10-CM

## 2016-11-04 LAB — URINALYSIS, ROUTINE W REFLEX MICROSCOPIC
Bilirubin Urine: NEGATIVE
Glucose, UA: NEGATIVE
Ketones, ur: NEGATIVE
Leukocytes, UA: NEGATIVE
Nitrite: NEGATIVE
Protein, ur: NEGATIVE
Specific Gravity, Urine: 1.03 (ref 1.001–1.035)
pH: 5 (ref 5.0–8.0)

## 2016-11-04 LAB — URINALYSIS, MICROSCOPIC ONLY
Bacteria, UA: NONE SEEN [HPF]
Casts: NONE SEEN [LPF]
Crystals: NONE SEEN [HPF]
Yeast: NONE SEEN [HPF]

## 2016-11-04 MED ORDER — NEBIVOLOL HCL 5 MG PO TABS: 5.0000 mg | ORAL_TABLET | Freq: Every day | ORAL | 0 refills | Status: DC

## 2016-11-04 MED ORDER — CIPROFLOXACIN HCL 500 MG PO TABS: 500.0000 mg | ORAL_TABLET | Freq: Two times a day (BID) | ORAL | 0 refills | Status: DC

## 2016-11-04 MED ORDER — LIRAGLUTIDE -WEIGHT MANAGEMENT 18 MG/3ML ~~LOC~~ SOPN: 0.6000 mg | PEN_INJECTOR | Freq: Every day | SUBCUTANEOUS | 2 refills | Status: DC

## 2016-11-04 NOTE — Progress Notes (Signed)
We are sorry that you are not feeling well.  Here is how we plan to help!  Male bladder infections are not very common.  We worry about prostate or kidney conditions.  The standard of care is to examine the abdomen and kidneys, and to do a urine and blood test to make sure that something more serious is not going on.  We recommend that you see a provider today.  If your doctor's office is closed [facility] has the following Urgent Cares:  If you need care fast and have a high deductible or no insurance consider:   [email or web address]  [phone]  [address], [address] [place], [place] [address] 8 am to 8 pm Monday-Friday 10 am to 4 pm Saturday-Sunday   The following sites will take your  insurance:    . [facility]  [phone] Get Driving Directions Find a Provider at this Location  [address] [place], [place] [address] . 10 am to 8 pm Monday-Friday . 12 pm to 8 pm Saturday-Sunday   . [facility]  [phone] Get Driving Directions Find a Provider at this Location  [address], [address] [place], [place] [address] . 8 am to 8 pm Monday-Friday . 9 am to 6 pm Saturday . 11 am to 6 pm Sunday   .  [facility] [place]  [phone] Get Driving Directions  [address] [place], [place] [address] . 8 am to 8 pm Monday-Friday . 8 am to 4 pm Saturday-Sunday   . [facility] (a walk in primary care provider)  [phone]  Get Driving Directions Find a Provider at this Location  [address] [place], [place] [address] . 8 am to 8:30 pm Monday-Thursday . 8 am to 6 pm Friday . 8 am to 4 pm Saturday-Sunday   Your e-visit answers were reviewed by a board certified advanced clinical practitioner to complete your personal care plan.  Thank you for using e-Visits.   

## 2016-11-04 NOTE — Assessment & Plan Note (Addendum)
Continue buspar,xanax- he does not use regulary . He has valium for muscle spasm/sleep knows NOT to take with xanax

## 2016-11-04 NOTE — Progress Notes (Signed)
Subjective:    Patient ID: Howard Hays, male    DOB: 12-Jul-1975, 41 y.o.   MRN: [id number]  Patient presents for Dysuria (x3 days burning with urination)   Pt here with dysuria for the past 3 days.  No fever, no N/V. Had previous symptoms 4-5 years ago, Saw Urology no enlarged prostate, just infection at that times. No penile discharge   Has appt with sleep study for his OSA  He will briefly found out that palpitations and PVCs were in his family. He however has been concerned about his beta blocker. States that he has had a random thoughts over the years of hurting himself. States that he will have about to jump out of the window as an example. He denies feeling depressed states his anxiety is under good control. This is a very fleeting thoughts he has no plans to hurt himself and occur very randomly he cannot tell me if they occur daily states they have does randomly come over the past 3 years. He started reading his side effects on his medications recently after he had a thought pop in his head he states that his bisoprolol can cause suicidal ideations. He will has actually stopped the medication mobile to be tried on a different beta blocker. He denies any hallucinations  He would also like something to help with his weight. He is concerned he continues to gain, he does splurge and overeat, but does not have a sweet tooth. Yesterday states he had salad for lunch but then had burgery and sausage dog for dinner.He is trying to stay active as well but continues to gain, he was 312lbs last week    Review Of Systems:  GEN- denies fatigue, fever, weight loss,weakness, recent illness HEENT- denies eye drainage, change in vision, nasal discharge, CVS- denies chest pain, palpitations RESP- denies SOB, cough, wheeze ABD- denies N/V, change in stools, abd pain GU- denies dysuria, hematuria, dribbling, incontinence MSK- denies joint pain, muscle aches, injury Neuro- denies headache, dizziness,  syncope, seizure activity       Objective:    BP 136/82   Pulse 80   Temp 98.6 F (37 C) (Oral)   Resp 14   Ht 6\' 1"  (1.854 m)   Wt (!) 316 lb (143.3 kg)   SpO2 97%   BMI 41.69 kg/m  GEN- NAD, alert and oriented x3,obese HEENT- PERRL, EOMI, non injected sclera, pink conjunctiva, MMM, oropharynx clear CVS- RRR, no murmur RESP-CTAB ABD-NABS,soft,NT,ND, no CVA tenderness Psych- normal affect and mood  EXT- No edema Pulses- Radial,2+        Assessment & Plan:      Problem List Items Addressed This Visit    Obesity   Relevant Medications   Liraglutide -Weight Management (SAXENDA) 18 MG/3ML SOPN   Hyperlipidemia   Relevant Medications   nebivolol (BYSTOLIC) 5 MG tablet   Other Relevant Orders   Lipid panel   Heart palpitations    I do not think to the beta blocker is causing the random thoughts that he has had over the years ( less than 1% can cause some depressive symptoms per PI). He adamantly does States he is not depressed and that his anxiety is under good control.  He has no plans to hurt himself. He would like to just try a different medication. I given him samples of bystolic 5 mg to try.  He will return for fasting labs for his borderline diabetes and his hyperlipidemia. Concern with this increased weight with  him developing diabetes. We'll on discussion about nutrition and his exercise pattern. He was to try something to aid with his weight loss states that he's been trying on his own cannot lose the weight. If he does have borderline diabetes we will start with Saxenda. His gases side effects of the medication as well as use, taught by nurse      Borderline diabetes   Relevant Orders   CBC with Differential/Platelet   Comprehensive metabolic panel   Hemoglobin A1c   Anxiety state    Continue buspar,xanax- he does not use regulary . He has valium for muscle spasm/sleep knows NOT to take with xanax       Other Visit Diagnoses    Acute prostatitis    -   Primary   UA few WBC send for culture, discussed UTI rare in male his age, appears he had prostatitis in past, had urology work up. Treat cipro    Relevant Orders   Urinalysis, Routine w reflex microscopic (Completed)   Urine culture      Note: This dictation was prepared with Dragon dictation along with smaller phrase technology. Any transcriptional errors that result from this process are unintentional.

## 2016-11-04 NOTE — Assessment & Plan Note (Addendum)
I do not think to the beta blocker is causing the random thoughts that he has had over the years ( less than 1% can cause some depressive symptoms per PI). He adamantly does States he is not depressed and that his anxiety is under good control.  He has no plans to hurt himself. He would like to just try a different medication. I given him samples of bystolic 5 mg to try.  He will return for fasting labs for his borderline diabetes and his hyperlipidemia. Concern with this increased weight with him developing diabetes. We'll on discussion about nutrition and his exercise pattern. He was to try something to aid with his weight loss states that he's been trying on his own cannot lose the weight. If he does have borderline diabetes we will start with Saxenda. His gases side effects of the medication as well as use, taught by nurse

## 2016-11-04 NOTE — Patient Instructions (Addendum)
Cancel appt for next week Return for labs visit for fasting labs Take the cipro twice a day for 1 week Start weight loss medication after antibiotics Try the bystolic for your PVC COntinue anxiety medication F/U pending results  Start saxenda:  Week 1: 0.6mg  injected daily:   Week 2:  1.2 mg injected daily  Week 3:  1.8mg  injected daily  Week 4:  2.4mg  injected daily  Week 5:  3mg  injected daily

## 2016-11-05 LAB — URINE CULTURE: Organism ID, Bacteria: NO GROWTH

## 2016-11-11 ENCOUNTER — Ambulatory Visit: Payer: Managed Care, Other (non HMO) | Admitting: Family Medicine

## 2016-11-11 ENCOUNTER — Other Ambulatory Visit: Payer: 59

## 2016-11-11 DIAGNOSIS — E782 Mixed hyperlipidemia: Secondary | ICD-10-CM

## 2016-11-11 DIAGNOSIS — R7303 Prediabetes: Secondary | ICD-10-CM

## 2016-11-12 LAB — CBC WITH DIFFERENTIAL/PLATELET
Basophils Absolute: 0 cells/uL (ref 0–200)
Basophils Relative: 0 %
Eosinophils Absolute: 122 cells/uL (ref 15–500)
Eosinophils Relative: 2 %
HCT: 41.9 % (ref 38.5–50.0)
Hemoglobin: 13.2 g/dL (ref 13.0–17.0)
Lymphocytes Relative: 28 %
Lymphs Abs: 1708 cells/uL (ref 850–3900)
MCH: 27.4 pg (ref 27.0–33.0)
MCHC: 31.5 g/dL — ABNORMAL LOW (ref 32.0–36.0)
MCV: 87.1 fL (ref 80.0–100.0)
MPV: 9.8 fL (ref 7.5–12.5)
Monocytes Absolute: 488 cells/uL (ref 200–950)
Monocytes Relative: 8 %
Neutro Abs: 3782 cells/uL (ref 1500–7800)
Neutrophils Relative %: 62 %
Platelets: 261 10*3/uL (ref 140–400)
RBC: 4.81 MIL/uL (ref 4.20–5.80)
RDW: 15.1 % — ABNORMAL HIGH (ref 11.0–15.0)
WBC: 6.1 10*3/uL (ref 3.8–10.8)

## 2016-11-12 LAB — COMPREHENSIVE METABOLIC PANEL
ALT: 41 U/L (ref 9–46)
AST: 24 U/L (ref 10–40)
Albumin: 4.1 g/dL (ref 3.6–5.1)
Alkaline Phosphatase: 87 U/L (ref 40–115)
BUN: 11 mg/dL (ref 7–25)
CO2: 24 mmol/L (ref 20–31)
Calcium: 9.2 mg/dL (ref 8.6–10.3)
Chloride: 104 mmol/L (ref 98–110)
Creat: 1.21 mg/dL (ref 0.60–1.35)
Glucose, Bld: 85 mg/dL (ref 70–99)
Potassium: 4 mmol/L (ref 3.5–5.3)
Sodium: 140 mmol/L (ref 135–146)
Total Bilirubin: 0.4 mg/dL (ref 0.2–1.2)
Total Protein: 7.1 g/dL (ref 6.1–8.1)

## 2016-11-12 LAB — LIPID PANEL
Cholesterol: 168 mg/dL (ref <200)
HDL: 38 mg/dL — ABNORMAL LOW (ref >40)
LDL Cholesterol: 78 mg/dL (ref <100)
Total CHOL/HDL Ratio: 4.4 Ratio (ref <5.0)
Triglycerides: 259 mg/dL — ABNORMAL HIGH (ref <150)
VLDL: 52 mg/dL — ABNORMAL HIGH (ref <30)

## 2016-11-12 LAB — HEMOGLOBIN A1C
Hgb A1c MFr Bld: 6.7 % — ABNORMAL HIGH (ref <5.7)
Mean Plasma Glucose: 146 mg/dL

## 2016-11-14 ENCOUNTER — Ambulatory Visit (INDEPENDENT_AMBULATORY_CARE_PROVIDER_SITE_OTHER): Payer: 59 | Admitting: Pulmonary Disease

## 2016-11-14 ENCOUNTER — Encounter: Payer: Self-pay | Admitting: Pulmonary Disease

## 2016-11-14 ENCOUNTER — Other Ambulatory Visit: Payer: Self-pay | Admitting: *Deleted

## 2016-11-14 VITALS — BP 132/80 | HR 82 | Ht 73.0 in | Wt 314.0 lb

## 2016-11-14 DIAGNOSIS — G4733 Obstructive sleep apnea (adult) (pediatric): Secondary | ICD-10-CM

## 2016-11-14 MED ORDER — BLOOD GLUCOSE TEST VI STRP: ORAL_STRIP | 1 refills | Status: DC

## 2016-11-14 MED ORDER — LANCETS MISC: 1 refills | Status: DC

## 2016-11-14 MED ORDER — LANCET DEVICES MISC: 1 refills | Status: DC

## 2016-11-14 MED ORDER — BLOOD GLUCOSE SYSTEM PAK KIT: PACK | 1 refills | Status: DC

## 2016-11-14 NOTE — Patient Instructions (Signed)
  It was a pleasure taking care of you today!  You are diagnosed with Obstructive Sleep Apnea or OSA.   We will order you an autoCPAP  machine.  Please call the office if you do NOT receive your machine in the next 1-2 weeks.   Please make sure you use your CPAP device everytime you sleep.  We will monitor the usage of your machine per your insurance requirement.  Your insurance company may take the machine from you if you are not using it regularly.   Please clean the mask, tubings, filter, water reservoir with soapy water every week.  Please use distilled water for the water reservoir.   Please call the office or your machine provider (DME company) if you are having issues with the device.   Return to clinic in 8 weeks with  NP.

## 2016-11-14 NOTE — Assessment & Plan Note (Addendum)
Pt was diagnosed with sleep apnea in 2011-2012. He was very symptomatic. He was snoring, gasping, had witnessed apneas, hypersomnia. He had a home study which showed his AHI was 60. He was placed on CPAP therapy. He is on autocpap 7-17 cm water. He has been using his machine since 2012. He significantly improved with CPAP use.   Through the years, machine has lost its luster. Sometimes machine is not delivering enough pressure. He is starting to get sleepy again. Hypersomnia affecting his functionality.  He brought his machine today. Download the last month: 97%, AHI 0.4, CPAP 13 cm water.   ESS 6. (-) abnormal in sleep.   Plan:  We extensively discussed the diagnosis, pathophysiology, and treatment options for Obstructive Sleep Apnea (OSA).  We discussed treatment options for OSA including CPAP, BiPaP, as well as surgical options and oral devices.   We will start patient on autocpap 7-17 cm water. He currently uses nasal mask and he wants to try nasal pillows. We will order a mask fit. We'll get a 1 month download. His current machine is beginning to be malfunctioning. Not delivering enough pressure times. He has been compliant. He feels benefit of CPAP. Hopefully, we can get a new machine without repeating the sleep study.  Patient was instructed to call the office if he/she has not received the PAP device in 1-2 weeks.  Patient was instructed to have mask, tubings, filter, reservoir cleaned at least once a week with soapy water.  Patient was instructed to call the office if he/she is having issues with the PAP device.    I advised patient to obtain sufficient amount of sleep --  7 to 8 hours at least in a 24 hr period.  Patient was advised to follow good sleep hygiene.  Patient was advised NOT to engage in activities requiring concentration and/or vigilance if he/she is and  sleepy.  Patient is NOT to drive if he/she is sleepy.
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
## 2016-11-17 ENCOUNTER — Encounter: Payer: Self-pay | Admitting: Family Medicine

## 2016-11-18 ENCOUNTER — Encounter: Payer: Self-pay | Admitting: Family Medicine

## 2016-11-18 MED ORDER — BISOPROLOL FUMARATE 5 MG PO TABS: 5.0000 mg | ORAL_TABLET | Freq: Every day | ORAL | 2 refills | Status: DC

## 2016-11-26 ENCOUNTER — Telehealth: Payer: Self-pay | Admitting: Pulmonary Disease

## 2016-11-26 NOTE — Telephone Encounter (Signed)
Forwarding to [facility] basket per protocol   

## 2016-11-26 NOTE — Telephone Encounter (Signed)
Sent message to [facility] to get status Howard Hays

## 2016-11-27 NOTE — Telephone Encounter (Signed)
Pt calling back this morning b/c he didn't receive call back from anyone yesterday.Howard Hays

## 2016-11-27 NOTE — Telephone Encounter (Signed)
Pt use to be a pt of [facility] and he is in collections with them and has filed bankrupsy so the can not take him back as a pt will

## 2016-11-27 NOTE — Telephone Encounter (Signed)
I will ck to see if he can go to another homecare Howard Hays

## 2016-11-28 NOTE — Telephone Encounter (Signed)
I have spoken to pt he is aware we need his old sleep study to proceed I have not been able to locate it but an still looking pt is aware Howard Hays

## 2016-11-28 NOTE — Telephone Encounter (Signed)
PCC's please advise once this has been addressed.  Thanks

## 2016-12-01 ENCOUNTER — Encounter: Payer: Self-pay | Admitting: Family Medicine

## 2016-12-01 MED ORDER — INSULIN PEN NEEDLE 31G X 6 MM MISC: 1 refills | Status: DC

## 2016-12-02 MED ORDER — INSULIN PEN NEEDLE 32G X 6 MM MISC: 1 refills | Status: DC

## 2016-12-02 NOTE — Addendum Note (Signed)
Addended by: Sheral Flow on: 12/02/2016 04:24 PM   Modules accepted: Orders

## 2016-12-09 ENCOUNTER — Telehealth: Payer: Self-pay | Admitting: Pulmonary Disease

## 2016-12-09 DIAGNOSIS — G4733 Obstructive sleep apnea (adult) (pediatric): Secondary | ICD-10-CM

## 2016-12-09 NOTE — Telephone Encounter (Signed)
CPAP order was sent to [facility] but per note from 6/6 [facility] can not take pt now.  I called & spoke to [facility] [place].  She is going to look for sleep study & she will talk to [facility] & then she will call me back.  I called pt & told him this is being researched & someone will call him back.

## 2016-12-10 NOTE — Telephone Encounter (Signed)
Howard Hays came to triage and asked for a new order to be placed for pt's CPAP. This has been taken care of.

## 2016-12-10 NOTE — Telephone Encounter (Signed)
I have spoken with Jeani Hawking with [facility], who states she has fax over sleep study. It does not appear that we have received sleep study. I have requested that sleep study be re faxed to [facility] fax machine. Will await fax to be received.

## 2016-12-10 NOTE — Telephone Encounter (Signed)
[facility] faxed over pt's sleep study from 06/2010.  I spoke to pt & he asked order be sent to [facility] put in new order for cpap & I have sent to [facility] per pt's request.  I explained to him since sleep study is from 2012 a new study may be needed.  Nothing further needed at this time.

## 2016-12-23 ENCOUNTER — Encounter: Payer: Self-pay | Admitting: Family Medicine

## 2016-12-30 ENCOUNTER — Telehealth: Payer: Self-pay | Admitting: Pulmonary Disease

## 2016-12-30 NOTE — Telephone Encounter (Signed)
I called and spoke with [facility] about this CPAP order and they stated the patient is setup to come in 01/01/2017 @ 11:00am. I then called Howard Hays about his phone call and he confirmed  that yes they had contacted him and he is aware of his setup appt on 01/01/2017. Nothing further needed at this time

## 2017-01-01 ENCOUNTER — Encounter: Payer: Self-pay | Admitting: Family Medicine

## 2017-01-01 DIAGNOSIS — G4733 Obstructive sleep apnea (adult) (pediatric): Secondary | ICD-10-CM | POA: Diagnosis not present

## 2017-01-02 MED ORDER — LINACLOTIDE 145 MCG PO CAPS: 145.0000 ug | ORAL_CAPSULE | Freq: Every day | ORAL | 1 refills | Status: DC

## 2017-01-02 NOTE — Telephone Encounter (Signed)
Please review attached MyChart message.   Received call from patient. States that he was able to pass small amount of watery stool this morning, but is still not having effective BM.   MD please advise.

## 2017-01-13 ENCOUNTER — Ambulatory Visit: Payer: 59 | Admitting: Adult Health

## 2017-01-13 ENCOUNTER — Ambulatory Visit (INDEPENDENT_AMBULATORY_CARE_PROVIDER_SITE_OTHER): Payer: 59 | Admitting: Family Medicine

## 2017-01-13 ENCOUNTER — Encounter: Payer: Self-pay | Admitting: Family Medicine

## 2017-01-13 VITALS — BP 124/78 | HR 70 | Temp 98.6°F | Resp 14 | Ht 73.0 in | Wt 303.0 lb

## 2017-01-13 DIAGNOSIS — I493 Ventricular premature depolarization: Secondary | ICD-10-CM

## 2017-01-13 DIAGNOSIS — Z23 Encounter for immunization: Secondary | ICD-10-CM

## 2017-01-13 DIAGNOSIS — Z6839 Body mass index (BMI) 39.0-39.9, adult: Secondary | ICD-10-CM | POA: Diagnosis not present

## 2017-01-13 DIAGNOSIS — E66812 Obesity, class 2: Secondary | ICD-10-CM

## 2017-01-13 DIAGNOSIS — E119 Type 2 diabetes mellitus without complications: Secondary | ICD-10-CM | POA: Diagnosis not present

## 2017-01-13 MED ORDER — ALPRAZOLAM 0.5 MG PO TABS: 0.5000 mg | ORAL_TABLET | Freq: Two times a day (BID) | ORAL | 2 refills | Status: DC | PRN

## 2017-01-13 MED ORDER — METFORMIN HCL 500 MG PO TABS: 500.0000 mg | ORAL_TABLET | Freq: Every day | ORAL | 1 refills | Status: DC

## 2017-01-13 NOTE — Assessment & Plan Note (Signed)
Unable to tolerate, at this time he is leaving for [place] for 2 months Start metformin 500mg  daily  Check CBG fasting  Pneumonia vaccine done

## 2017-01-13 NOTE — Assessment & Plan Note (Signed)
Discussed diet, low carb, low sugar Regular exercise  Plan to check labs when he returns in Oct

## 2017-01-13 NOTE — Assessment & Plan Note (Signed)
Normal exam today, still think anxiety, weight, tied to PVC Continue zebeta

## 2017-01-13 NOTE — Patient Instructions (Addendum)
Take metformin once a day  Continue all other medications Pneumonia shot given  F/U Oct -labs will be done

## 2017-01-13 NOTE — Progress Notes (Signed)
   Subjective:    Patient ID: Howard Hays, male    DOB: [birth year], 41 y.o.   MRN: [id number]  Patient presents for Follow-up (has stopped Saxenda D/T GI Upset- never started Linzess for constipation)   aNXIETY- off buspar, rare use of Valium , uses as needed xanax , needs refill on this  PVC- some days none at all, other days cant tell a difference with the beta blocker but still taking  DM- last A1C 6.7% in May, started on Saxenda to help with blood sugar and weight loss, was doing well until he got to dose of 3mg  then had severe diarrhea, GI upset, he went down to 1.8mg  but still has some effects and constipation wihtout having taking any immodium. He stopped the medication. At home weight was down to 290lbs. He then went on vacation and gained 7 lb per pt, was eating a lot. Weight this AM 297lbs  Family history of diabetes      Review Of Systems:  GEN- denies fatigue, fever, weight loss,weakness, recent illness HEENT- denies eye drainage, change in vision, nasal discharge, CVS- denies chest pain, palpitations RESP- denies SOB, cough, wheeze ABD- denies N/V, change in stools, abd pain GU- denies dysuria, hematuria, dribbling, incontinence MSK- denies joint pain, muscle aches, injury Neuro- denies headache, dizziness, syncope, seizure activity       Objective:    BP 124/78   Pulse 70   Temp 98.6 F (37 C) (Oral)   Resp 14   Ht 6\' 1"  (1.854 m)   Wt (!) 303 lb (137.4 kg)   SpO2 96%   BMI 39.98 kg/m  GEN- NAD, alert and oriented x3 HEENT- PERRL, EOMI, non injected sclera, pink conjunctiva, MMM, oropharynx clear Neck- Supple, no thyromegaly CVS- RRR, no murmur RESP-CTAB ABD-NABS,soft,NT,ND EXT- No edema Pulses- Radial, DP- 2+        Assessment & Plan:      Problem List Items Addressed This Visit    Obesity    Discussed diet, low carb, low sugar Regular exercise  Plan to check labs when he returns in Oct      Relevant Medications   metFORMIN  (GLUCOPHAGE) 500 MG tablet   Frequent PVCs - Primary    Normal exam today, still think anxiety, weight, tied to PVC Continue zebeta      Diabetes mellitus, type II ([place])    Unable to tolerate, at this time he is leaving for [place] for 2 months Start metformin 500mg  daily  Check CBG fasting  Pneumonia vaccine done       Relevant Medications   metFORMIN (GLUCOPHAGE) 500 MG tablet   Other Relevant Orders   HM DIABETES FOOT EXAM (Completed)      Note: This dictation was prepared with Dragon dictation along with smaller phrase technology. Any transcriptional errors that result from this process are unintentional.

## 2017-01-22 ENCOUNTER — Encounter: Payer: Self-pay | Admitting: Family Medicine

## 2017-01-26 MED ORDER — METFORMIN HCL 500 MG PO TABS: 500.0000 mg | ORAL_TABLET | Freq: Every day | ORAL | 1 refills | Status: DC

## 2017-01-26 MED ORDER — ATORVASTATIN CALCIUM 40 MG PO TABS: 40.0000 mg | ORAL_TABLET | Freq: Every day | ORAL | 3 refills | Status: DC

## 2017-01-26 MED ORDER — BISOPROLOL FUMARATE 5 MG PO TABS: 5.0000 mg | ORAL_TABLET | Freq: Every day | ORAL | 2 refills | Status: DC

## 2017-02-01 DIAGNOSIS — G4733 Obstructive sleep apnea (adult) (pediatric): Secondary | ICD-10-CM | POA: Diagnosis not present

## 2017-02-12 DIAGNOSIS — J029 Acute pharyngitis, unspecified: Secondary | ICD-10-CM | POA: Diagnosis not present

## 2017-02-26 ENCOUNTER — Encounter: Payer: Self-pay | Admitting: Family Medicine

## 2017-03-04 DIAGNOSIS — G4733 Obstructive sleep apnea (adult) (pediatric): Secondary | ICD-10-CM | POA: Diagnosis not present

## 2017-03-19 DIAGNOSIS — R300 Dysuria: Secondary | ICD-10-CM | POA: Diagnosis not present

## 2017-03-26 ENCOUNTER — Encounter: Payer: Self-pay | Admitting: Physician Assistant

## 2017-03-26 ENCOUNTER — Ambulatory Visit (INDEPENDENT_AMBULATORY_CARE_PROVIDER_SITE_OTHER): Payer: 59 | Admitting: Physician Assistant

## 2017-03-26 VITALS — BP 132/84 | HR 82 | Temp 97.9°F | Resp 14 | Ht 73.0 in | Wt 303.0 lb

## 2017-03-26 DIAGNOSIS — R1030 Lower abdominal pain, unspecified: Secondary | ICD-10-CM

## 2017-03-26 DIAGNOSIS — R300 Dysuria: Secondary | ICD-10-CM

## 2017-03-26 LAB — CBC WITH DIFFERENTIAL/PLATELET
Basophils Absolute: 29 cells/uL (ref 0–200)
Basophils Relative: 0.5 %
Eosinophils Absolute: 70 cells/uL (ref 15–500)
Eosinophils Relative: 1.2 %
HCT: 40.7 % (ref 38.5–50.0)
Hemoglobin: 13.5 g/dL (ref 13.2–17.1)
Lymphs Abs: 1612 cells/uL (ref 850–3900)
MCH: 28.6 pg (ref 27.0–33.0)
MCHC: 33.2 g/dL (ref 32.0–36.0)
MCV: 86.2 fL (ref 80.0–100.0)
MPV: 10.4 fL (ref 7.5–12.5)
Monocytes Relative: 8.2 %
Neutro Abs: 3613 cells/uL (ref 1500–7800)
Neutrophils Relative %: 62.3 %
Platelets: 258 10*3/uL (ref 140–400)
RBC: 4.72 10*6/uL (ref 4.20–5.80)
RDW: 13.9 % (ref 11.0–15.0)
Total Lymphocyte: 27.8 %
WBC mixed population: 476 cells/uL (ref 200–950)
WBC: 5.8 10*3/uL (ref 3.8–10.8)

## 2017-03-26 LAB — URINALYSIS, ROUTINE W REFLEX MICROSCOPIC
Bacteria, UA: NONE SEEN /HPF
Bilirubin Urine: NEGATIVE
Glucose, UA: NEGATIVE
Ketones, ur: NEGATIVE
Leukocytes, UA: NEGATIVE
Nitrite: NEGATIVE
Specific Gravity, Urine: 1.029 (ref 1.001–1.03)
pH: 5.5 (ref 5.0–8.0)

## 2017-03-26 LAB — COMPLETE METABOLIC PANEL WITH GFR
AG Ratio: 1.2 (calc) (ref 1.0–2.5)
ALT: 33 U/L (ref 9–46)
AST: 21 U/L (ref 10–40)
Albumin: 4 g/dL (ref 3.6–5.1)
Alkaline phosphatase (APISO): 73 U/L (ref 40–115)
BUN: 13 mg/dL (ref 7–25)
CO2: 26 mmol/L (ref 20–32)
Calcium: 9 mg/dL (ref 8.6–10.3)
Chloride: 105 mmol/L (ref 98–110)
Creat: 1.23 mg/dL (ref 0.60–1.35)
GFR, Est African American: 84 mL/min/{1.73_m2} (ref >=60)
GFR, Est Non African American: 72 mL/min/{1.73_m2} (ref >=60)
Globulin: 3.3 g/dL (calc) (ref 1.9–3.7)
Glucose, Bld: 117 mg/dL — ABNORMAL HIGH (ref 65–99)
Potassium: 4.1 mmol/L (ref 3.5–5.3)
Sodium: 140 mmol/L (ref 135–146)
Total Bilirubin: 0.4 mg/dL (ref 0.2–1.2)
Total Protein: 7.3 g/dL (ref 6.1–8.1)

## 2017-03-26 LAB — MICROSCOPIC MESSAGE

## 2017-03-26 LAB — PSA: PSA: 0.3 ng/mL (ref <=4.0)

## 2017-03-26 NOTE — Progress Notes (Signed)
Patient ID: Curly Mackowski MRN: [id number], DOB: April 02, 1976, 41 y.o. Date of Encounter: 03/26/2017, 12:03 PM    Chief Complaint:  Chief Complaint  Patient presents with  . Lower Abd Pain    states that he has had pain before and it was a UTI- dull pain in lower abd- has been seen at [facility] in [place] and UA was neg- urinary frequency     HPI: 41 y.o. year old male presents with above.   Patient reports that he first noticed this discomfort while he was at work --was standing-- and "felt like his bladder was full". Says that that was about 9 days ago. Says then the following day he did not feel any discomfort in that area but then the following day after that it did return. Points to midline low abdomen in suprapubic region as area of this discomfort/sensation. However says that when he pressed on that area with his hand/fingers, it did not reproduce pain. Says it has been intermittent since then. Says he did feel it some yesterday but has felt no discomfort today.  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 However does take it sometimes when he will be near the restroom etc. Doesn't know if the symptoms are caused by the metformin.  He has had no other areas of pain. No other areas of abdominal discomfort. Reports that he is having no pain radiating down into his testicles. Is having no dysuria. Has had no fevers or chills. No pain in the perennial and area to suggest prostate as cause of symptoms.     Home Meds:   Outpatient Medications Prior to Visit  Medication Sig Dispense Refill  . ALPRAZolam (XANAX) 0.5 MG tablet Take 1 tablet (0.5 mg total) by mouth 2 (two) times daily as needed for anxiety. 30 tablet 2  . aspirin 81 MG tablet Take 81 mg by mouth daily.      Marland Kitchen atorvastatin (LIPITOR) 40 MG tablet Take 1 tablet (40 mg total) by mouth daily. 90  tablet 3  . bisoprolol (ZEBETA) 5 MG tablet Take 1 tablet (5 mg total) by mouth daily. 90 tablet 2  . Blood Glucose Monitoring Suppl (BLOOD GLUCOSE SYSTEM PAK) KIT Please dispense per patient and insurance preference. Use as directed to monitor FSBS 1x daily. Dx: E11.9 1 each 1  . diazepam (VALIUM) 5 MG tablet Take 1 tablet (5 mg total) by mouth at bedtime as needed for anxiety. For muscle spasm 45 tablet 0  . Glucose Blood (BLOOD GLUCOSE TEST STRIPS) STRP Please dispense per patient and insurance preference. Use as directed to monitor FSBS 1x daily. Dx: E11.9 100 each 1  . Insulin Pen Needle (NOVOFINE) 32G X 6 MM MISC Use as directed with Saxenda QD. 100 each 1  . Lancet Devices MISC Please dispense per patient and insurance preference. Use as directed to monitor FSBS 1x daily. Dx: E11.9 1 each 1  . Lancets MISC Please dispense per patient and insurance preference. Use as directed to monitor FSBS 1x daily. Dx: E11.9 1 each 1  . metFORMIN (GLUCOPHAGE) 500 MG tablet Take 1 tablet (500 mg total) by mouth daily with breakfast. 90 tablet 1  . Multiple Vitamins-Minerals (MULTIVITAMIN WITH MINERALS) tablet Take 1 tablet by mouth daily.    . naproxen (NAPROSYN) 500 MG tablet Take 1 tablet (500  mg total) by mouth 2 (two) times daily. 60 tablet 1   No facility-administered medications prior to visit.     Allergies:  Allergies  Allergen Reactions  . Meclizine       Review of Systems: See HPI for pertinent ROS. All other ROS negative.    Physical Exam: Blood pressure 132/84, pulse 82, temperature 97.9 F (36.6 C), temperature source Oral, resp. rate 14, height _0  (1.854 m), weight (!) 137.4 kg (303 lb), SpO2 97 %., Body mass index is 39.98 kg/m. General:  AAM. Appears in no acute distress. Neck: Supple. No thyromegaly. No lymphadenopathy. Lungs: Clear bilaterally to auscultation without wheezes, rales, or rhonchi. Breathing is unlabored. Heart: Regular rhythm. No murmurs, rubs, or  gallops. Abdomen: Soft, non-tender, non-distended with normoactive bowel sounds. No hepatomegaly. No rebound/guarding. No obvious abdominal masses. There is no area of tenderness with palpation even down towards the suprapubic region. Msk:  Strength and tone normal for age. Extremities/Skin: Warm and dry.  Neuro: Alert and oriented X 3. Moves all extremities spontaneously. Gait is normal. CNII-XII grossly in tact. Psych:  Responds to questions appropriately with a normal affect.     ASSESSMENT AND PLAN:  41 y.o. year old male with  1. Lower abdominal pain Discussed with him that urinalysis shows no UTI. Will obtain labs.  He is to hold the metformin for 2 weeks to see if the symptoms resolve when off the metformin. If symptoms worsen or persist then follow up. As well we will follow-up with him once we get lab results. - CBC with Differential/Platelet - COMPLETE METABOLIC PANEL WITH GFR - PSA  2. Dysuria - Urinalysis, Routine w reflex microscopic   Signed, [address] [place], [place], [facility] 03/26/2017 12:03 PM

## 2017-03-27 ENCOUNTER — Other Ambulatory Visit: Payer: Self-pay | Admitting: *Deleted

## 2017-03-27 DIAGNOSIS — R300 Dysuria: Secondary | ICD-10-CM

## 2017-03-27 DIAGNOSIS — R1030 Lower abdominal pain, unspecified: Secondary | ICD-10-CM

## 2017-03-27 DIAGNOSIS — R3129 Other microscopic hematuria: Secondary | ICD-10-CM

## 2017-03-30 ENCOUNTER — Encounter: Payer: Self-pay | Admitting: Family Medicine

## 2017-04-02 ENCOUNTER — Encounter: Payer: Self-pay | Admitting: Family Medicine

## 2017-04-03 DIAGNOSIS — G4733 Obstructive sleep apnea (adult) (pediatric): Secondary | ICD-10-CM | POA: Diagnosis not present

## 2017-04-06 ENCOUNTER — Encounter: Payer: Self-pay | Admitting: Family Medicine

## 2017-04-06 ENCOUNTER — Encounter: Payer: Self-pay | Admitting: Adult Health

## 2017-04-06 ENCOUNTER — Ambulatory Visit (INDEPENDENT_AMBULATORY_CARE_PROVIDER_SITE_OTHER): Payer: 59 | Admitting: Family Medicine

## 2017-04-06 ENCOUNTER — Ambulatory Visit
Admission: RE | Admit: 2017-04-06 | Discharge: 2017-04-06 | Disposition: A | Discharge status: Home / Self Care | Payer: 59 | Source: Ambulatory Visit | Attending: Family Medicine | Admitting: Family Medicine

## 2017-04-06 ENCOUNTER — Ambulatory Visit (INDEPENDENT_AMBULATORY_CARE_PROVIDER_SITE_OTHER): Payer: 59 | Admitting: Adult Health

## 2017-04-06 VITALS — BP 132/70 | HR 68 | Temp 98.7°F | Resp 16 | Ht 73.0 in | Wt 304.0 lb

## 2017-04-06 DIAGNOSIS — R109 Unspecified abdominal pain: Secondary | ICD-10-CM | POA: Diagnosis not present

## 2017-04-06 DIAGNOSIS — Z6841 Body Mass Index (BMI) 40.0 and over, adult: Secondary | ICD-10-CM

## 2017-04-06 DIAGNOSIS — E119 Type 2 diabetes mellitus without complications: Secondary | ICD-10-CM

## 2017-04-06 DIAGNOSIS — G4733 Obstructive sleep apnea (adult) (pediatric): Secondary | ICD-10-CM | POA: Diagnosis not present

## 2017-04-06 DIAGNOSIS — R3129 Other microscopic hematuria: Secondary | ICD-10-CM | POA: Diagnosis not present

## 2017-04-06 DIAGNOSIS — Z6839 Body mass index (BMI) 39.0-39.9, adult: Secondary | ICD-10-CM

## 2017-04-06 DIAGNOSIS — E782 Mixed hyperlipidemia: Secondary | ICD-10-CM

## 2017-04-06 DIAGNOSIS — R1030 Lower abdominal pain, unspecified: Secondary | ICD-10-CM

## 2017-04-06 DIAGNOSIS — R319 Hematuria, unspecified: Secondary | ICD-10-CM | POA: Diagnosis not present

## 2017-04-06 DIAGNOSIS — Z23 Encounter for immunization: Secondary | ICD-10-CM

## 2017-04-06 LAB — URINALYSIS, ROUTINE W REFLEX MICROSCOPIC
Bilirubin Urine: NEGATIVE
Glucose, UA: NEGATIVE
Hgb urine dipstick: NEGATIVE
Ketones, ur: NEGATIVE
Leukocytes, UA: NEGATIVE
Nitrite: NEGATIVE
Protein, ur: NEGATIVE
Specific Gravity, Urine: 1.02 (ref 1.001–1.03)
pH: 5.5 (ref 5.0–8.0)
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
## 2017-04-06 NOTE — Assessment & Plan Note (Signed)
Diabetes has been well-controlled with concern for his weight gainand him not being on the metformin consistently. We'll recheck his levels today goal is less than 7%

## 2017-04-06 NOTE — Progress Notes (Signed)
   Subjective:    Patient ID: Howard Hays, male    DOB: 01-14-1976, 41 y.o.   MRN: [id number]  Patient presents for Follow-up  atient here to follow-up on her medical problems.- Diabetes mellitus his last A1c was 6.7% he is working on weight loss but did not tolerate the Saxenda this was discontinued he was then started on metformin P axis not been consistent with the metformin he would have some diarrhea episodes. He has been off for the past couple weeks.  Sleep apnea he is still following with his pulmonologist for this  Obesity he is back on weight.  He's had intermittent abdominal pain states he was treated in [place] for urinary tract infection that he was seen in our office on October 4 with intermittent lower abdominal pain and flank painhis UA that time showed microscopic hematuria. He had labs done which showed a normal CBC metabolic and PSAand he was referred to urology. Today he still states he gets intermittent episodes no specific reason he denies any diarrhea stools denies any nausea or vomiting. He does have history of kidney stones in the past.  He states that stressors have improved at [facility] does use the Xanax Review Of Systems:  GEN- denies fatigue, fever, weight loss,weakness, recent illness HEENT- denies eye drainage, change in vision, nasal discharge, CVS- denies chest pain, palpitations RESP- denies SOB, cough, wheeze ABD- denies N/V, change in stools, +abd pain GU- denies dysuria, hematuria, dribbling, incontinence MSK- denies joint pain, muscle aches, injury Neuro- denies headache, dizziness, syncope, seizure activity       Objective:    BP 132/70   Pulse 68   Temp 98.7 F (37.1 C) (Oral)   Resp 16   Ht 6\' 1"  (1.854 m)   Wt (!) 304 lb (137.9 kg)   SpO2 96%   BMI 40.11 kg/m  GEN- NAD, alert and oriented x3,obese HEENT- PERRL, EOMI, non injected sclera, pink conjunctiva, MMM, oropharynx clear Neck- Supple, no LAD CVS- RRR, no  murmur RESP-CTAB ABD-NABS,soft,NT,ND, no CVA tenderness  EXT- No edema Pulses- Radial 2+        Assessment & Plan:      Problem List Items Addressed This Visit      Unprioritized   Hyperlipidemia   Relevant Orders   Lipid panel   Obesity   Diabetes mellitus, type II ([place])    Diabetes has been well-controlled with concern for his weight gainand him not being on the metformin consistently. We'll recheck his levels today goal is less than 7%      Relevant Orders   CBC with Differential/Platelet   Basic metabolic panel   Hemoglobin A1c    Other Visit Diagnoses    Left flank pain    -  Primary   differentials include kidney stone, persistant bladder infection or prostatitis, colon etiology though no diarrhea. Obtain CT scan. TO FURTHER evaluate    Relevant Orders   Urinalysis, Routine w reflex microscopic (Completed)   Lower abdominal pain       Relevant Orders   Urinalysis, Routine w reflex microscopic (Completed)   CT Abdomen Pelvis Wo Contrast (Completed)   Microscopic hematuria       Relevant Orders   Urinalysis, Routine w reflex microscopic (Completed)   CBC with Differential/Platelet      Note: This dictation was prepared with Dragon dictation along with smaller phrase technology. Any transcriptional errors that result from this process are unintentional.

## 2017-04-06 NOTE — Patient Instructions (Signed)
F/U  Pending results CT scan to be done

## 2017-04-06 NOTE — Progress Notes (Signed)
I have reviewed and agree with assessment/plan.  Chesley Mires, MD [facility] Pulmonary/Critical Care 04/06/2017, 4:04 PM Pager:  [phone]

## 2017-04-06 NOTE — Assessment & Plan Note (Signed)
Wt loss  

## 2017-04-06 NOTE — Assessment & Plan Note (Signed)
Severe sleep apnea, well controlled on C Pap at bedtime  Plan  Patient Instructions  Continue on C Pap at bedtime Work on healthy weight. Do not drive if sleepy Follow-up in one year with Dr. Halford Chessman or Aj Crunkleton NP and As needed   Flu shot .

## 2017-04-06 NOTE — Patient Instructions (Addendum)
Continue on C Pap at bedtime Work on healthy weight. Do not drive if sleepy Follow-up in one year with Dr. Halford Chessman or Johnatan Baskette NP and As needed   Flu shot .

## 2017-04-07 ENCOUNTER — Telehealth: Payer: Self-pay | Admitting: Family Medicine

## 2017-04-07 LAB — CBC WITH DIFFERENTIAL/PLATELET
Basophils Absolute: 33 cells/uL (ref 0–200)
Basophils Relative: 0.5 %
Eosinophils Absolute: 52 cells/uL (ref 15–500)
Eosinophils Relative: 0.8 %
HCT: 43.9 % (ref 38.5–50.0)
Hemoglobin: 14.8 g/dL (ref 13.2–17.1)
Lymphs Abs: 1521 cells/uL (ref 850–3900)
MCH: 28.7 pg (ref 27.0–33.0)
MCHC: 33.7 g/dL (ref 32.0–36.0)
MCV: 85.2 fL (ref 80.0–100.0)
MPV: 10.3 fL (ref 7.5–12.5)
Monocytes Relative: 7.2 %
Neutro Abs: 4427 cells/uL (ref 1500–7800)
Neutrophils Relative %: 68.1 %
Platelets: 277 10*3/uL (ref 140–400)
RBC: 5.15 10*6/uL (ref 4.20–5.80)
RDW: 13.9 % (ref 11.0–15.0)
Total Lymphocyte: 23.4 %
WBC mixed population: 468 cells/uL (ref 200–950)
WBC: 6.5 10*3/uL (ref 3.8–10.8)

## 2017-04-07 LAB — BASIC METABOLIC PANEL
BUN: 11 mg/dL (ref 7–25)
CO2: 27 mmol/L (ref 20–32)
Calcium: 9.4 mg/dL (ref 8.6–10.3)
Chloride: 103 mmol/L (ref 98–110)
Creat: 1.08 mg/dL (ref 0.60–1.35)
Glucose, Bld: 94 mg/dL (ref 65–99)
Potassium: 4.3 mmol/L (ref 3.5–5.3)
Sodium: 140 mmol/L (ref 135–146)

## 2017-04-07 LAB — LIPID PANEL
Cholesterol: 189 mg/dL (ref <200)
HDL: 39 mg/dL — ABNORMAL LOW (ref >40)
LDL Cholesterol (Calc): 109 mg/dL (calc) — ABNORMAL HIGH
Non-HDL Cholesterol (Calc): 150 mg/dL (calc) — ABNORMAL HIGH (ref <130)
Total CHOL/HDL Ratio: 4.8 (calc) (ref <5.0)
Triglycerides: 304 mg/dL — ABNORMAL HIGH (ref <150)

## 2017-04-07 LAB — HEMOGLOBIN A1C
Hgb A1c MFr Bld: 6.2 % of total Hgb — ABNORMAL HIGH (ref <5.7)
Mean Plasma Glucose: 131 (calc)
eAG (mmol/L): 7.3 (calc)

## 2017-04-07 NOTE — Telephone Encounter (Signed)
-----   Message from Alycia Rossetti, MD sent at 04/06/2017  6:06 PM EDT ----- Regarding: [facility] REFERRAL  Hematuria has cleared

## 2017-04-07 NOTE — Telephone Encounter (Signed)
[facility] notified

## 2017-04-13 ENCOUNTER — Other Ambulatory Visit: Payer: Self-pay | Admitting: *Deleted

## 2017-04-13 MED ORDER — ATORVASTATIN CALCIUM 80 MG PO TABS: 80.0000 mg | ORAL_TABLET | Freq: Every day | ORAL | 3 refills | Status: DC

## 2017-04-13 MED ORDER — EMPAGLIFLOZIN 25 MG PO TABS: 25.0000 mg | ORAL_TABLET | Freq: Every day | ORAL | 1 refills | Status: DC

## 2017-04-14 ENCOUNTER — Other Ambulatory Visit: Payer: Self-pay | Admitting: *Deleted

## 2017-04-14 MED ORDER — BISOPROLOL FUMARATE 5 MG PO TABS: 5.0000 mg | ORAL_TABLET | Freq: Every day | ORAL | 2 refills | Status: DC

## 2017-04-14 MED ORDER — ALPRAZOLAM 0.5 MG PO TABS: 0.5000 mg | ORAL_TABLET | Freq: Two times a day (BID) | ORAL | 2 refills | Status: DC | PRN

## 2017-04-14 NOTE — Telephone Encounter (Signed)
Patient in office to pick up samples.   Requested refill on Zebeta and Xanax.   Ok to refill xanax??  Last office visit 04/06/2017.  Last refill 01/13/2017, #2 refills.

## 2017-04-14 NOTE — Telephone Encounter (Signed)
Medication called to pharmacy. 

## 2017-04-14 NOTE — Telephone Encounter (Signed)
Okay to refill xanax and zebeta

## 2017-04-28 ENCOUNTER — Encounter: Payer: Self-pay | Admitting: Family Medicine

## 2017-05-04 DIAGNOSIS — G4733 Obstructive sleep apnea (adult) (pediatric): Secondary | ICD-10-CM | POA: Diagnosis not present

## 2017-05-19 ENCOUNTER — Encounter: Payer: Self-pay | Admitting: Family Medicine

## 2017-05-20 MED ORDER — ATORVASTATIN CALCIUM 80 MG PO TABS: 80.0000 mg | ORAL_TABLET | Freq: Every day | ORAL | 0 refills | Status: DC

## 2017-05-20 MED ORDER — BISOPROLOL FUMARATE 5 MG PO TABS: 5.0000 mg | ORAL_TABLET | Freq: Every day | ORAL | 0 refills | Status: DC

## 2017-05-20 MED ORDER — EMPAGLIFLOZIN 25 MG PO TABS: 25.0000 mg | ORAL_TABLET | Freq: Every day | ORAL | 0 refills | Status: DC

## 2017-05-20 NOTE — Addendum Note (Signed)
Addended by: Sheral Flow on: 05/20/2017 12:49 PM   Modules accepted: Orders

## 2017-05-21 MED ORDER — ALPRAZOLAM 0.5 MG PO TABS: 0.5000 mg | ORAL_TABLET | Freq: Two times a day (BID) | ORAL | 2 refills | Status: DC | PRN

## 2017-05-21 NOTE — Addendum Note (Signed)
Addended by: Sheral Flow on: 05/21/2017 11:39 AM   Modules accepted: Orders

## 2017-06-03 DIAGNOSIS — G4733 Obstructive sleep apnea (adult) (pediatric): Secondary | ICD-10-CM | POA: Diagnosis not present

## 2017-06-05 ENCOUNTER — Encounter: Payer: Self-pay | Admitting: Family Medicine

## 2017-06-10 ENCOUNTER — Telehealth: Payer: Self-pay | Admitting: *Deleted

## 2017-06-10 NOTE — Telephone Encounter (Signed)
Received fax requesting alternative to Bisoprolol. Advised that all strengths are on back order.   MD please advise.

## 2017-06-10 NOTE — Telephone Encounter (Signed)
Call pt since his bisoprolol is not available at this time his options are Bystolic 5mg  once a day  Or metoprolol 25mg XR once a day

## 2017-06-11 ENCOUNTER — Telehealth: Payer: Self-pay | Admitting: *Deleted

## 2017-06-11 DIAGNOSIS — I493 Ventricular premature depolarization: Secondary | ICD-10-CM

## 2017-06-11 MED ORDER — METOPROLOL SUCCINATE ER 25 MG PO TB24: 25.0000 mg | ORAL_TABLET | Freq: Every day | ORAL | 3 refills | Status: DC

## 2017-06-11 MED ORDER — LANCET DEVICES MISC: 1 refills | Status: DC

## 2017-06-11 NOTE — Telephone Encounter (Signed)
Received call from patient.   Requested referral to cards for frequent PVC's.   Ok to order?

## 2017-06-11 NOTE — Telephone Encounter (Signed)
Okay to place? 

## 2017-06-11 NOTE — Telephone Encounter (Signed)
Call placed to patient and patient made aware.   Agreed to change to Toprol XL. Prescription sent to pharmacy.

## 2017-06-19 ENCOUNTER — Encounter: Payer: Self-pay | Admitting: Family Medicine

## 2017-06-19 NOTE — Telephone Encounter (Signed)
Call placed to patient.   Advised that Bisoprlol is on backorder and pharmacy cannot get in stock.  Advised that he can try Bystolic as alternative if SE are too much.   States that he will give it a few more days before he decides.

## 2017-07-04 DIAGNOSIS — G4733 Obstructive sleep apnea (adult) (pediatric): Secondary | ICD-10-CM | POA: Diagnosis not present

## 2017-07-13 ENCOUNTER — Encounter: Payer: Self-pay | Admitting: *Deleted

## 2017-07-14 DIAGNOSIS — J029 Acute pharyngitis, unspecified: Secondary | ICD-10-CM | POA: Diagnosis not present

## 2017-07-16 ENCOUNTER — Encounter: Payer: Self-pay | Admitting: Family Medicine

## 2017-07-27 ENCOUNTER — Ambulatory Visit: Payer: 59 | Admitting: Internal Medicine

## 2017-07-27 ENCOUNTER — Encounter: Payer: Self-pay | Admitting: Internal Medicine

## 2017-07-27 VITALS — BP 138/86 | HR 78 | Ht 73.0 in | Wt 301.1 lb

## 2017-07-27 DIAGNOSIS — R002 Palpitations: Secondary | ICD-10-CM

## 2017-07-27 DIAGNOSIS — G4733 Obstructive sleep apnea (adult) (pediatric): Secondary | ICD-10-CM

## 2017-07-27 DIAGNOSIS — I493 Ventricular premature depolarization: Secondary | ICD-10-CM

## 2017-07-27 DIAGNOSIS — E782 Mixed hyperlipidemia: Secondary | ICD-10-CM | POA: Diagnosis not present

## 2017-07-27 LAB — LIPID PANEL
Chol/HDL Ratio: 5.4 ratio — ABNORMAL HIGH (ref 0.0–5.0)
Cholesterol, Total: 204 mg/dL — ABNORMAL HIGH (ref 100–199)
HDL: 38 mg/dL — ABNORMAL LOW (ref >39)
LDL Calculated: 90 mg/dL (ref 0–99)
Triglycerides: 379 mg/dL — ABNORMAL HIGH (ref 0–149)
VLDL Cholesterol Cal: 76 mg/dL — ABNORMAL HIGH (ref 5–40)

## 2017-07-27 NOTE — Patient Instructions (Signed)
Your physician recommends that you continue on your current medications as directed. Please refer to the Current Medication list given to you today.  Your physician recommends that you return for lab work today ([place])  Your physician has recommended that you wear a holter monitor. Holter monitors are medical devices that record the heart's electrical activity. Doctors most often use these monitors to diagnose arrhythmias. Arrhythmias are problems with the speed or rhythm of the heartbeat. The monitor is a small, portable device. You can wear one while you do your normal daily activities. This is usually used to diagnose what is causing palpitations/syncope (passing out).  Follow up with your physician will depend on test results.
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
## 2017-07-28 ENCOUNTER — Ambulatory Visit (INDEPENDENT_AMBULATORY_CARE_PROVIDER_SITE_OTHER): Payer: 59

## 2017-07-28 DIAGNOSIS — I493 Ventricular premature depolarization: Secondary | ICD-10-CM

## 2017-07-31 ENCOUNTER — Other Ambulatory Visit: Payer: Self-pay | Admitting: *Deleted

## 2017-07-31 DIAGNOSIS — E782 Mixed hyperlipidemia: Secondary | ICD-10-CM

## 2017-08-03 ENCOUNTER — Encounter: Payer: Self-pay | Admitting: Internal Medicine

## 2017-08-04 DIAGNOSIS — G4733 Obstructive sleep apnea (adult) (pediatric): Secondary | ICD-10-CM | POA: Diagnosis not present

## 2017-08-05 MED ORDER — METOPROLOL SUCCINATE ER 25 MG PO TB24: 25.0000 mg | ORAL_TABLET | Freq: Two times a day (BID) | ORAL | Status: DC

## 2017-08-05 NOTE — Telephone Encounter (Signed)
Notes recorded by Rodman Key, RN on 08/05/2017 at 3:56 PM EST Reviewed with patient. He will try Toprol XL 25 mg two times a day to see if helps. He sensed the skips, did not have the diary paper available to record. Pt will send MyChart message on how the twice a day dose is working and at that time we can send a prescription for new dose. Medication updated in MyChart.    Notes recorded by Fay Records, MD on 08/03/2017 at 9:03 AM EST Rare skips noted (no diary entries for any symptoms) Could try increasing toprol XL to 2x per day to see if helps

## 2017-08-21 ENCOUNTER — Other Ambulatory Visit: Payer: Self-pay

## 2017-08-21 ENCOUNTER — Ambulatory Visit (INDEPENDENT_AMBULATORY_CARE_PROVIDER_SITE_OTHER): Payer: 59 | Admitting: Family Medicine

## 2017-08-21 ENCOUNTER — Encounter: Payer: Self-pay | Admitting: Family Medicine

## 2017-08-21 VITALS — BP 132/70 | HR 72 | Temp 99.8°F | Resp 16 | Ht 73.0 in | Wt 301.0 lb

## 2017-08-21 DIAGNOSIS — B349 Viral infection, unspecified: Secondary | ICD-10-CM | POA: Diagnosis not present

## 2017-08-21 DIAGNOSIS — E119 Type 2 diabetes mellitus without complications: Secondary | ICD-10-CM

## 2017-08-21 DIAGNOSIS — R6889 Other general symptoms and signs: Secondary | ICD-10-CM

## 2017-08-21 LAB — INFLUENZA A AND B AG, IMMUNOASSAY
INFLUENZA A ANTIGEN: NOT DETECTED
INFLUENZA B ANTIGEN: NOT DETECTED

## 2017-08-21 MED ORDER — ATORVASTATIN CALCIUM 80 MG PO TABS: 80.0000 mg | ORAL_TABLET | Freq: Every day | ORAL | 1 refills | Status: DC

## 2017-08-21 MED ORDER — METOPROLOL SUCCINATE ER 25 MG PO TB24: 25.0000 mg | ORAL_TABLET | Freq: Two times a day (BID) | ORAL | 1 refills | Status: DC

## 2017-08-21 MED ORDER — OSELTAMIVIR PHOSPHATE 75 MG PO CAPS: 75.0000 mg | ORAL_CAPSULE | Freq: Two times a day (BID) | ORAL | 0 refills | Status: DC

## 2017-08-21 NOTE — Addendum Note (Signed)
Addended by: Vic Blackbird F on: 08/21/2017 11:04 AM   Modules accepted: Orders

## 2017-08-21 NOTE — Progress Notes (Signed)
   Subjective:    Patient ID: Howard Hays, male    DOB: 1976-03-31, 42 y.o.   MRN: [id number]  Patient presents for Illness (x1 days- fever/ chills/ night sweats- has taken Emergent C)   Pt here with fever, chills, night sweats started yesterday. He returned from business trip, but daughter and wife have the flu. No cough, feels a little achy today. Took OTC vitamin C   DM- last A1C 6.2%. Taking Jardiance. Also on Lipitor, was not expecting diabetes check, did not bring meter, states blood sugars have been okay?    Review Of Systems:  GEN- denies fatigue, fever,+ weight loss, denies weakness, recent illness HEENT- denies eye drainage, change in vision, nasal discharge, CVS- denies chest pain, palpitations RESP- denies SOB, cough, wheeze ABD- denies N/V, change in stools, abd pain GU- denies dysuria, hematuria, dribbling, incontinence MSK- denies joint pain, muscle aches, injury Neuro- denies headache, dizziness, syncope, seizure activity       Objective:    BP 132/70   Pulse 72   Temp 99.8 F (37.7 C) (Oral)   Resp 16   Ht 6\' 1"  (1.854 m)   Wt (!) 301 lb (136.5 kg)   SpO2 96%   BMI 39.71 kg/m  GEN- NAD, alert and oriented x3 HEENT- PERRL, EOMI, non injected sclera, pink conjunctiva, MMM, oropharynx clear, TM clear bilat no effusion, nares clear  Neck- Supple, no LAD  CVS- RRR, no murmur RESP-CTAB Pulses- Radial  2+        Assessment & Plan:      Problem List Items Addressed This Visit      Unprioritized   Diabetes mellitus, type II (HCC)    Check A1C goal < 7% on Jardiance      Relevant Orders   Hemoglobin B4W   Basic metabolic panel    Other Visit Diagnoses    Flu-like symptoms    -  Primary   Early flu like symptoms, positive exposure with his children and wife, start Tamiflu.   Relevant Orders   Influenza A and B Ag, Immunoassay (Completed)   Viral illness       Relevant Medications   oseltamivir (TAMIFLU) 75 MG capsule      Note: This  dictation was prepared with Dragon dictation along with smaller phrase technology. Any transcriptional errors that result from this process are unintentional.

## 2017-08-21 NOTE — Patient Instructions (Addendum)
Take the tamiflu Push fluids We will call with lab results F/U 4 months PHYSICAL

## 2017-08-21 NOTE — Assessment & Plan Note (Signed)
Check A1C goal < 7% on Jardiance

## 2017-08-22 LAB — BASIC METABOLIC PANEL
BUN: 17 mg/dL (ref 7–25)
CO2: 26 mmol/L (ref 20–32)
Calcium: 9.2 mg/dL (ref 8.6–10.3)
Chloride: 106 mmol/L (ref 98–110)
Creat: 1.23 mg/dL (ref 0.60–1.35)
Glucose, Bld: 105 mg/dL — ABNORMAL HIGH (ref 65–99)
Potassium: 4.2 mmol/L (ref 3.5–5.3)
Sodium: 140 mmol/L (ref 135–146)

## 2017-08-22 LAB — HEMOGLOBIN A1C
Hgb A1c MFr Bld: 6.1 % of total Hgb — ABNORMAL HIGH (ref <5.7)
Mean Plasma Glucose: 128 (calc)
eAG (mmol/L): 7.1 (calc)

## 2017-08-28 ENCOUNTER — Other Ambulatory Visit: Payer: Self-pay | Admitting: *Deleted

## 2017-08-28 MED ORDER — EMPAGLIFLOZIN 25 MG PO TABS: 25.0000 mg | ORAL_TABLET | Freq: Every day | ORAL | 0 refills | Status: DC

## 2017-09-01 DIAGNOSIS — G4733 Obstructive sleep apnea (adult) (pediatric): Secondary | ICD-10-CM | POA: Diagnosis not present

## 2017-09-17 ENCOUNTER — Ambulatory Visit: Payer: 59 | Admitting: Physician Assistant

## 2017-09-21 ENCOUNTER — Encounter: Payer: Self-pay | Admitting: Family Medicine

## 2017-09-21 ENCOUNTER — Encounter: Payer: Self-pay | Admitting: Internal Medicine

## 2017-09-24 ENCOUNTER — Telehealth: Payer: Self-pay | Admitting: *Deleted

## 2017-09-24 NOTE — Telephone Encounter (Signed)
Received fax requesting alternative to Metoprolol Succinate 25mg  PO BID.   Reports that insurance will only cover (1) tab PO QD.   MD please advise.

## 2017-09-25 ENCOUNTER — Ambulatory Visit (INDEPENDENT_AMBULATORY_CARE_PROVIDER_SITE_OTHER): Payer: BLUE CROSS/BLUE SHIELD | Admitting: Family Medicine

## 2017-09-25 ENCOUNTER — Other Ambulatory Visit: Payer: Self-pay

## 2017-09-25 ENCOUNTER — Encounter: Payer: Self-pay | Admitting: Family Medicine

## 2017-09-25 VITALS — BP 130/82 | HR 83 | Temp 98.7°F | Resp 16 | Ht 73.0 in | Wt 303.8 lb

## 2017-09-25 DIAGNOSIS — M25561 Pain in right knee: Secondary | ICD-10-CM

## 2017-09-25 DIAGNOSIS — I493 Ventricular premature depolarization: Secondary | ICD-10-CM

## 2017-09-25 MED ORDER — METOPROLOL TARTRATE 25 MG PO TABS: 25.0000 mg | ORAL_TABLET | Freq: Two times a day (BID) | ORAL | 3 refills | Status: DC

## 2017-09-25 MED ORDER — EMPAGLIFLOZIN 25 MG PO TABS: 25.0000 mg | ORAL_TABLET | Freq: Every day | ORAL | 2 refills | Status: DC

## 2017-09-25 MED ORDER — NAPROXEN 500 MG PO TABS: 500.0000 mg | ORAL_TABLET | Freq: Two times a day (BID) | ORAL | 1 refills | Status: DC

## 2017-09-25 NOTE — Patient Instructions (Addendum)
Take naprosyn as needed F/U 3 months Physical (July)

## 2017-09-25 NOTE — Telephone Encounter (Signed)
See OV  

## 2017-09-25 NOTE — Progress Notes (Signed)
   Subjective:    Patient ID: Howard Hays, male    DOB: January 24, 1976, 42 y.o.   MRN: [id number]  Patient presents for Knee Pain (right knee)   Pt here with recurrent knee pain. 2 weeks ago Sunday arose from a chair and went to turn and knee popped. Pain has worsened over the past week. Had to leave work due to severe pain in Knee  Pain with walking/stairs A few days into pain noticed some mild swelling   Pain has improved past couple of days Took Naprosyn and tylenol    His metoprolol needs to be changed to tartrate he does take twice a day feels his dyspnea is better for him.  He was taking the extended release.  He sees have improved  Review Of Systems:  GEN- denies fatigue, fever, weight loss,weakness, recent illness HEENT- denies eye drainage, change in vision, nasal discharge, CVS- denies chest pain, palpitations RESP- denies SOB, cough, wheeze ABD- denies N/V, change in stools, abd pain GU- denies dysuria, hematuria, dribbling, incontinence MSK+joint pain, muscle aches, injury Neuro- denies headache, dizziness, syncope, seizure activity       Objective:    BP 130/82   Pulse 83   Temp 98.7 F (37.1 C) (Oral)   Resp 16   Ht 6\' 1"  (1.854 m)   Wt (!) 303 lb 12.8 oz (137.8 kg)   SpO2 97%   BMI 40.08 kg/m  GEN- NAD, alert and oriented x3 CVS- RRR, no murmur RESP-CTAB MSK- FROM bilat knee, normal inspection, more prominent tibial tuberosity right knee compared to left, mild crepitus right side, no effusion, ligaments grossly in tact bilat  EXT- No edema Pulses- Radial 2+        Assessment & Plan:      Problem List Items Addressed This Visit      Unprioritized   Frequent PVCs - Primary    Toprol was changed to metoprolol tartrate 25 mg twice a day      Relevant Medications   metoprolol tartrate (LOPRESSOR) 25 MG tablet    Other Visit Diagnoses    Acute pain of right knee       He has some DJD in his knee possible injury to the meniscus with a pop with  his turn.  But his pain is now resolved he does not have any effusion.  He can use Naprosyn as needed.  We discussed our options of going to orthopedics now or he can wait he has had a few flares of the past 2 years.  Secondary to some insurance changes he wants to hold off for now.  If he does have other issues with his right knee we will go ahead and send him directly to orthopedics and have imaging done with them.      Note: This dictation was prepared with Dragon dictation along with smaller phrase technology. Any transcriptional errors that result from this process are unintentional.

## 2017-09-25 NOTE — Assessment & Plan Note (Signed)
Toprol was changed to metoprolol tartrate 25 mg twice a day

## 2017-10-16 ENCOUNTER — Telehealth: Payer: Self-pay | Admitting: *Deleted

## 2017-10-16 MED ORDER — AMOXICILLIN 500 MG PO CAPS: 500.0000 mg | ORAL_CAPSULE | Freq: Two times a day (BID) | ORAL | 0 refills | Status: AC

## 2017-10-16 NOTE — Telephone Encounter (Signed)
Patient daughter seen in office with + strep.   NO given by MD for Amoxicillin 500mg  PO BID x7 days.   Prescription sent to pharmacy.

## 2017-10-19 ENCOUNTER — Encounter: Payer: Self-pay | Admitting: Family Medicine

## 2017-12-01 ENCOUNTER — Encounter: Payer: Self-pay | Admitting: Family Medicine

## 2017-12-05 ENCOUNTER — Other Ambulatory Visit: Payer: Self-pay | Admitting: Family Medicine

## 2017-12-07 ENCOUNTER — Other Ambulatory Visit: Payer: Self-pay

## 2018-01-12 ENCOUNTER — Encounter: Payer: Self-pay | Admitting: Family Medicine

## 2018-01-20 ENCOUNTER — Ambulatory Visit: Payer: BLUE CROSS/BLUE SHIELD | Admitting: Physician Assistant

## 2018-01-21 ENCOUNTER — Ambulatory Visit: Payer: BLUE CROSS/BLUE SHIELD | Admitting: Physician Assistant

## 2018-01-27 ENCOUNTER — Ambulatory Visit: Payer: BLUE CROSS/BLUE SHIELD | Admitting: Family Medicine

## 2018-02-01 ENCOUNTER — Ambulatory Visit (INDEPENDENT_AMBULATORY_CARE_PROVIDER_SITE_OTHER): Payer: BLUE CROSS/BLUE SHIELD | Admitting: Family Medicine

## 2018-02-01 ENCOUNTER — Other Ambulatory Visit: Payer: Self-pay

## 2018-02-01 ENCOUNTER — Encounter: Payer: Self-pay | Admitting: Family Medicine

## 2018-02-01 VITALS — BP 134/78 | HR 68 | Temp 98.1°F | Resp 14 | Ht 73.0 in | Wt 303.0 lb

## 2018-02-01 DIAGNOSIS — K219 Gastro-esophageal reflux disease without esophagitis: Secondary | ICD-10-CM

## 2018-02-01 DIAGNOSIS — E119 Type 2 diabetes mellitus without complications: Secondary | ICD-10-CM | POA: Diagnosis not present

## 2018-02-01 DIAGNOSIS — R0982 Postnasal drip: Secondary | ICD-10-CM | POA: Diagnosis not present

## 2018-02-01 DIAGNOSIS — E782 Mixed hyperlipidemia: Secondary | ICD-10-CM

## 2018-02-01 NOTE — Assessment & Plan Note (Signed)
Recheck A1C and lipids, he last ate breakfast Continue oral meds Discussed his obesity, dietary changes needed - lower carbs and sugars

## 2018-02-01 NOTE — Patient Instructions (Signed)
Take the nexium for 2 weeks  Get the allergy spray nasacort/flonase  F/U 4 months

## 2018-02-01 NOTE — Assessment & Plan Note (Signed)
Trial of nexium 20mg  given from office for 2 weeks Also will use nasal steroid for post nasal drip Both are contributing to the sensation he feels in his throat.

## 2018-02-01 NOTE — Progress Notes (Signed)
   Subjective:    Patient ID: [name], male    DOB: 1976/06/09, 42 y.o.   MRN: [id number]  Patient presents for Throat Issues (intermittent feeling of tightness to throat- states that pressure can be relieved with burping sometimes)   Starting middle of July had a sensation in throat that feels tight, he is able to swallow and breathe. No food getting stuck  He took allergy pill helped some   He does get some post nasal drainage which makes him clear his throat    Symptoms are intermittant occ feels a lump   He does belch some which helps,. Has history of GERD,  but it doesn't go away completely   He does smoke , typically when he drinks    DM- last A2C 6.1%, highest 129 , last lipid panel, LDL 90, TG 379 in Feb    Review Of Systems:  GEN- denies fatigue, fever, weight loss,weakness, recent illness HEENT- denies eye drainage, change in vision, nasal discharge, CVS- denies chest pain, palpitations RESP- denies SOB, cough, wheeze ABD- denies N/V, change in stools, abd pain GU- denies dysuria, hematuria, dribbling, incontinence MSK- denies joint pain, muscle aches, injury Neuro- denies headache, dizziness, syncope, seizure activity       Objective:    BP 134/78   Pulse 68   Temp 98.1 F (36.7 C) (Oral)   Resp 14   Ht 6\' 1"  (1.854 m)   Wt (!) 303 lb (137.4 kg)   SpO2 97%   BMI 39.98 kg/m  GEN- NAD, alert and oriented x3 HEENT- PERRL, EOMI, non injected sclera, pink conjunctiva, MMM, oropharynx clear,nares clear rhinorrhea, no maxillary sinus Neck- Supple, no thyromegaly, no LAD  CVS- RRR, no murmur RESP-CTAB EXT- No edema Pulses- Radia  2+        Assessment & Plan:      Problem List Items Addressed This Visit      Unprioritized   Diabetes mellitus, type II ([place])    Recheck A1C and lipids, he last ate breakfast Continue oral meds Discussed his obesity, dietary changes needed - lower carbs and sugars      Relevant Orders   CBC with  Differential/Platelet   Comprehensive metabolic panel   Hemoglobin A1c   GERD - Primary    Trial of nexium 20mg  given from office for 2 weeks Also will use nasal steroid for post nasal drip Both are contributing to the sensation he feels in his throat.      Hyperlipidemia   Relevant Orders   Lipid panel    Other Visit Diagnoses    Post-nasal drip          Note: This dictation was prepared with Dragon dictation along with smaller phrase technology. Any transcriptional errors that result from this process are unintentional.

## 2018-02-02 LAB — HEMOGLOBIN A1C
Hgb A1c MFr Bld: 6.1 % of total Hgb — ABNORMAL HIGH (ref <5.7)
Mean Plasma Glucose: 128 (calc)
eAG (mmol/L): 7.1 (calc)

## 2018-02-02 LAB — COMPREHENSIVE METABOLIC PANEL
AG Ratio: 1.4 (calc) (ref 1.0–2.5)
ALT: 27 U/L (ref 9–46)
AST: 19 U/L (ref 10–40)
Albumin: 4 g/dL (ref 3.6–5.1)
Alkaline phosphatase (APISO): 90 U/L (ref 40–115)
BUN: 17 mg/dL (ref 7–25)
CO2: 26 mmol/L (ref 20–32)
Calcium: 9 mg/dL (ref 8.6–10.3)
Chloride: 102 mmol/L (ref 98–110)
Creat: 1.27 mg/dL (ref 0.60–1.35)
Globulin: 2.9 g/dL (calc) (ref 1.9–3.7)
Glucose, Bld: 93 mg/dL (ref 65–99)
Potassium: 4 mmol/L (ref 3.5–5.3)
Sodium: 138 mmol/L (ref 135–146)
Total Bilirubin: 0.4 mg/dL (ref 0.2–1.2)
Total Protein: 6.9 g/dL (ref 6.1–8.1)

## 2018-02-02 LAB — CBC WITH DIFFERENTIAL/PLATELET
Basophils Absolute: 29 cells/uL (ref 0–200)
Basophils Relative: 0.4 %
Eosinophils Absolute: 79 cells/uL (ref 15–500)
Eosinophils Relative: 1.1 %
HCT: 41.2 % (ref 38.5–50.0)
Hemoglobin: 13.9 g/dL (ref 13.2–17.1)
Lymphs Abs: 1750 cells/uL (ref 850–3900)
MCH: 28.4 pg (ref 27.0–33.0)
MCHC: 33.7 g/dL (ref 32.0–36.0)
MCV: 84.1 fL (ref 80.0–100.0)
MPV: 10.7 fL (ref 7.5–12.5)
Monocytes Relative: 9.8 %
Neutro Abs: 4637 cells/uL (ref 1500–7800)
Neutrophils Relative %: 64.4 %
Platelets: 245 10*3/uL (ref 140–400)
RBC: 4.9 10*6/uL (ref 4.20–5.80)
RDW: 14.3 % (ref 11.0–15.0)
Total Lymphocyte: 24.3 %
WBC mixed population: 706 cells/uL (ref 200–950)
WBC: 7.2 10*3/uL (ref 3.8–10.8)

## 2018-02-02 LAB — LIPID PANEL
Cholesterol: 172 mg/dL (ref <200)
HDL: 37 mg/dL — ABNORMAL LOW (ref >40)
LDL Cholesterol (Calc): 94 mg/dL (calc)
Non-HDL Cholesterol (Calc): 135 mg/dL (calc) — ABNORMAL HIGH (ref <130)
Total CHOL/HDL Ratio: 4.6 (calc) (ref <5.0)
Triglycerides: 284 mg/dL — ABNORMAL HIGH (ref <150)

## 2018-02-03 ENCOUNTER — Encounter: Payer: Self-pay | Admitting: *Deleted

## 2018-04-05 ENCOUNTER — Ambulatory Visit (INDEPENDENT_AMBULATORY_CARE_PROVIDER_SITE_OTHER): Payer: BLUE CROSS/BLUE SHIELD | Admitting: *Deleted

## 2018-04-05 DIAGNOSIS — Z23 Encounter for immunization: Secondary | ICD-10-CM | POA: Diagnosis not present

## 2018-04-07 ENCOUNTER — Ambulatory Visit: Payer: 59 | Admitting: Adult Health

## 2018-04-09 ENCOUNTER — Ambulatory Visit: Payer: Self-pay | Admitting: Adult Health

## 2018-04-27 ENCOUNTER — Encounter: Payer: Self-pay | Admitting: Adult Health

## 2018-04-27 ENCOUNTER — Ambulatory Visit (INDEPENDENT_AMBULATORY_CARE_PROVIDER_SITE_OTHER): Payer: BLUE CROSS/BLUE SHIELD | Admitting: Adult Health

## 2018-04-27 DIAGNOSIS — G4733 Obstructive sleep apnea (adult) (pediatric): Secondary | ICD-10-CM | POA: Diagnosis not present

## 2018-04-27 DIAGNOSIS — Z6841 Body Mass Index (BMI) 40.0 and over, adult: Secondary | ICD-10-CM

## 2018-04-27 NOTE — Assessment & Plan Note (Signed)
Wt loss  

## 2018-04-27 NOTE — Addendum Note (Signed)
Addended by: Parke Poisson E on: 04/27/2018 05:22 PM   Modules accepted: Orders

## 2018-04-27 NOTE — Patient Instructions (Signed)
Continue on C Pap at bedtime Keep up good work.  Work on healthy weight. Do not drive if sleepy Follow-up in one year with Dr. Halford Chessman or Gerre Ranum NP and As needed
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
## 2018-04-27 NOTE — Assessment & Plan Note (Signed)
Excellent control on CPAP  Plan  Patient Instructions  Continue on C Pap at bedtime Keep up good work.  Work on healthy weight. Do not drive if sleepy Follow-up in one year with Dr. Halford Chessman or Lizzet Hendley NP and As needed

## 2018-04-28 NOTE — Progress Notes (Signed)
Reviewed and agree with assessment/plan.   Chani Ghanem, MD [facility] 06/18/2016, 12:24 PM Pager:  [phone]  

## 2018-04-29 ENCOUNTER — Other Ambulatory Visit: Payer: Self-pay | Admitting: Family Medicine

## 2018-04-29 ENCOUNTER — Encounter: Payer: Self-pay | Admitting: Family Medicine

## 2018-04-30 NOTE — Telephone Encounter (Signed)
He needs a f/u for 1st week of December I would not recommend taking MTF based on all of his symptoms before. Okay to give Jardiance samples if cost is an issue until his OV and we can discuss in person Xanax refilled uses prn

## 2018-04-30 NOTE — Telephone Encounter (Signed)
Patient made aware via MyChart. ?

## 2018-04-30 NOTE — Telephone Encounter (Signed)
Ok to refill??  Last office visit 02/01/2018.  Last refill 05/21/2017, #2 refills.   Of note, patient did request Metformin refill. Denied refill as medication was stopped in October 2018 due to increased GI upset. Patient has sent message in regards to [facility]. Will follow up in that encounter.

## 2018-05-08 ENCOUNTER — Other Ambulatory Visit: Payer: Self-pay | Admitting: *Deleted

## 2018-05-12 ENCOUNTER — Ambulatory Visit (INDEPENDENT_AMBULATORY_CARE_PROVIDER_SITE_OTHER): Payer: BLUE CROSS/BLUE SHIELD | Admitting: Family Medicine

## 2018-05-12 ENCOUNTER — Encounter: Payer: Self-pay | Admitting: Family Medicine

## 2018-05-12 ENCOUNTER — Other Ambulatory Visit: Payer: Self-pay

## 2018-05-12 VITALS — BP 128/78 | HR 82 | Temp 98.1°F | Resp 14 | Ht 73.0 in | Wt 308.6 lb

## 2018-05-12 DIAGNOSIS — I493 Ventricular premature depolarization: Secondary | ICD-10-CM

## 2018-05-12 DIAGNOSIS — E782 Mixed hyperlipidemia: Secondary | ICD-10-CM | POA: Diagnosis not present

## 2018-05-12 DIAGNOSIS — E119 Type 2 diabetes mellitus without complications: Secondary | ICD-10-CM | POA: Diagnosis not present

## 2018-05-12 DIAGNOSIS — Z6841 Body Mass Index (BMI) 40.0 and over, adult: Secondary | ICD-10-CM

## 2018-05-12 NOTE — Assessment & Plan Note (Addendum)
Will recheck A1C, with his hyperlipidemia and morbid obesity will benefit from treatment and getting A1C < 6% Discussed trying low dose Metformin again ( despite having diarrhea before he wants to this) vs another med such as glipizide ( prefer not due to his obesity) or [place].   He is to schedule an eye exam

## 2018-05-12 NOTE — Assessment & Plan Note (Signed)
He has been taking metoprolol once a day, no PVC continue 25mg  daily

## 2018-05-12 NOTE — Assessment & Plan Note (Signed)
Discussed dietary changes, needs significant weight loss 50lbs or more Increased risk for heart disease

## 2018-05-12 NOTE — Assessment & Plan Note (Signed)
Continue lipitor  ?

## 2018-05-12 NOTE — Patient Instructions (Addendum)
F/U 4 MONTHS  Schedule your eye exam

## 2018-05-12 NOTE — Progress Notes (Signed)
   Subjective:    Patient ID: Howard Hays, male    DOB: 1976/06/17, 42 y.o.   MRN: [id number]  Patient presents for Medication Discussion (DM)   Pt here to f/u diabetes mellitus, last A1C 6.1% in August, his TG haed improved to  284 in august as well, LDL was  94   Weight up about 5lbs since August admits to eating out a lot past couple of months, not making healthy decisions Currently on lipitor 80mg . He was last on Jardiance ,. But he stopped  He had penile itching, for a few months, saw Side effects and stopped jardiance on 11/7, symptoms resolved CBG- not checking regulary typically around 99-120  He has also tried: Metformin- had diarrhea which was bothersome Saxenda- had belching issues, constipation, nausea  PVC's taking Metoprolol once a day since April   Review Of Systems:  GEN- denies fatigue, fever, weight loss,weakness, recent illness HEENT- denies eye drainage, change in vision, nasal discharge, CVS- denies chest pain, palpitations RESP- denies SOB, cough, wheeze ABD- denies N/V, change in stools, abd pain GU- denies dysuria, hematuria, dribbling, incontinence MSK- denies joint pain, muscle aches, injury Neuro- denies headache, dizziness, syncope, seizure activity       Objective:    BP 128/78   Pulse 82   Temp 98.1 F (36.7 C) (Oral)   Resp 14   Ht 6\' 1"  (1.854 m)   Wt (!) 308 lb 9.6 oz (140 kg)   SpO2 94%   BMI 40.71 kg/m  GEN- NAD, alert and oriented x3,obese HEENT- PERRL, EOMI, non injected sclera, pink conjunctiva, MMM, oropharynx clear Neck- Supple, no thyromegaly CVS- RRR, no murmur RESP-CTAB EXT- No edema Pulses- Radial, DP- 2+        Assessment & Plan:    Unable to get labs drawn, pt to hydrate return in the morning   Problem List Items Addressed This Visit      Unprioritized   Diabetes mellitus, type II ([place]) - Primary    Will recheck A1C, with his hyperlipidemia and morbid obesity will benefit from treatment and getting A1C <  6% Discussed trying low dose Metformin again ( despite having diarrhea before he wants to this) vs another med such as glipizide ( prefer not due to his obesity) or [place].   He is to schedule an eye exam       Relevant Orders   Hemoglobin A1c   Comprehensive metabolic panel   Microalbumin / creatinine urine ratio (Completed)   HM DIABETES FOOT EXAM (Completed)   Frequent PVCs    He has been taking metoprolol once a day, no PVC continue 25mg  daily      Hyperlipidemia    Continue lipitor       Obesity    Discussed dietary changes, needs significant weight loss 50lbs or more Increased risk for heart disease         Note: This dictation was prepared with Dragon dictation along with smaller phrase technology. Any transcriptional errors that result from this process are unintentional.

## 2018-05-13 ENCOUNTER — Encounter: Payer: Self-pay | Admitting: Family Medicine

## 2018-05-13 LAB — COMPREHENSIVE METABOLIC PANEL

## 2018-05-13 LAB — MICROALBUMIN / CREATININE URINE RATIO
Creatinine, Urine: 254 mg/dL (ref 20–320)
Microalb Creat Ratio: 3 mcg/mg creat (ref <30)
Microalb, Ur: 0.7 mg/dL

## 2018-05-13 LAB — HEMOGLOBIN A1C

## 2018-05-14 ENCOUNTER — Other Ambulatory Visit: Payer: BLUE CROSS/BLUE SHIELD

## 2018-05-14 ENCOUNTER — Other Ambulatory Visit: Payer: Self-pay | Admitting: Family Medicine

## 2018-05-14 DIAGNOSIS — E119 Type 2 diabetes mellitus without complications: Secondary | ICD-10-CM | POA: Diagnosis not present

## 2018-05-18 LAB — COMPREHENSIVE METABOLIC PANEL
AG Ratio: 1.4 (calc) (ref 1.0–2.5)
ALT: 35 U/L (ref 9–46)
AST: 23 U/L (ref 10–40)
Albumin: 4.2 g/dL (ref 3.6–5.1)
Alkaline phosphatase (APISO): 94 U/L (ref 40–115)
BUN: 14 mg/dL (ref 7–25)
CO2: 26 mmol/L (ref 20–32)
Calcium: 9.3 mg/dL (ref 8.6–10.3)
Chloride: 103 mmol/L (ref 98–110)
Creat: 1.26 mg/dL (ref 0.60–1.35)
Globulin: 3 g/dL (calc) (ref 1.9–3.7)
Glucose, Bld: 76 mg/dL (ref 65–99)
Potassium: 3.9 mmol/L (ref 3.5–5.3)
Sodium: 141 mmol/L (ref 135–146)
Total Bilirubin: 0.4 mg/dL (ref 0.2–1.2)
Total Protein: 7.2 g/dL (ref 6.1–8.1)

## 2018-05-18 LAB — MICROALBUMIN / CREATININE URINE RATIO

## 2018-05-18 LAB — HEMOGLOBIN A1C
Hgb A1c MFr Bld: 6.1 % of total Hgb — ABNORMAL HIGH (ref <5.7)
Mean Plasma Glucose: 128 (calc)
eAG (mmol/L): 7.1 (calc)

## 2018-05-19 ENCOUNTER — Encounter: Payer: Self-pay | Admitting: Family Medicine

## 2018-05-27 ENCOUNTER — Other Ambulatory Visit: Payer: Self-pay | Admitting: *Deleted

## 2018-05-27 MED ORDER — METFORMIN HCL 500 MG PO TABS: 500.0000 mg | ORAL_TABLET | Freq: Every day | ORAL | 1 refills | Status: DC

## 2018-07-20 ENCOUNTER — Ambulatory Visit (INDEPENDENT_AMBULATORY_CARE_PROVIDER_SITE_OTHER): Payer: BLUE CROSS/BLUE SHIELD | Admitting: Family Medicine

## 2018-07-20 ENCOUNTER — Other Ambulatory Visit: Payer: Self-pay

## 2018-07-20 ENCOUNTER — Encounter: Payer: Self-pay | Admitting: Family Medicine

## 2018-07-20 VITALS — BP 130/80 | HR 80 | Temp 98.5°F | Resp 16 | Ht 73.0 in | Wt 309.0 lb

## 2018-07-20 DIAGNOSIS — R21 Rash and other nonspecific skin eruption: Secondary | ICD-10-CM

## 2018-07-20 MED ORDER — METHYLPREDNISOLONE ACETATE 80 MG/ML IJ SUSP
80.0000 mg | Freq: Once | INTRAMUSCULAR | Status: AC
  Administered 2018-07-20: 80 mg via INTRAMUSCULAR

## 2018-07-20 MED ORDER — VALACYCLOVIR HCL 1 G PO TABS: 1000.0000 mg | ORAL_TABLET | Freq: Three times a day (TID) | ORAL | 0 refills | Status: DC

## 2018-07-20 MED ORDER — PREDNISONE 10 MG PO TABS: ORAL_TABLET | ORAL | 0 refills | Status: DC

## 2018-07-20 NOTE — Progress Notes (Signed)
   Subjective:    Patient ID: Howard Hays, male    DOB: [birth year], 43 y.o.   MRN: [id number]  Patient presents for Rash (x4 days- irritation and small red bumps to R hand/arm, axilla, and back- has tried calamine lotion and benadryl)  Pt  Here with ash started last Friday with redness and itching beneath right axilla, then progressed over weekend to small patches on  right forearm arm on both doral and ventral side, has small patch center of his right palm and upper back Used a new cleaner at work, that his forearm got into, but otherwise no new food, illness, medication, bug/tick bites      Review Of Systems:  GEN- denies fatigue, fever, weight loss,weakness, recent illness HEENT- denies eye drainage, change in vision, nasal discharge, CVS- denies chest pain, palpitations RESP- denies SOB, cough, wheeze ABD- denies N/V, change in stools, abd pain GU- denies dysuria, hematuria, dribbling, incontinence MSK- denies joint pain, muscle aches, injury Neuro- denies headache, dizziness, syncope, seizure activity       Objective:    BP 130/80   Pulse 80   Temp 98.5 F (36.9 C) (Oral)   Resp 16   Ht 6\' 1"  (1.854 m)   Wt (!) 309 lb (140.2 kg)   SpO2 96%   BMI 40.77 kg/m  GEN- NAD, alert and oriented x3 HEENT- PERRL, EOMI, non injected sclera, pink conjunctiva, MMM, oropharynx clear Neck- Supple, no LAD CVS- RRR, no murmur RESP-CTAB Skin- multiple patches of erythematous small blisters, beneath right axilla 2 patches right forearm and arm, some scattered upper arm, large patch on upper back, small quarter size patch on palm of right hand, 1 small red macular lesion beneath left lower eyelid  EXT- No edema Pulses- Radial,2+        Assessment & Plan:      Problem List Items Addressed This Visit    None    Visit Diagnoses    Rash and nonspecific skin eruption    -  Primary   Depo medrol 80mg  IM given, prednisone, taper, benadryl, note for workfor today as still  spreading, initially appearnce more shingles but multiple dermatomes involved although some are adjacent.  We will go ahead and treat him with Valtrex as well.  He does have 1 small red spot on the left side of his face but he also has some little angiomas that are coming up on his face.   Relevant Medications   methylPREDNISolone acetate (DEPO-MEDROL) injection 80 mg (Completed)      Note: This dictation was prepared with Dragon dictation along with smaller phrase technology. Any transcriptional errors that result from this process are unintentional.

## 2018-07-20 NOTE — Patient Instructions (Signed)
Take the prednisone starting tomorrow morning Use benadryl as needed  Call if not improved in 48 hours for recheck

## 2018-07-21 ENCOUNTER — Other Ambulatory Visit: Payer: Self-pay | Admitting: Family Medicine

## 2018-07-21 ENCOUNTER — Encounter: Payer: Self-pay | Admitting: Family Medicine

## 2018-07-21 MED ORDER — TRAMADOL HCL 50 MG PO TABS: 50.0000 mg | ORAL_TABLET | Freq: Four times a day (QID) | ORAL | 0 refills | Status: DC | PRN

## 2018-07-23 ENCOUNTER — Encounter: Payer: Self-pay | Admitting: Family Medicine

## 2018-07-23 ENCOUNTER — Ambulatory Visit (INDEPENDENT_AMBULATORY_CARE_PROVIDER_SITE_OTHER): Payer: BLUE CROSS/BLUE SHIELD | Admitting: Family Medicine

## 2018-07-23 ENCOUNTER — Other Ambulatory Visit: Payer: Self-pay

## 2018-07-23 VITALS — BP 130/84 | HR 84 | Temp 98.1°F | Resp 14 | Ht 73.0 in | Wt 309.0 lb

## 2018-07-23 DIAGNOSIS — R21 Rash and other nonspecific skin eruption: Secondary | ICD-10-CM

## 2018-07-23 DIAGNOSIS — B029 Zoster without complications: Secondary | ICD-10-CM

## 2018-07-23 NOTE — Patient Instructions (Addendum)
Complete the valtrex  Complete the steroids  We will call next week  F/U 2 months for diabetes

## 2018-07-23 NOTE — Progress Notes (Signed)
   Subjective:    Patient ID: Howard Hays, male    DOB: 1976-02-19, 43 y.o.   MRN: [id number]  Patient presents for Follow-up (rash)  Is here for interim follow-up on his rash.  He did not notice a mild red spot adjacent to the rash already had in the palm of his hand.  He also noted more definite blistering on his forearm lesions.  The one in his axilla has dried up some.  His upper back however is very pruritic.  He has been taking the tramadol as needed.  He is also on the Valtrex and the prednisone.  No other new spots nothing did occur with the red spot on his face.  He has not had any fever no nausea vomiting no joint pain.   Review Of Systems:  GEN- denies fatigue, fever, weight loss,weakness, recent illness HEENT- denies eye drainage, change in vision, nasal discharge, CVS- denies chest pain, palpitations RESP- denies SOB, cough, wheeze ABD- denies N/V, change in stools, abd pain GU- denies dysuria, hematuria, dribbling, incontinence MSK- denies joint pain, muscle aches, injury Neuro- denies headache, dizziness, syncope, seizure activity       Objective:    BP 130/84   Pulse 84   Temp 98.1 F (36.7 C) (Oral)   Resp 14   Ht 6\' 1"  (1.854 m)   Wt (!) 309 lb (140.2 kg)   SpO2 98%   BMI 40.77 kg/m  GEN- NAD, alert and oriented x3 HEENT- PERRL, EOMI, non injected sclera, pink conjunctiva, MMM, oropharynx clear Neck- Supple, no LAD Skin- multiple patches of erythematous small blisters, beneath right axilla - with scabs now,  2 patches right forearm and arm, some scattered upper arm, large patch on upper back with scabs in center , small quarter size patch on palm of right hand, very faint erythema ajdacent to patch on palm EXT- No edema Pulses- Radial,2+         Assessment & Plan:      Problem List Items Addressed This Visit    None    Visit Diagnoses    Herpes zoster without complication    -  Primary   Apperance today even more consistent with shingles  across adjaent dermatomes, complete valtrex, prednisone, ultram for pain, keep covered, no superinfection   Rash and nonspecific skin eruption          Note: This dictation was prepared with Dragon dictation along with smaller phrase technology. Any transcriptional errors that result from this process are unintentional.

## 2018-08-01 ENCOUNTER — Other Ambulatory Visit: Payer: Self-pay | Admitting: Family Medicine

## 2018-08-02 ENCOUNTER — Encounter: Payer: Self-pay | Admitting: Family Medicine

## 2018-08-04 ENCOUNTER — Ambulatory Visit: Payer: BLUE CROSS/BLUE SHIELD | Admitting: Family Medicine

## 2018-08-08 ENCOUNTER — Observation Stay (HOSPITAL_COMMUNITY)
Admission: EM | Admit: 2018-08-08 | Discharge: 2018-08-10 | Disposition: A | Discharge status: Home / Self Care | Payer: BLUE CROSS/BLUE SHIELD | Attending: Internal Medicine | Admitting: Internal Medicine

## 2018-08-08 ENCOUNTER — Encounter (HOSPITAL_COMMUNITY): Payer: Self-pay | Admitting: Emergency Medicine

## 2018-08-08 ENCOUNTER — Other Ambulatory Visit: Payer: Self-pay

## 2018-08-08 DIAGNOSIS — I200 Unstable angina: Secondary | ICD-10-CM

## 2018-08-08 DIAGNOSIS — Z87891 Personal history of nicotine dependence: Secondary | ICD-10-CM | POA: Diagnosis not present

## 2018-08-08 DIAGNOSIS — Z7982 Long term (current) use of aspirin: Secondary | ICD-10-CM | POA: Insufficient documentation

## 2018-08-08 DIAGNOSIS — Z8249 Family history of ischemic heart disease and other diseases of the circulatory system: Secondary | ICD-10-CM | POA: Insufficient documentation

## 2018-08-08 DIAGNOSIS — Q245 Malformation of coronary vessels: Secondary | ICD-10-CM | POA: Insufficient documentation

## 2018-08-08 DIAGNOSIS — E785 Hyperlipidemia, unspecified: Secondary | ICD-10-CM | POA: Diagnosis not present

## 2018-08-08 DIAGNOSIS — E119 Type 2 diabetes mellitus without complications: Secondary | ICD-10-CM

## 2018-08-08 DIAGNOSIS — M79602 Pain in left arm: Secondary | ICD-10-CM | POA: Diagnosis present

## 2018-08-08 DIAGNOSIS — F329 Major depressive disorder, single episode, unspecified: Secondary | ICD-10-CM | POA: Diagnosis not present

## 2018-08-08 DIAGNOSIS — E669 Obesity, unspecified: Secondary | ICD-10-CM | POA: Diagnosis present

## 2018-08-08 DIAGNOSIS — I25110 Atherosclerotic heart disease of native coronary artery with unstable angina pectoris: Secondary | ICD-10-CM | POA: Diagnosis not present

## 2018-08-08 DIAGNOSIS — F419 Anxiety disorder, unspecified: Secondary | ICD-10-CM | POA: Insufficient documentation

## 2018-08-08 DIAGNOSIS — I10 Essential (primary) hypertension: Secondary | ICD-10-CM | POA: Diagnosis not present

## 2018-08-08 DIAGNOSIS — K219 Gastro-esophageal reflux disease without esophagitis: Secondary | ICD-10-CM | POA: Insufficient documentation

## 2018-08-08 DIAGNOSIS — Z7984 Long term (current) use of oral hypoglycemic drugs: Secondary | ICD-10-CM | POA: Insufficient documentation

## 2018-08-08 DIAGNOSIS — Z79899 Other long term (current) drug therapy: Secondary | ICD-10-CM | POA: Insufficient documentation

## 2018-08-08 DIAGNOSIS — G4733 Obstructive sleep apnea (adult) (pediatric): Secondary | ICD-10-CM | POA: Diagnosis not present

## 2018-08-08 DIAGNOSIS — Z6841 Body Mass Index (BMI) 40.0 and over, adult: Secondary | ICD-10-CM | POA: Diagnosis not present

## 2018-08-08 HISTORY — DX: Type 2 diabetes mellitus with other specified complication: E11.69

## 2018-08-08 HISTORY — DX: Type 2 diabetes mellitus with other specified complication: E66.9

## 2018-08-08 LAB — CBC WITH DIFFERENTIAL/PLATELET
Abs Immature Granulocytes: 0.01 10*3/uL (ref 0.00–0.07)
Basophils Absolute: 0.1 10*3/uL (ref 0.0–0.1)
Basophils Relative: 1 %
Eosinophils Absolute: 0.1 10*3/uL (ref 0.0–0.5)
Eosinophils Relative: 1 %
HCT: 42.8 % (ref 39.0–52.0)
Hemoglobin: 13.8 g/dL (ref 13.0–17.0)
Immature Granulocytes: 0 %
Lymphocytes Relative: 22 %
Lymphs Abs: 2.1 10*3/uL (ref 0.7–4.0)
MCH: 28.4 pg (ref 26.0–34.0)
MCHC: 32.2 g/dL (ref 30.0–36.0)
MCV: 88.1 fL (ref 80.0–100.0)
Monocytes Absolute: 0.7 10*3/uL (ref 0.1–1.0)
Monocytes Relative: 8 %
Neutro Abs: 6.5 10*3/uL (ref 1.7–7.7)
Neutrophils Relative %: 68 %
Platelets: 269 10*3/uL (ref 150–400)
RBC: 4.86 MIL/uL (ref 4.22–5.81)
RDW: 14.3 % (ref 11.5–15.5)
WBC: 9.5 10*3/uL (ref 4.0–10.5)
nRBC: 0 % (ref 0.0–0.2)

## 2018-08-08 LAB — BASIC METABOLIC PANEL
Anion gap: 10 (ref 5–15)
BUN: 12 mg/dL (ref 6–20)
CO2: 24 mmol/L (ref 22–32)
Calcium: 9.2 mg/dL (ref 8.9–10.3)
Chloride: 104 mmol/L (ref 98–111)
Creatinine, Ser: 1.04 mg/dL (ref 0.61–1.24)
GFR calc Af Amer: >60 mL/min (ref >60)
GFR calc non Af Amer: >60 mL/min (ref >60)
Glucose, Bld: 153 mg/dL — ABNORMAL HIGH (ref 70–99)
Potassium: 3.7 mmol/L (ref 3.5–5.1)
Sodium: 138 mmol/L (ref 135–145)

## 2018-08-08 LAB — TROPONIN I
Troponin I: <0.03 ng/mL (ref <0.03)
Troponin I: <0.03 ng/mL (ref <0.03)
Troponin I: <0.03 ng/mL (ref <0.03)
Troponin I: <0.03 ng/mL (ref <0.03)

## 2018-08-08 LAB — GLUCOSE, CAPILLARY
Glucose-Capillary: 124 mg/dL — ABNORMAL HIGH (ref 70–99)
Glucose-Capillary: 103 mg/dL — ABNORMAL HIGH (ref 70–99)
Glucose-Capillary: 79 mg/dL (ref 70–99)
Glucose-Capillary: 142 mg/dL — ABNORMAL HIGH (ref 70–99)

## 2018-08-08 MED ORDER — ONDANSETRON HCL 4 MG/2ML IJ SOLN: 4.0000 mg | Freq: Four times a day (QID) | INTRAMUSCULAR | Status: DC | PRN

## 2018-08-08 MED ORDER — INSULIN ASPART 100 UNIT/ML ~~LOC~~ SOLN: 0.0000 [IU] | Freq: Three times a day (TID) | SUBCUTANEOUS | Status: DC

## 2018-08-08 MED ORDER — METOPROLOL TARTRATE 25 MG PO TABS
25.0000 mg | ORAL_TABLET | Freq: Two times a day (BID) | ORAL | Status: DC
  Administered 2018-08-08 – 2018-08-10 (×4): 25 mg via ORAL
  Filled 2018-08-08 (×5): qty 1

## 2018-08-08 MED ORDER — ENOXAPARIN SODIUM 40 MG/0.4ML ~~LOC~~ SOLN
40.0000 mg | SUBCUTANEOUS | Status: DC
  Administered 2018-08-08 – 2018-08-10 (×3): 40 mg via SUBCUTANEOUS
  Filled 2018-08-08 (×3): qty 0.4

## 2018-08-08 MED ORDER — MORPHINE SULFATE (PF) 2 MG/ML IV SOLN: 2.0000 mg | INTRAVENOUS | Status: DC | PRN

## 2018-08-08 MED ORDER — INSULIN ASPART 100 UNIT/ML ~~LOC~~ SOLN: 0.0000 [IU] | Freq: Every day | SUBCUTANEOUS | Status: DC

## 2018-08-08 MED ORDER — ASPIRIN EC 81 MG PO TBEC
81.0000 mg | DELAYED_RELEASE_TABLET | Freq: Every day | ORAL | Status: DC
  Administered 2018-08-09 – 2018-08-10 (×2): 81 mg via ORAL
  Filled 2018-08-08 (×3): qty 1

## 2018-08-08 MED ORDER — ATORVASTATIN CALCIUM 80 MG PO TABS
80.0000 mg | ORAL_TABLET | Freq: Every day | ORAL | Status: DC
  Administered 2018-08-08 – 2018-08-10 (×3): 80 mg via ORAL
  Filled 2018-08-08 (×3): qty 1

## 2018-08-08 MED ORDER — NITROGLYCERIN 0.4 MG SL SUBL
0.4000 mg | SUBLINGUAL_TABLET | Freq: Once | SUBLINGUAL | Status: AC
  Administered 2018-08-08: 0.4 mg via SUBLINGUAL
  Filled 2018-08-08: qty 1

## 2018-08-08 MED ORDER — ACETAMINOPHEN 325 MG PO TABS: 650.0000 mg | ORAL_TABLET | ORAL | Status: DC | PRN

## 2018-08-08 MED ORDER — TRAMADOL HCL 50 MG PO TABS: 50.0000 mg | ORAL_TABLET | Freq: Four times a day (QID) | ORAL | Status: DC | PRN

## 2018-08-08 MED ORDER — ALPRAZOLAM 0.5 MG PO TABS: 0.5000 mg | ORAL_TABLET | Freq: Two times a day (BID) | ORAL | Status: DC | PRN

## 2018-08-08 NOTE — Progress Notes (Signed)
Update provided to family  Informed RT of needing CPAP at bedtime
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
## 2018-08-08 NOTE — Progress Notes (Signed)
Pt family at bedside requesting MD update  Messaged MD to inform  Pt resting comfortably in bed, denies pain at this time  Will continue to monitor

## 2018-08-08 NOTE — Progress Notes (Signed)
NIV setup and ready for use at pt's bedside.  Pt states that he will not be ready for sleep until midnight.  Pt instructed to call when ready for CPAP.

## 2018-08-08 NOTE — ED Triage Notes (Signed)
C/o sudden onset of L arm pain while watching TV just prior to arrival.  Pain initially 8/10 and decreased to 2/10 after taking 3 baby ASA.  Denies chest pain, sob, nausea, vomiting or any other associated symptoms.  No known injury.

## 2018-08-08 NOTE — Progress Notes (Signed)
MD Yates aware and will be rounding shortly  Pt vitals completed, telemetry connected, oriented pt to room, call bell in reach. Pt denies pain at this time. Completed admission database at bedside, will continue to monitor

## 2018-08-08 NOTE — Progress Notes (Signed)
Pt did not eat breakfast tray this morning  Pt states he is hungry and would like additional dinner tray  Pt ordered grilled chicken, carrots and broccoli  RN provided approval with dietary for additional tray due to pt not having breakfast and blood sugars have been within normal limits, last CBG 79  Will continue to monitor

## 2018-08-08 NOTE — Care Management Note (Signed)
43 year old male with family history of coronary artery disease admitted for sudden onset of left arm pain while he was watching TV he did not have any chest pain he is diabetic and overweight with a family history he was given sublingual nitroglycerin with improvement  of his arm pain EKG showing T wave inversion, troponin is negative he took aspirin earlier at home before coming to urgency department.

## 2018-08-08 NOTE — Progress Notes (Signed)
Paged MD regarding admission orders  Pt states he has not had any of his morning medications  Awaiting call back

## 2018-08-09 ENCOUNTER — Encounter (HOSPITAL_COMMUNITY): Payer: Self-pay

## 2018-08-09 ENCOUNTER — Observation Stay (HOSPITAL_COMMUNITY): Payer: BLUE CROSS/BLUE SHIELD

## 2018-08-09 DIAGNOSIS — E782 Mixed hyperlipidemia: Secondary | ICD-10-CM | POA: Diagnosis not present

## 2018-08-09 DIAGNOSIS — Z6841 Body Mass Index (BMI) 40.0 and over, adult: Secondary | ICD-10-CM

## 2018-08-09 DIAGNOSIS — E785 Hyperlipidemia, unspecified: Secondary | ICD-10-CM | POA: Diagnosis not present

## 2018-08-09 DIAGNOSIS — I200 Unstable angina: Secondary | ICD-10-CM

## 2018-08-09 DIAGNOSIS — M79602 Pain in left arm: Secondary | ICD-10-CM | POA: Diagnosis not present

## 2018-08-09 DIAGNOSIS — G4733 Obstructive sleep apnea (adult) (pediatric): Secondary | ICD-10-CM | POA: Diagnosis not present

## 2018-08-09 DIAGNOSIS — E119 Type 2 diabetes mellitus without complications: Secondary | ICD-10-CM | POA: Diagnosis not present

## 2018-08-09 LAB — GLUCOSE, CAPILLARY
Glucose-Capillary: 109 mg/dL — ABNORMAL HIGH (ref 70–99)
Glucose-Capillary: 96 mg/dL (ref 70–99)
Glucose-Capillary: 84 mg/dL (ref 70–99)
Glucose-Capillary: 176 mg/dL — ABNORMAL HIGH (ref 70–99)

## 2018-08-09 LAB — BASIC METABOLIC PANEL
Anion gap: 10 (ref 5–15)
BUN: 11 mg/dL (ref 6–20)
CO2: 23 mmol/L (ref 22–32)
Calcium: 9 mg/dL (ref 8.9–10.3)
Chloride: 103 mmol/L (ref 98–111)
Creatinine, Ser: 1.05 mg/dL (ref 0.61–1.24)
GFR calc Af Amer: >60 mL/min (ref >60)
GFR calc non Af Amer: >60 mL/min (ref >60)
Glucose, Bld: 87 mg/dL (ref 70–99)
Potassium: 3.5 mmol/L (ref 3.5–5.1)
Sodium: 136 mmol/L (ref 135–145)

## 2018-08-09 LAB — LIPID PANEL
Cholesterol: 197 mg/dL (ref 0–200)
HDL: 36 mg/dL — ABNORMAL LOW (ref >40)
LDL Cholesterol: 113 mg/dL — ABNORMAL HIGH (ref 0–99)
Total CHOL/HDL Ratio: 5.5 RATIO
Triglycerides: 238 mg/dL — ABNORMAL HIGH (ref <150)
VLDL: 48 mg/dL — ABNORMAL HIGH (ref 0–40)

## 2018-08-09 LAB — HIV ANTIBODY (ROUTINE TESTING W REFLEX): HIV Screen 4th Generation wRfx: NONREACTIVE

## 2018-08-09 MED ORDER — METOPROLOL TARTRATE 50 MG PO TABS
50.0000 mg | ORAL_TABLET | Freq: Once | ORAL | Status: AC | PRN
  Administered 2018-08-09: 50 mg via ORAL
  Filled 2018-08-09: qty 1

## 2018-08-09 MED ORDER — METOPROLOL TARTRATE 5 MG/5ML IV SOLN
2.5000 mg | INTRAVENOUS | Status: AC | PRN
  Administered 2018-08-09 (×3): 2.5 mg via INTRAVENOUS
  Filled 2018-08-09 (×2): qty 5

## 2018-08-09 MED ORDER — IOPAMIDOL (ISOVUE-370) INJECTION 76%
100.0000 mL | Freq: Once | INTRAVENOUS | Status: AC | PRN
  Administered 2018-08-09: 100 mL via INTRAVENOUS

## 2018-08-09 MED ORDER — NITROGLYCERIN 0.4 MG SL SUBL
SUBLINGUAL_TABLET | SUBLINGUAL | Status: AC
  Administered 2018-08-09: 0.4 mg
  Filled 2018-08-09: qty 2

## 2018-08-09 NOTE — Progress Notes (Signed)
Pt has his own home unit, and stated he does not need assistance with it.
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
## 2018-08-09 NOTE — Progress Notes (Signed)
Progress Note    Howard Hays  CZY:[id number] DOB: 1976/05/06  DOA: 08/08/2018 PCP: No primary care provider on file.    Brief Narrative:   Chief complaint: Left arm pain  Medical records reviewed and are as summarized below:  Howard Hays is an 43 y.o. male who has a past medical history significant for OSA, HLD, obesity, and depression/anxiety; who presented with left pain at rest thought to be his anginal equivalent.  Assessment/Plan:   Principal Problem:   Arm pain, lateral, left Active Problems:   Hyperlipidemia   OSA (obstructive sleep apnea)   Diabetes mellitus, type II (HCC)   Obesity   Unstable angina (HCC)  Left arm pain/question angina equivalent: Patient presented with complaints of sudden onset of left arm pain at rest.  Symptoms resolved with aspirin and nitroglycerin.  He denies any recurrence of symptoms at this time.  EKG showed no significant ischemic changes and troponins have been negative x3.  He has multiple cardiac risk factors for which cardiology was consulted. -Will follow-up coronary CT angiogram gram of the chest -Continue aspirin and statin -Appreciate cardiology consultative services and will follow-up for further recommendation  Hyperlipidemia: Lipid panel revealed total cholesterol 197, HDL 36, LDL 113, and triglycerides 238.  Patient was started on high-dose atorvastatin. -Continue atorvastatin  Diabetes mellitus type 2: Controlled. Patient currently on oral medications of metformin.  Last hemoglobin A1c noted to be 6.9 on 05/14/2018.  Metformin was held during his hospital stay.  OSA on CPAP -Continue CPAP  Morbid obesity: Body mass index is 40.11 kg/m.  Encouraged weight loss.  Patient may warrant bariatric referral in outpatient setting.   Family Communication/Anticipated D/C date and plan/Code Status   DVT prophylaxis: Lovenox ordered. Code Status: Full Code.  Family Communication: No family present at bedside Disposition  Plan: Likely discharge home in 1-2 days depending on CT findings.   Medical Consultants:   Cardiology  Anti-Infectives:    None  Subjective:   He reports no recent strenuous activity or heavy lifting to cause symptoms.  He was laying in bed watching TV at the time that they started.  Symptoms resolved after taking nitroglycerin and aspirin.  Denies any recurrence of symptoms overnight.  Reports extended family with history of heart disease.  Objective:    Vitals:   08/08/18 1659 08/08/18 2336 08/09/18 0837 08/09/18 1231  BP: 131/72  118/69 128/82  Pulse: 87 95 74 77  Resp: 17 18  18   Temp:  98.5 F (36.9 C)  98.7 F (37.1 C)  TempSrc:  Oral Oral Oral  SpO2: 97% 95%  97%  Weight:      Height:        Intake/Output Summary (Last 24 hours) at 08/09/2018 1435 Last data filed at 08/08/2018 1922 Gross per 24 hour  Intake 240 ml  Output -  Net 240 ml   Filed Weights   08/08/18 1200  Weight: (!) 137.9 kg    Exam: Constitutional: Obese male in NAD, calm, comfortable Eyes: PERRL, lids and conjunctivae normal ENMT: Mucous membranes are moist. Posterior pharynx clear of any exudate or lesions.  Neck: normal, supple, no masses, no thyromegaly Respiratory: clear to auscultation bilaterally, no wheezing, no crackles. Normal respiratory effort. No accessory muscle use.  Cardiovascular: Regular rate and rhythm, no murmurs / rubs / gallops. No extremity edema. 2+ pedal pulses. No carotid bruits.  Abdomen: no tenderness, no masses palpated. No hepatosplenomegaly. Bowel sounds positive.  Musculoskeletal: no clubbing / cyanosis. No joint  deformity upper and lower extremities. Good ROM, no contractures. Normal muscle tone.  Skin: no rashes, lesions, ulcers. No induration Neurologic: CN 2-12 grossly intact. Sensation intact, DTR normal. Strength 5/5 in all 4.  Psychiatric: Normal judgment and insight. Alert and oriented x 3. Normal mood.    Data Reviewed:   I have personally  reviewed following labs and imaging studies:  Labs: Labs show the following:   Basic Metabolic Panel: Recent Labs  Lab 08/08/18 0347  NA 138  K 3.7  CL 104  CO2 24  GLUCOSE 153*  BUN 12  CREATININE 1.04  CALCIUM 9.2   GFR Estimated Creatinine Clearance: 133.6 mL/min (by C-G formula based on SCr of 1.04 mg/dL). Liver Function Tests: No results for input(s): AST, ALT, ALKPHOS, BILITOT, PROT, ALBUMIN in the last 168 hours. No results for input(s): LIPASE, AMYLASE in the last 168 hours. No results for input(s): AMMONIA in the last 168 hours. Coagulation profile No results for input(s): INR, PROTIME in the last 168 hours.  CBC: Recent Labs  Lab 08/08/18 0347  WBC 9.5  NEUTROABS 6.5  HGB 13.8  HCT 42.8  MCV 88.1  PLT 269   Cardiac Enzymes: Recent Labs  Lab 08/08/18 0347 08/08/18 0955 08/08/18 1533 08/08/18 2130  TROPONINI <0.03 <0.03 <0.03 <0.03   BNP (last 3 results) No results for input(s): PROBNP in the last 8760 hours. CBG: Recent Labs  Lab 08/08/18 1156 08/08/18 1612 08/08/18 2319 08/09/18 0702 08/09/18 1130  GLUCAP 103* 79 142* 109* 96   D-Dimer: No results for input(s): DDIMER in the last 72 hours. Hgb A1c: No results for input(s): HGBA1C in the last 72 hours. Lipid Profile: Recent Labs    08/09/18 1007  CHOL 197  HDL 36*  LDLCALC 113*  TRIG 238*  CHOLHDL 5.5   Thyroid function studies: No results for input(s): TSH, T4TOTAL, T3FREE, THYROIDAB in the last 72 hours.  Invalid input(s): FREET3 Anemia work up: No results for input(s): VITAMINB12, FOLATE, FERRITIN, TIBC, IRON, RETICCTPCT in the last 72 hours. Sepsis Labs: Recent Labs  Lab 08/08/18 0347  WBC 9.5    Microbiology No results found for this or any previous visit (from the past 240 hour(s)).  Procedures and diagnostic studies:  No results found.  Medications:   . aspirin EC  81 mg Oral Daily  . atorvastatin  80 mg Oral Daily  . enoxaparin (LOVENOX) injection  40 mg  Subcutaneous Q24H  . insulin aspart  0-15 Units Subcutaneous TID WC  . insulin aspart  0-5 Units Subcutaneous QHS  . metoprolol tartrate  25 mg Oral BID   Continuous Infusions:   LOS: 0 days   Kemiyah Tarazon A Eiden Bagot  [facility]   *Please refer to [email or web address], password [email or web address] to get updated schedule on who will round on this patient, as hospitalists switch teams weekly. If 7PM-7AM, please contact night-coverage at [email or web address], password [email or web address] for any overnight needs.

## 2018-08-10 ENCOUNTER — Other Ambulatory Visit: Payer: Self-pay | Admitting: Student

## 2018-08-10 DIAGNOSIS — E119 Type 2 diabetes mellitus without complications: Secondary | ICD-10-CM | POA: Diagnosis not present

## 2018-08-10 DIAGNOSIS — I208 Other forms of angina pectoris: Secondary | ICD-10-CM

## 2018-08-10 DIAGNOSIS — M79602 Pain in left arm: Secondary | ICD-10-CM | POA: Diagnosis not present

## 2018-08-10 DIAGNOSIS — E782 Mixed hyperlipidemia: Secondary | ICD-10-CM | POA: Diagnosis not present

## 2018-08-10 LAB — CBC WITH DIFFERENTIAL/PLATELET
Abs Immature Granulocytes: 0.01 10*3/uL (ref 0.00–0.07)
Basophils Absolute: 0 10*3/uL (ref 0.0–0.1)
Basophils Relative: 0 %
Eosinophils Absolute: 0.1 10*3/uL (ref 0.0–0.5)
Eosinophils Relative: 2 %
HCT: 40.5 % (ref 39.0–52.0)
Hemoglobin: 13.4 g/dL (ref 13.0–17.0)
Immature Granulocytes: 0 %
Lymphocytes Relative: 23 %
Lymphs Abs: 1.7 10*3/uL (ref 0.7–4.0)
MCH: 28.8 pg (ref 26.0–34.0)
MCHC: 33.1 g/dL (ref 30.0–36.0)
MCV: 86.9 fL (ref 80.0–100.0)
Monocytes Absolute: 0.8 10*3/uL (ref 0.1–1.0)
Monocytes Relative: 11 %
Neutro Abs: 4.6 10*3/uL (ref 1.7–7.7)
Neutrophils Relative %: 64 %
Platelets: 240 10*3/uL (ref 150–400)
RBC: 4.66 MIL/uL (ref 4.22–5.81)
RDW: 14.5 % (ref 11.5–15.5)
WBC: 7.3 10*3/uL (ref 4.0–10.5)
nRBC: 0 % (ref 0.0–0.2)

## 2018-08-10 LAB — GLUCOSE, CAPILLARY
Glucose-Capillary: 117 mg/dL — ABNORMAL HIGH (ref 70–99)
Glucose-Capillary: 133 mg/dL — ABNORMAL HIGH (ref 70–99)

## 2018-08-10 MED ORDER — NITROGLYCERIN 0.4 MG SL SUBL: 0.4000 mg | SUBLINGUAL_TABLET | SUBLINGUAL | 0 refills | Status: DC | PRN

## 2018-08-10 MED ORDER — FENOFIBRATE 54 MG PO TABS: 54.0000 mg | ORAL_TABLET | Freq: Every day | ORAL | 0 refills | Status: DC

## 2018-08-10 MED ORDER — FENOFIBRATE 54 MG PO TABS
54.0000 mg | ORAL_TABLET | Freq: Every day | ORAL | Status: DC
  Administered 2018-08-10: 54 mg via ORAL
  Filled 2018-08-10: qty 1

## 2018-08-10 MED ORDER — METOPROLOL TARTRATE 25 MG PO TABS: 25.0000 mg | ORAL_TABLET | Freq: Two times a day (BID) | ORAL | 0 refills | Status: DC
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
## 2018-08-10 NOTE — Progress Notes (Signed)
Progress Note  Patient Name: Howard Hays Date of Encounter: 08/10/2018  Primary Cardiologist: Dorris Carnes, MD   Subjective   No chest or arm pain this am.   Inpatient Medications    Scheduled Meds: . aspirin EC  81 mg Oral Daily  . atorvastatin  80 mg Oral Daily  . enoxaparin (LOVENOX) injection  40 mg Subcutaneous Q24H  . fenofibrate  54 mg Oral Daily  . insulin aspart  0-15 Units Subcutaneous TID WC  . insulin aspart  0-5 Units Subcutaneous QHS  . metoprolol tartrate  25 mg Oral BID   Continuous Infusions:  PRN Meds: acetaminophen, ALPRAZolam, morphine injection, ondansetron (ZOFRAN) IV, traMADol   Vital Signs    Vitals:   08/09/18 1701 08/09/18 1833 08/09/18 2128 08/10/18 0639  BP: 130/81 136/80 132/70 120/69  Pulse: 72   72  Resp: 18  18 16   Temp: 98.3 F (36.8 C)   98 F (36.7 C)  TempSrc: Oral   Oral  SpO2: 96% 100% 100% 94%  Weight:      Height:       No intake or output data in the 24 hours ending 08/10/18 1030 Last 3 Weights 08/08/2018 07/23/2018 07/20/2018  Weight (lbs) 304 lb 309 lb 309 lb  Weight (kg) 137.893 kg 140.161 kg 140.161 kg      Telemetry   Sinus - Personally Reviewed  ECG     No am ekg- Personally Reviewed  Physical Exam   GEN: No acute distress.   Neck: No JVD Cardiac: RRR, no murmurs, rubs, or gallops.  Respiratory: Clear to auscultation bilaterally. GI: Soft, nontender, non-distended  MS: No edema; No deformity. Neuro:  Nonfocal  Psych: Normal affect   Labs    Chemistry Recent Labs  Lab 08/08/18 0347 08/09/18 1918  NA 138 136  K 3.7 3.5  CL 104 103  CO2 24 23  GLUCOSE 153* 87  BUN 12 11  CREATININE 1.04 1.05  CALCIUM 9.2 9.0  GFRNONAA >60 >60  GFRAA >60 >60  ANIONGAP 10 10     Hematology Recent Labs  Lab 08/08/18 0347 08/10/18 0515  WBC 9.5 7.3  RBC 4.86 4.66  HGB 13.8 13.4  HCT 42.8 40.5  MCV 88.1 86.9  MCH 28.4 28.8  MCHC 32.2 33.1  RDW 14.3 14.5  PLT 269 240    Cardiac Enzymes Recent  Labs  Lab 08/08/18 0347 08/08/18 0955 08/08/18 1533 08/08/18 2130  TROPONINI <0.03 <0.03 <0.03 <0.03   No results for input(s): TROPIPOC in the last 168 hours.   BNPNo results for input(s): BNP, PROBNP in the last 168 hours.   DDimer No results for input(s): DDIMER in the last 168 hours.   Radiology    Ct Coronary Morph W/cta Cor W/score W/ca W/cm &/or Wo/cm  Result Date: 08/09/2018 EXAM: OVER-READ INTERPRETATION  CT CHEST The following report is an over-read performed by radiologist Dr. Vinnie Langton of [facility], [place] on 08/09/2018. This over-read does not include interpretation of cardiac or coronary anatomy or pathology. The coronary calcium score/coronary CTA interpretation by the cardiologist is attached. COMPARISON:  None. FINDINGS: Within the visualized portions of the thorax there are no suspicious appearing pulmonary nodules or masses, there is no acute consolidative airspace disease, no pleural effusions, no pneumothorax and no lymphadenopathy. Visualized portions of the upper abdomen are unremarkable. There are no aggressive appearing lytic or blastic lesions noted in the visualized portions of the skeleton. IMPRESSION: 1. No significant incidental noncardiac findings are noted.  Electronically Signed   By: Vinnie Langton M.D.   On: 08/09/2018 19:11    Cardiac Studies     Patient Profile     43 y.o. male with history of HLD, DM, OSA on CPAP, GERD and remote tobacco abuse admitted with left arm pain concerning for angina.   Assessment & Plan    1. Left arm pain/angina: Gated coronary CTA has been performed. Final read is pending this am. If there is no worrisome CAD, he can be discharged home today. If there is obstructive CAD, we will need to arrange an inpatient cath.   For questions or updates, please contact [place] Please consult [email or web address] for contact info under     Signed, Lauree Chandler, MD  08/10/2018, 10:30 AM

## 2018-08-10 NOTE — Progress Notes (Signed)
   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 The proximal RCA takes an acute angle anterior to the aorta between the great vessels, and appears at least 50% compressed for an approximately 2.5cm segment. Minimal atherosclerotic CAD also noted in the LAD. Discussed results with Dr. Margaretann Loveless who recommends outpatient Treadmill Myoview for further evaluation with close follow-up with Dr. Angelena Form within 7-10 days as well as follow-up visit with Dr. Roxy Manns in 2-4 weeks to discuss whether bypass is needed. Patient has had no further episodes of left arm pain and has not had any chest pain. He is eager to go home. Discussed the results with the patient and answered all of his questions. Will let primary team know.  Darreld Mclean, PA-C 08/10/2018 2:44 PM

## 2018-08-10 NOTE — Progress Notes (Signed)
Pt discharge education and instructions completed with pt at bedside; pt voices understanding and denies any questions. Pt IV and telemetry removed; pt discharge home and pt to transport himself home. Pt to pick up electronically sent prescriptions from preferred pharmacy on file. Pt transported off unit via wheelchair with belongings to the side. Delia Heady RN

## 2018-08-11 ENCOUNTER — Telehealth: Payer: Self-pay | Admitting: *Deleted

## 2018-08-11 NOTE — Telephone Encounter (Signed)
I have scheduled follow up appointment with Dr. Angelena Form for 08/18/18 at 10:40.  Will ask nuclear scheduling department to contact pt with appointment time for nuclear test.

## 2018-08-11 NOTE — Telephone Encounter (Signed)
-----   Message from Darreld Mclean, [place] sent at 08/11/2018  1:04 PM EST ----- Regarding: Follow Up Visit for Dr. Kathi Der,  This patient was discharged yesterday and needs a follow-up visit with Dr. Angelena Form in 7-10 days per Dr. Margaretann Loveless. Wannetta Sender was unable to find a spot but she said it looked like he had a couple of held spots. Dr. Angelena Form gave [place] permission to use one of his held spots. Can you find a spot for this patient? He also needs a Treadmill Myoview before this follow-up visit. I have already placed the order for the Myoview. Do you mind scheduling both of those and then notifying the patient of the appointment times?  Thank you so much! Callie

## 2018-08-12 NOTE — Telephone Encounter (Signed)
Discussed with Jari Sportsman regarding rescheduling the patient's nuclear test to be done prior to follow up appointment with Dr. Angelena Form. Patient currently scheduled to see him on 08/18/18 and nuclear tests on 08/19/18 and 08/20/18. Was looking at possible rescheduling patient to see Dr. Angelena Form on 08/20/18 if we could move his day 2 scan earlier on 08/20/18. Jari Sportsman will review this with Shirlean Mylar to get approval. Will hold off on reschedule CM appt right now until we hear back from nuclear dept.

## 2018-08-16 ENCOUNTER — Telehealth (HOSPITAL_COMMUNITY): Payer: Self-pay | Admitting: *Deleted

## 2018-08-16 NOTE — Telephone Encounter (Signed)
Nuclear medicine scheduling department has contacted pt and changed nuclear study to 2/25 and 2/26 at our [facility] office.  Appointment to see Dr. Angelena Form has been changed to 08/20/18 at 9:30

## 2018-08-16 NOTE — Telephone Encounter (Signed)
Patient given detailed instructions per Myocardial Perfusion Study Information Sheet for the test on 08/17/18. Patient notified to arrive 15 minutes early and that it is imperative to arrive on time for appointment to keep from having the test rescheduled.  If you need to cancel or reschedule your appointment, please call the office within 24 hours of your appointment. . Patient verbalized understanding.Howard Hays [name]    

## 2018-08-17 ENCOUNTER — Ambulatory Visit (HOSPITAL_COMMUNITY)
Admission: RE | Admit: 2018-08-17 | Discharge: 2018-08-17 | Disposition: A | Discharge status: Home / Self Care | Payer: BLUE CROSS/BLUE SHIELD | Source: Ambulatory Visit | Attending: Cardiology | Admitting: Cardiology

## 2018-08-17 DIAGNOSIS — I208 Other forms of angina pectoris: Secondary | ICD-10-CM

## 2018-08-17 MED ORDER — TECHNETIUM TC 99M TETROFOSMIN IV KIT
30.6000 | PACK | Freq: Once | INTRAVENOUS | Status: AC | PRN
  Administered 2018-08-17: 30.6 via INTRAVENOUS
  Filled 2018-08-17: qty 31

## 2018-08-18 ENCOUNTER — Ambulatory Visit (HOSPITAL_COMMUNITY)
Admission: RE | Admit: 2018-08-18 | Discharge: 2018-08-18 | Disposition: A | Discharge status: Home / Self Care | Payer: BLUE CROSS/BLUE SHIELD | Source: Ambulatory Visit | Attending: Cardiology | Admitting: Cardiology

## 2018-08-18 ENCOUNTER — Ambulatory Visit: Payer: BLUE CROSS/BLUE SHIELD | Admitting: Cardiovascular Disease

## 2018-08-18 LAB — MYOCARDIAL PERFUSION IMAGING
Estimated workload: 7.1 METS
Exercise duration (min): 6 min
Exercise duration (sec): 5 s
LV dias vol: 117 mL (ref 62–150)
LV sys vol: 54 mL
MPHR: 177 {beats}/min
Peak HR: 179 {beats}/min
Percent HR: 101 %
RPE: 17
Rest HR: 85 {beats}/min
SDS: 1
SRS: 0
SSS: 1
TID: 0.81

## 2018-08-18 MED ORDER — TECHNETIUM TC 99M TETROFOSMIN IV KIT
30.3000 | PACK | Freq: Once | INTRAVENOUS | Status: AC | PRN
  Administered 2018-08-18: 30.3 via INTRAVENOUS

## 2018-08-19 ENCOUNTER — Ambulatory Visit (HOSPITAL_COMMUNITY): Payer: BLUE CROSS/BLUE SHIELD
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
## 2018-08-20 ENCOUNTER — Ambulatory Visit (INDEPENDENT_AMBULATORY_CARE_PROVIDER_SITE_OTHER): Payer: BLUE CROSS/BLUE SHIELD | Admitting: Cardiovascular Disease

## 2018-08-20 ENCOUNTER — Ambulatory Visit (HOSPITAL_COMMUNITY): Payer: BLUE CROSS/BLUE SHIELD

## 2018-08-20 ENCOUNTER — Ambulatory Visit: Payer: BLUE CROSS/BLUE SHIELD | Admitting: Cardiovascular Disease

## 2018-08-20 ENCOUNTER — Encounter: Payer: Self-pay | Admitting: Cardiovascular Disease

## 2018-08-20 VITALS — BP 122/76 | HR 70 | Ht 72.0 in | Wt 308.0 lb

## 2018-08-20 DIAGNOSIS — I2510 Atherosclerotic heart disease of native coronary artery without angina pectoris: Secondary | ICD-10-CM

## 2018-08-20 NOTE — Patient Instructions (Signed)
Medication Instructions:  Your physician recommends that you continue on your current medications as directed. Please refer to the Current Medication list given to you today.  If you need a refill on your cardiac medications before your next appointment, please call your pharmacy.   Lab work: none If you have labs (blood work) drawn today and your tests are completely normal, you will receive your results only by: Marland Kitchen MyChart Message (if you have MyChart) OR . A paper copy in the mail If you have any lab test that is abnormal or we need to change your treatment, we will call you to review the results.  Testing/Procedures: none  Follow-Up: At [facility], you and your health needs are our priority.  As part of our continuing mission to provide you with exceptional heart care, we have created designated Provider Care Teams.  These Care Teams include your primary Cardiologist (physician) and Advanced Practice Providers (APPs -  Physician Assistants and Nurse Practitioners) who all work together to provide you with the care you need, when you need it. You will need a follow up appointment in:  12 months.  Please call our office 2 months in advance to schedule this appointment.  You may see Dorris Carnes, MD or one of the following Advanced Practice Providers on your designated Care Team: Richardson Dopp, PA-C [place], [place] . Daune Perch, NP  Any Other Special Instructions Will Be Listed Below (If Applicable).

## 2018-08-26 ENCOUNTER — Encounter: Payer: BLUE CROSS/BLUE SHIELD | Admitting: Thoracic Surgery (Cardiothoracic Vascular Surgery)

## 2018-08-30 ENCOUNTER — Encounter: Payer: Self-pay | Admitting: Thoracic Surgery (Cardiothoracic Vascular Surgery)

## 2018-08-30 ENCOUNTER — Institutional Professional Consult (permissible substitution) (INDEPENDENT_AMBULATORY_CARE_PROVIDER_SITE_OTHER): Payer: BLUE CROSS/BLUE SHIELD | Admitting: Thoracic Surgery (Cardiothoracic Vascular Surgery)

## 2018-08-30 ENCOUNTER — Encounter: Payer: BLUE CROSS/BLUE SHIELD | Admitting: Thoracic Surgery (Cardiothoracic Vascular Surgery)

## 2018-08-30 VITALS — BP 131/85 | HR 78 | Resp 16 | Ht 72.0 in | Wt 299.0 lb

## 2018-08-30 DIAGNOSIS — Q245 Malformation of coronary vessels: Secondary | ICD-10-CM

## 2018-08-30 DIAGNOSIS — Z8619 Personal history of other infectious and parasitic diseases: Secondary | ICD-10-CM
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
## 2018-08-30 NOTE — Patient Instructions (Signed)
Continue all previous medications without any changes at this time  

## 2018-09-09 ENCOUNTER — Encounter: Payer: Self-pay | Admitting: Family Medicine

## 2018-09-10 ENCOUNTER — Encounter: Payer: Self-pay | Admitting: Family Medicine

## 2018-10-24 ENCOUNTER — Encounter: Payer: Self-pay | Admitting: Family Medicine

## 2018-10-25 MED ORDER — BLOOD GLUCOSE SYSTEM PAK KIT: PACK | 1 refills | Status: DC

## 2018-10-26 ENCOUNTER — Encounter: Payer: Self-pay | Admitting: Family Medicine

## 2018-10-26 ENCOUNTER — Other Ambulatory Visit: Payer: Self-pay | Admitting: Family Medicine

## 2018-10-27 MED ORDER — LANCETS MISC: 1 refills | Status: DC

## 2018-10-27 MED ORDER — BLOOD GLUCOSE TEST VI STRP: ORAL_STRIP | 1 refills | Status: DC

## 2018-11-04 ENCOUNTER — Other Ambulatory Visit: Payer: Self-pay | Admitting: Family Medicine

## 2018-11-16 ENCOUNTER — Other Ambulatory Visit: Payer: Self-pay

## 2018-11-17 ENCOUNTER — Ambulatory Visit (INDEPENDENT_AMBULATORY_CARE_PROVIDER_SITE_OTHER): Payer: BLUE CROSS/BLUE SHIELD | Admitting: Family Medicine

## 2018-11-17 VITALS — BP 122/76 | HR 82 | Temp 98.9°F | Resp 18 | Ht 73.0 in | Wt 310.4 lb

## 2018-11-17 DIAGNOSIS — Z6841 Body Mass Index (BMI) 40.0 and over, adult: Secondary | ICD-10-CM

## 2018-11-17 DIAGNOSIS — E782 Mixed hyperlipidemia: Secondary | ICD-10-CM

## 2018-11-17 DIAGNOSIS — E119 Type 2 diabetes mellitus without complications: Secondary | ICD-10-CM | POA: Diagnosis not present

## 2018-11-17 DIAGNOSIS — I493 Ventricular premature depolarization: Secondary | ICD-10-CM | POA: Diagnosis not present

## 2018-11-17 DIAGNOSIS — M542 Cervicalgia: Secondary | ICD-10-CM

## 2018-11-17 NOTE — Progress Notes (Signed)
   Subjective:    Patient ID: Howard Hays, male    DOB: 1976-05-12, 43 y.o.   MRN: [id number]  Patient presents for Neck Pain (discomfort in neck x2.5 weeks off and on, no meds taken)     Pt here with neck pain started 2.5 weeks ago felt a little swelling in the anterior neck. A few days ago, had epigastric discomfort that resolved.  Denies reflux symptoms. Feels more of a muscle burn feeling/ and tightness in the throat.   He did take antihistamines or possible allergy that made things worse   No chest pain,  No SOB, no difficulty   Coughing up some phlegm, no fever, some sinus drainage   He has been working from home, rare outings He was concerned due to his known coronary anomoly, states the fullness and sensation in neck was similar to left arm pain a few months ago. No current arm pain  DM- taking metformin, checked CBG states it has been good, but he has not been consistent with this    Review Of Systems:  GEN- denies fatigue, fever, weight loss,weakness, recent illness HEENT- denies eye drainage, change in vision, nasal discharge, CVS- denies chest pain, palpitations RESP- denies SOB, cough, wheeze ABD- denies N/V, change in stools, abd pain GU- denies dysuria, hematuria, dribbling, incontinence MSK- denies joint pain, muscle aches, injury Neuro- denies headache, dizziness, syncope, seizure activity       Objective:    BP 122/76   Pulse 82   Temp 98.9 F (37.2 C)   Resp 18   Ht 6\' 1"  (1.854 m)   Wt (!) 310 lb 6.4 oz (140.8 kg)   SpO2 96%   BMI 40.95 kg/m  GEN- NAD, alert and oriented x3 HEENT- PERRL, EOMI, non injected sclera, pink conjunctiva, MMM, oropharynx clear, no sinus tenderness, nares clear, TM clear bilat no effusion Neck- Supple, no thyromegaly, no LAD, FROM CVS- RRR, no murmur RESP-CTAB ABD-NABS,soft,NT,ND EXT- No edema Pulses- Radial, DP- 2+   EKG- NSR, no ST changes      Assessment & Plan:      Problem List Items Addressed This Visit       Unprioritized   Diabetes mellitus, type II ([place]) - Primary    Goal is A1C less than 7% Continue metformin, check labs and renal function Discussed dietary changes       Relevant Orders   CBC with Differential/Platelet (Completed)   Comprehensive metabolic panel (Completed)   Hemoglobin A1c (Completed)   Frequent PVCs    Has had angina in past and known PVC, EKG normal No current symptoms Doubt neck pain is cardiac But advised if she does get chest pain SOB to go to ER as instructed by his cardiologist       Relevant Orders   EKG 12-Lead (Completed)   Hyperlipidemia   Relevant Orders   Lipid panel (Completed)   Obesity    Other Visit Diagnoses    Neck pain       no sign of sinusitis, no injury, no LAD, with the GI discomfort and abnormal sensation trial of nexium for reflux      Note: This dictation was prepared with Dragon dictation along with smaller phrase technology. Any transcriptional errors that result from this process are unintentional.

## 2018-11-17 NOTE — Patient Instructions (Signed)
We will call with labs results F/U pending results

## 2018-11-18 ENCOUNTER — Encounter: Payer: Self-pay | Admitting: Family Medicine

## 2018-11-18 LAB — CBC WITH DIFFERENTIAL/PLATELET
Absolute Monocytes: 575 cells/uL (ref 200–950)
Basophils Absolute: 43 cells/uL (ref 0–200)
Basophils Relative: 0.6 %
Eosinophils Absolute: 234 cells/uL (ref 15–500)
Eosinophils Relative: 3.3 %
HCT: 42.9 % (ref 38.5–50.0)
Hemoglobin: 13.9 g/dL (ref 13.2–17.1)
Lymphs Abs: 1931 cells/uL (ref 850–3900)
MCH: 27.9 pg (ref 27.0–33.0)
MCHC: 32.4 g/dL (ref 32.0–36.0)
MCV: 86 fL (ref 80.0–100.0)
MPV: 10.6 fL (ref 7.5–12.5)
Monocytes Relative: 8.1 %
Neutro Abs: 4317 cells/uL (ref 1500–7800)
Neutrophils Relative %: 60.8 %
Platelets: 318 10*3/uL (ref 140–400)
RBC: 4.99 10*6/uL (ref 4.20–5.80)
RDW: 13.1 % (ref 11.0–15.0)
Total Lymphocyte: 27.2 %
WBC: 7.1 10*3/uL (ref 3.8–10.8)

## 2018-11-18 LAB — HEMOGLOBIN A1C
Hgb A1c MFr Bld: 6.4 % of total Hgb — ABNORMAL HIGH (ref <5.7)
Mean Plasma Glucose: 137 (calc)
eAG (mmol/L): 7.6 (calc)

## 2018-11-18 LAB — COMPREHENSIVE METABOLIC PANEL
AG Ratio: 1.2 (calc) (ref 1.0–2.5)
ALT: 35 U/L (ref 9–46)
AST: 23 U/L (ref 10–40)
Albumin: 3.9 g/dL (ref 3.6–5.1)
Alkaline phosphatase (APISO): 87 U/L (ref 36–130)
BUN: 13 mg/dL (ref 7–25)
CO2: 25 mmol/L (ref 20–32)
Calcium: 9.6 mg/dL (ref 8.6–10.3)
Chloride: 106 mmol/L (ref 98–110)
Creat: 1.19 mg/dL (ref 0.60–1.35)
Globulin: 3.3 g/dL (calc) (ref 1.9–3.7)
Glucose, Bld: 114 mg/dL — ABNORMAL HIGH (ref 65–99)
Potassium: 4.8 mmol/L (ref 3.5–5.3)
Sodium: 142 mmol/L (ref 135–146)
Total Bilirubin: 0.4 mg/dL (ref 0.2–1.2)
Total Protein: 7.2 g/dL (ref 6.1–8.1)

## 2018-11-18 LAB — LIPID PANEL
Cholesterol: 176 mg/dL (ref <200)
HDL: 35 mg/dL — ABNORMAL LOW (ref >=40)
LDL Cholesterol (Calc): 109 mg/dL (calc) — ABNORMAL HIGH
Non-HDL Cholesterol (Calc): 141 mg/dL (calc) — ABNORMAL HIGH (ref <130)
Total CHOL/HDL Ratio: 5 (calc) — ABNORMAL HIGH (ref <5.0)
Triglycerides: 197 mg/dL — ABNORMAL HIGH (ref <150)

## 2018-11-18 NOTE — Assessment & Plan Note (Signed)
Has had angina in past and known PVC, EKG normal No current symptoms Doubt neck pain is cardiac But advised if she does get chest pain SOB to go to ER as instructed by his cardiologist

## 2018-11-18 NOTE — Assessment & Plan Note (Addendum)
Goal is A1C less than 7% Continue metformin, check labs and renal function Discussed dietary changes

## 2018-11-22 MED ORDER — FENOFIBRATE 54 MG PO TABS: 54.0000 mg | ORAL_TABLET | Freq: Every day | ORAL | 0 refills | Status: DC

## 2018-11-23 ENCOUNTER — Other Ambulatory Visit: Payer: Self-pay | Admitting: Family Medicine

## 2018-11-23 NOTE — Telephone Encounter (Signed)
Requested Prescriptions   Pending Prescriptions Disp Refills  . ALPRAZolam (XANAX) 0.5 MG tablet [Pharmacy Med Name: ALPRAZOLAM 0.5 MG TABLET] 30 tablet     Sig: TAKE 1 TABLET BY MOUTH TWICE A DAY AS NEEDED FOR ANXIETY   Last OV 11/17/2018 Last written 04/30/2018

## 2018-12-17 ENCOUNTER — Other Ambulatory Visit: Payer: Self-pay | Admitting: Family Medicine

## 2018-12-23 ENCOUNTER — Other Ambulatory Visit: Payer: Self-pay | Admitting: Family Medicine

## 2019-02-16 ENCOUNTER — Other Ambulatory Visit: Payer: Self-pay

## 2019-02-17 ENCOUNTER — Ambulatory Visit: Payer: BLUE CROSS/BLUE SHIELD

## 2019-02-25 ENCOUNTER — Other Ambulatory Visit: Payer: Self-pay | Admitting: Family Medicine

## 2019-06-03 ENCOUNTER — Other Ambulatory Visit: Payer: Self-pay

## 2019-06-06 LAB — SARS-COV-2 RNA,(COVID-19) QUALITATIVE NAAT: SARS CoV2 RNA: NOT DETECTED

## 2019-06-20 ENCOUNTER — Other Ambulatory Visit: Payer: Self-pay | Admitting: Family Medicine

## 2019-08-08 ENCOUNTER — Ambulatory Visit: Payer: BLUE CROSS/BLUE SHIELD | Admitting: Internal Medicine

## 2019-08-08 ENCOUNTER — Encounter: Payer: Self-pay | Admitting: Family Medicine

## 2019-08-29 ENCOUNTER — Ambulatory Visit: Payer: 59 | Admitting: Internal Medicine

## 2019-09-22 ENCOUNTER — Encounter: Payer: Self-pay | Admitting: Family Medicine

## 2019-09-26 MED ORDER — ATORVASTATIN CALCIUM 80 MG PO TABS: ORAL_TABLET | ORAL | 1 refills | Status: DC

## 2019-10-03 ENCOUNTER — Encounter: Payer: Self-pay | Admitting: Family Medicine

## 2019-10-03 ENCOUNTER — Ambulatory Visit (INDEPENDENT_AMBULATORY_CARE_PROVIDER_SITE_OTHER): Payer: 59 | Admitting: Family Medicine

## 2019-10-03 ENCOUNTER — Other Ambulatory Visit: Payer: Self-pay

## 2019-10-03 VITALS — BP 118/64 | HR 96 | Temp 98.4°F | Resp 14 | Ht 73.0 in | Wt 289.0 lb

## 2019-10-03 DIAGNOSIS — E119 Type 2 diabetes mellitus without complications: Secondary | ICD-10-CM

## 2019-10-03 DIAGNOSIS — L84 Corns and callosities: Secondary | ICD-10-CM

## 2019-10-03 DIAGNOSIS — E669 Obesity, unspecified: Secondary | ICD-10-CM | POA: Diagnosis not present

## 2019-10-03 DIAGNOSIS — I493 Ventricular premature depolarization: Secondary | ICD-10-CM

## 2019-10-03 DIAGNOSIS — E782 Mixed hyperlipidemia: Secondary | ICD-10-CM | POA: Diagnosis not present

## 2019-10-03 DIAGNOSIS — B353 Tinea pedis: Secondary | ICD-10-CM

## 2019-10-03 NOTE — Progress Notes (Signed)
Subjective:    Patient ID: Howard Hays, male    DOB: February 18, 1976, 44 y.o.   MRN: [id number]  Patient presents for Foot Issues (dead skin)   Pt here secondary to skin problem on his feet.  He has had dead skin and callus skin on his heels and in between the toes for months.  He used petit egg Lamisil and another natural ointment for his feet and it actually cleared up states that his skin was completely smooth but then it came back.  States that he was speaking with his mother is concerned that he may have some toxins causing his feet to change though he wants to come in to be evaluated.  He denies any pain denies any swelling no tingling or numbness in his feet.   DM-   he is currently on Metformin 500 mg once a day his last A1c was back in May 2020 it was 6.4%.  He has not checked his blood sugar.  He has been trying to control his blood sugar with his weight.  He went on low-carb high-protein diet pretty much ketogenic for months.  He was able to get down to a low weight of 273 pounds but then has picked up some weight recently his he has not been able to meal prep and stay on the plan.  He was eating 6 meals a day basically every 2 hours.  From our last visit however he is still down 21 pounds.  Hyperlipidemia he has been taking his Lipitor as prescribed as well as fenofibrate  PVC  he is on metoprolol 25 mg twice a day     Review Of Systems:  GEN- denies fatigue, fever, weight loss,weakness, recent illness HEENT- denies eye drainage, change in vision, nasal discharge, CVS- denies chest pain, palpitations RESP- denies SOB, cough, wheeze ABD- denies N/V, change in stools, abd pain GU- denies dysuria, hematuria, dribbling, incontinence MSK- denies joint pain, muscle aches, injury Neuro- denies headache, dizziness, syncope, seizure activity       Objective:    BP 118/64   Pulse 96   Temp 98.4 F (36.9 C) (Temporal)   Resp 14   Ht 6\' 1"  (1.854 m)   Wt 289 lb (131.1 kg)   SpO2  96%   BMI 38.13 kg/m  GEN- NAD, alert and oriented x3 HEENT- PERRL, EOMI, non injected sclera, pink conjunctiva, MMM, oropharynx clear Neck- Supple, no thyromegaly CVS- RRR, no murmur RESP-CTAB ABD-NABS,soft,NT,ND EXT- No edema Skin callused heels bilaterally as well as edges of feet cracking between the toes with maceration right greater than left Pulses- Radial, DP- 2+        Assessment & Plan:      Problem List Items Addressed This Visit      Unprioritized   Class 2 obesity    We discussed may be backing off of the straight ketogenic portion of his diet and still sticking with lower carb protein.  He did have significant constipation when he was on the diet which is one of the main reasons he stopped doing it.  I think he can add in some low glycemic fruits with higher fiber      Diabetes mellitus, type II (HCC) - Primary    Check A1c.  He would like to come off the Metformin eventually.  We will also check his lipid panel.  He has signs of tinea pedis as well as callus formation.  This is likely just a chronic inflamed state of  his skin.  However will check his labs.  I think we may be able to use terbinafine orally if his liver function is good but he would need to take a break from his statin drug for a few weeks.  Other option is topical terbinafine and a possible use of urea cream on the heels to soften this area.      Relevant Orders   CBC with Differential/Platelet   Comprehensive metabolic panel   Lipid panel   Hemoglobin A1c   TSH   Frequent PVCs    Controlled with beta-blocker no changes.      Hyperlipidemia   Relevant Orders   Lipid panel    Other Visit Diagnoses    Tinea pedis of both feet       Skin callus          Note: This dictation was prepared with Dragon dictation along with smaller phrase technology. Any transcriptional errors that result from this process are unintentional.

## 2019-10-03 NOTE — Patient Instructions (Signed)
F/u 3 MONTHS for physical

## 2019-10-03 NOTE — Assessment & Plan Note (Signed)
Check A1c.  He would like to come off the Metformin eventually.  We will also check his lipid panel.  He has signs of tinea pedis as well as callus formation.  This is likely just a chronic inflamed state of his skin.  However will check his labs.  I think we may be able to use terbinafine orally if his liver function is good but he would need to take a break from his statin drug for a few weeks.  Other option is topical terbinafine and a possible use of urea cream on the heels to soften this area.

## 2019-10-03 NOTE — Assessment & Plan Note (Signed)
We discussed may be backing off of the straight ketogenic portion of his diet and still sticking with lower carb protein.  He did have significant constipation when he was on the diet which is one of the main reasons he stopped doing it.  I think he can add in some low glycemic fruits with higher fiber

## 2019-10-03 NOTE — Assessment & Plan Note (Signed)
Controlled with beta-blocker no changes.

## 2019-10-04 LAB — LIPID PANEL
Cholesterol: 228 mg/dL — ABNORMAL HIGH (ref <200)
HDL: 38 mg/dL — ABNORMAL LOW (ref >=40)
LDL Cholesterol (Calc): 150 mg/dL (calc) — ABNORMAL HIGH
Non-HDL Cholesterol (Calc): 190 mg/dL (calc) — ABNORMAL HIGH (ref <130)
Total CHOL/HDL Ratio: 6 (calc) — ABNORMAL HIGH (ref <5.0)
Triglycerides: 238 mg/dL — ABNORMAL HIGH (ref <150)

## 2019-10-04 LAB — CBC WITH DIFFERENTIAL/PLATELET
Absolute Monocytes: 502 cells/uL (ref 200–950)
Basophils Absolute: 37 cells/uL (ref 0–200)
Basophils Relative: 0.6 %
Eosinophils Absolute: 93 cells/uL (ref 15–500)
Eosinophils Relative: 1.5 %
HCT: 43 % (ref 38.5–50.0)
Hemoglobin: 14 g/dL (ref 13.2–17.1)
Lymphs Abs: 1618 cells/uL (ref 850–3900)
MCH: 27.8 pg (ref 27.0–33.0)
MCHC: 32.6 g/dL (ref 32.0–36.0)
MCV: 85.5 fL (ref 80.0–100.0)
MPV: 10.3 fL (ref 7.5–12.5)
Monocytes Relative: 8.1 %
Neutro Abs: 3949 cells/uL (ref 1500–7800)
Neutrophils Relative %: 63.7 %
Platelets: 286 10*3/uL (ref 140–400)
RBC: 5.03 10*6/uL (ref 4.20–5.80)
RDW: 13.9 % (ref 11.0–15.0)
Total Lymphocyte: 26.1 %
WBC: 6.2 10*3/uL (ref 3.8–10.8)

## 2019-10-04 LAB — HEMOGLOBIN A1C
Hgb A1c MFr Bld: 5.9 % of total Hgb — ABNORMAL HIGH (ref <5.7)
Mean Plasma Glucose: 123 (calc)
eAG (mmol/L): 6.8 (calc)

## 2019-10-04 LAB — COMPREHENSIVE METABOLIC PANEL
AG Ratio: 1.2 (calc) (ref 1.0–2.5)
ALT: 20 U/L (ref 9–46)
AST: 17 U/L (ref 10–40)
Albumin: 4.1 g/dL (ref 3.6–5.1)
Alkaline phosphatase (APISO): 66 U/L (ref 36–130)
BUN/Creatinine Ratio: 9 (calc) (ref 6–22)
BUN: 14 mg/dL (ref 7–25)
CO2: 26 mmol/L (ref 20–32)
Calcium: 9.4 mg/dL (ref 8.6–10.3)
Chloride: 105 mmol/L (ref 98–110)
Creat: 1.5 mg/dL — ABNORMAL HIGH (ref 0.60–1.35)
Globulin: 3.3 g/dL (calc) (ref 1.9–3.7)
Glucose, Bld: 95 mg/dL (ref 65–99)
Potassium: 4.2 mmol/L (ref 3.5–5.3)
Sodium: 140 mmol/L (ref 135–146)
Total Bilirubin: 0.3 mg/dL (ref 0.2–1.2)
Total Protein: 7.4 g/dL (ref 6.1–8.1)

## 2019-10-04 LAB — TSH: TSH: 1.28 mIU/L (ref 0.40–4.50)

## 2019-10-05 ENCOUNTER — Other Ambulatory Visit: Payer: Self-pay | Admitting: *Deleted

## 2019-10-05 DIAGNOSIS — N179 Acute kidney failure, unspecified: Secondary | ICD-10-CM

## 2019-10-05 MED ORDER — UREA 10 % EX CREA: TOPICAL_CREAM | Freq: Every day | CUTANEOUS | 0 refills | Status: DC

## 2019-10-06 ENCOUNTER — Other Ambulatory Visit: Payer: Self-pay | Admitting: *Deleted

## 2019-10-06 MED ORDER — UREA 20 20 % EX LOTN: 1.0000 "application " | TOPICAL_LOTION | Freq: Every day | CUTANEOUS | 3 refills | Status: DC

## 2019-10-07 ENCOUNTER — Other Ambulatory Visit: Payer: Self-pay | Admitting: *Deleted

## 2019-10-07 MED ORDER — UREA 20 % EX CREA: TOPICAL_CREAM | CUTANEOUS | 0 refills | Status: DC | PRN

## 2019-10-12 ENCOUNTER — Encounter: Payer: Self-pay | Admitting: Family Medicine

## 2019-10-12 ENCOUNTER — Other Ambulatory Visit: Payer: Self-pay

## 2019-10-12 ENCOUNTER — Ambulatory Visit (INDEPENDENT_AMBULATORY_CARE_PROVIDER_SITE_OTHER): Payer: 59 | Admitting: Family Medicine

## 2019-10-12 VITALS — BP 134/80 | HR 60 | Temp 98.0°F | Resp 14 | Ht 73.0 in | Wt 284.0 lb

## 2019-10-12 DIAGNOSIS — E782 Mixed hyperlipidemia: Secondary | ICD-10-CM

## 2019-10-12 DIAGNOSIS — N179 Acute kidney failure, unspecified: Secondary | ICD-10-CM

## 2019-10-12 LAB — URINALYSIS, ROUTINE W REFLEX MICROSCOPIC
Bilirubin Urine: NEGATIVE
Glucose, UA: NEGATIVE
Hgb urine dipstick: NEGATIVE
Ketones, ur: NEGATIVE
Leukocytes,Ua: NEGATIVE
Nitrite: NEGATIVE
Protein, ur: NEGATIVE
Specific Gravity, Urine: 1.025 (ref 1.001–1.03)
pH: 5.5 (ref 5.0–8.0)

## 2019-10-12 NOTE — Patient Instructions (Signed)
F/U pending results   

## 2019-10-12 NOTE — Progress Notes (Signed)
   Subjective:    Patient ID: Howard Hays, male    DOB: October 05, 1975, 44 y.o.   MRN: [id number]  Patient presents for Follow-up (ARF)  Patient here for follow-up acute renal failure.  He had labs done last week which showed acute.  Creatinine was 1.5 BUN was preserved.  I do not have any other labs to compare besides his last set of labs back in May 2020.  He has not had any difficulty urinating no changes bowels no swelling.  He actually came in due to the peeling on his feet which was tinea pedis.  His cholesterol is also elevated though he had had something to eat and triglycerides were higher at 38.  But then he also states that he had not been taking Lipitor consistently.  Per previous visit he was on a high-protein low-fat ketogenic-like diet for months.   After I reviewed his labs I had him discontinue Metformin as his A1c was 5.9% and he had a renal failure noted he actually stopped most of his medications as he was confused on what to do.  He has been hydrating with water.  He is due today for recheck.    Review Of Systems:  GEN- denies fatigue, fever, weight loss,weakness, recent illness HEENT- denies eye drainage, change in vision, nasal discharge, CVS- denies chest pain, palpitations RESP- denies SOB, cough, wheeze ABD- denies N/V, change in stools, abd pain GU- denies dysuria, hematuria, dribbling, incontinence MSK- denies joint pain, muscle aches, injury Neuro- denies headache, dizziness, syncope, seizure activity       Objective:    BP 134/80   Pulse 60   Temp 98 F (36.7 C) (Temporal)   Resp 14   Ht 6\' 1"  (1.854 m)   Wt 284 lb (128.8 kg)   SpO2 98%   BMI 37.47 kg/m  GEN- NAD, alert and oriented x3 Neck- Supple, no thyromegaly CVS- RRR, no murmur RESP-CTAB EXT- No edema Pulses- Radial, DP- 2+        Assessment & Plan:      Problem List Items Addressed This Visit      Unprioritized   Hyperlipidemia   Relevant Orders   Lipid panel    Other Visit  Diagnoses    Acute renal failure, unspecified acute renal failure type ([place])    -  Primary   Labs reviewed in detail at the bedside.  We will recheck urinalysis along with urine micro and metabolic if for some reason renal function is not improving now will obtain renal ultrasound.  He will stay off of the Metformin for now. Discussed some dietary changes he can make without overloading on the protein.  For His cholesterol he is [place] restart Lipitor as prescribed   Relevant Orders   BASIC METABOLIC PANEL WITH GFR   Urinalysis, Routine w reflex microscopic (Completed)   Microalbumin / creatinine urine ratio      Note: This dictation was prepared with Dragon dictation along with smaller phrase technology. Any transcriptional errors that result from this process are unintentional.

## 2019-10-13 LAB — BASIC METABOLIC PANEL WITH GFR
BUN: 12 mg/dL (ref 7–25)
CO2: 27 mmol/L (ref 20–32)
Calcium: 9.3 mg/dL (ref 8.6–10.3)
Chloride: 105 mmol/L (ref 98–110)
Creat: 1.3 mg/dL (ref 0.60–1.35)
GFR, Est African American: 77 mL/min/{1.73_m2} (ref >=60)
GFR, Est Non African American: 66 mL/min/{1.73_m2} (ref >=60)
Glucose, Bld: 86 mg/dL (ref 65–99)
Potassium: 3.9 mmol/L (ref 3.5–5.3)
Sodium: 139 mmol/L (ref 135–146)

## 2019-10-13 LAB — MICROALBUMIN / CREATININE URINE RATIO
Creatinine, Urine: 244 mg/dL (ref 20–320)
Microalb Creat Ratio: 2 mcg/mg creat (ref <30)
Microalb, Ur: 0.4 mg/dL

## 2019-10-13 LAB — LIPID PANEL
Cholesterol: 194 mg/dL (ref <200)
HDL: 40 mg/dL (ref >=40)
LDL Cholesterol (Calc): 130 mg/dL (calc) — ABNORMAL HIGH
Non-HDL Cholesterol (Calc): 154 mg/dL (calc) — ABNORMAL HIGH (ref <130)
Total CHOL/HDL Ratio: 4.9 (calc) (ref <5.0)
Triglycerides: 125 mg/dL (ref <150)

## 2019-10-27 ENCOUNTER — Encounter: Payer: Self-pay | Admitting: Family Medicine

## 2019-10-28 MED ORDER — CICLOPIROX 8 % EX SOLN: Freq: Every day | CUTANEOUS | 3 refills | Status: DC

## 2019-10-28 NOTE — Telephone Encounter (Signed)
Penlac sent to pharmacy Apply daily for 1 week, then remove, then restart process

## 2019-11-14 ENCOUNTER — Other Ambulatory Visit: Payer: Self-pay | Admitting: Family Medicine

## 2019-11-18 ENCOUNTER — Ambulatory Visit: Payer: 59 | Admitting: Internal Medicine

## 2019-12-24 ENCOUNTER — Encounter: Payer: Self-pay | Admitting: Family Medicine

## 2020-02-10 ENCOUNTER — Ambulatory Visit: Payer: Self-pay | Admitting: Internal Medicine

## 2020-03-21 ENCOUNTER — Other Ambulatory Visit: Payer: Self-pay

## 2020-03-21 ENCOUNTER — Encounter: Payer: Self-pay | Admitting: Family Medicine

## 2020-03-21 ENCOUNTER — Ambulatory Visit (INDEPENDENT_AMBULATORY_CARE_PROVIDER_SITE_OTHER): Payer: Self-pay | Admitting: Family Medicine

## 2020-03-21 VITALS — BP 128/70 | HR 80 | Temp 98.1°F | Resp 14 | Ht 73.0 in | Wt 304.0 lb

## 2020-03-21 DIAGNOSIS — E669 Obesity, unspecified: Secondary | ICD-10-CM

## 2020-03-21 DIAGNOSIS — I493 Ventricular premature depolarization: Secondary | ICD-10-CM

## 2020-03-21 DIAGNOSIS — R609 Edema, unspecified: Secondary | ICD-10-CM

## 2020-03-21 DIAGNOSIS — E119 Type 2 diabetes mellitus without complications: Secondary | ICD-10-CM

## 2020-03-21 MED ORDER — HYDROCHLOROTHIAZIDE 25 MG PO TABS: ORAL_TABLET | ORAL | 3 refills | Status: DC

## 2020-03-21 MED FILL — HYDROCHLOROTHIAZIDE 25 MG T: 25 | 30 days supply | Qty: 30 | Fill #0

## 2020-03-21 NOTE — Patient Instructions (Signed)
We will call with lab results Take HCTZ as needed for swelling F/U 3 months

## 2020-03-21 NOTE — Progress Notes (Signed)
   Subjective:    Patient ID: Howard Hays, male    DOB: [birth year], 44 y.o.   MRN: [id number]  Patient presents for Edema (noticed B ankles swollen over weekend- resolved by the next AM- R>L)  Pt here with swelling in legs , he had swelling his ankles on Sunday it resolved after 24 hours   This was the only episode   He is up on his feet a lot with work   He has eaten more processed foods, salty foods, weight back up to 300lbs,  No SOB, no chest pain  He does get indentation in legs from socks most days   HTN- he is taking bystolic but noticed more PVC , he has appt with cardiology next  Month   DM- last A1C 5.9%, no longer on metformin , feels like his sugar is going to be high  He walks get 6000 steps      Review Of Systems:  GEN- denies fatigue, fever, weight loss,weakness, recent illness HEENT- denies eye drainage, change in vision, nasal discharge, CVS- denies chest pain,+ palpitations RESP- denies SOB, cough, wheeze ABD- denies N/V, change in stools, abd pain GU- denies dysuria, hematuria, dribbling, incontinence MSK- denies joint pain, muscle aches, injury Neuro- denies headache, dizziness, syncope, seizure activity       Objective:    BP 128/70   Pulse 80   Temp 98.1 F (36.7 C) (Temporal)   Resp 14   Ht 6\' 1"  (1.854 m)   Wt (!) 304 lb (137.9 kg)   SpO2 96%   BMI 40.11 kg/m  GEN- NAD, alert and oriented x3 HEENT- PERRL, EOMI, non injected sclera, pink conjunctiva, MMM, oropharynx clear Neck- Supple, no thyromegaly, no JVD CVS- RRR, no murmur RESP-CTAB ABD-NABS,soft,NT,ND EXT- trace ankle edema Pulses- Radial, DP- 2+  EKG NSR       Assessment & Plan:      Problem List Items Addressed This Visit      Unprioritized   Class 3 obesity   Diabetes mellitus, type II ([place])    Check A1C, restart meds pending results  For peripheral edema, reduce salt foods, processed foods Discussed low carb diet for weight loss and sugar control Given HCTZ  25MG  PRN LEG SWELLING Check renal and liver function       Relevant Orders   CBC with Differential/Platelet (Completed)   Comprehensive metabolic panel (Completed)   Hemoglobin A1c (Completed)   Lipid panel (Completed)   TSH (Completed)   Frequent PVCs    EKG reassuring, decrease caffiene F/u cardiology continue BB      Relevant Medications   hydrochlorothiazide (HYDRODIURIL) 25 MG tablet   Other Relevant Orders   TSH (Completed)   EKG 12-Lead (Completed)    Other Visit Diagnoses    Peripheral edema    -  Primary   Relevant Orders   EKG 12-Lead (Completed)      Note: This dictation was prepared with Dragon dictation along with smaller phrase technology. Any transcriptional errors that result from this process are unintentional.

## 2020-03-22 ENCOUNTER — Encounter: Payer: Self-pay | Admitting: Family Medicine

## 2020-03-22 LAB — LIPID PANEL
Cholesterol: 183 mg/dL (ref <200)
HDL: 38 mg/dL — ABNORMAL LOW (ref >=40)
LDL Cholesterol (Calc): 118 mg/dL (calc) — ABNORMAL HIGH
Non-HDL Cholesterol (Calc): 145 mg/dL (calc) — ABNORMAL HIGH (ref <130)
Total CHOL/HDL Ratio: 4.8 (calc) (ref <5.0)
Triglycerides: 154 mg/dL — ABNORMAL HIGH (ref <150)

## 2020-03-22 LAB — COMPREHENSIVE METABOLIC PANEL
AG Ratio: 1.4 (calc) (ref 1.0–2.5)
ALT: 25 U/L (ref 9–46)
AST: 20 U/L (ref 10–40)
Albumin: 4.1 g/dL (ref 3.6–5.1)
Alkaline phosphatase (APISO): 70 U/L (ref 36–130)
BUN: 15 mg/dL (ref 7–25)
CO2: 27 mmol/L (ref 20–32)
Calcium: 9 mg/dL (ref 8.6–10.3)
Chloride: 106 mmol/L (ref 98–110)
Creat: 1.17 mg/dL (ref 0.60–1.35)
Globulin: 2.9 g/dL (calc) (ref 1.9–3.7)
Glucose, Bld: 95 mg/dL (ref 65–99)
Potassium: 4.4 mmol/L (ref 3.5–5.3)
Sodium: 141 mmol/L (ref 135–146)
Total Bilirubin: 0.4 mg/dL (ref 0.2–1.2)
Total Protein: 7 g/dL (ref 6.1–8.1)

## 2020-03-22 LAB — HEMOGLOBIN A1C
Hgb A1c MFr Bld: 6.2 % of total Hgb — ABNORMAL HIGH (ref <5.7)
Mean Plasma Glucose: 131 (calc)
eAG (mmol/L): 7.3 (calc)

## 2020-03-22 LAB — CBC WITH DIFFERENTIAL/PLATELET
Absolute Monocytes: 405 cells/uL (ref 200–950)
Basophils Absolute: 40 cells/uL (ref 0–200)
Basophils Relative: 0.7 %
Eosinophils Absolute: 80 cells/uL (ref 15–500)
Eosinophils Relative: 1.4 %
HCT: 43.8 % (ref 38.5–50.0)
Hemoglobin: 14.2 g/dL (ref 13.2–17.1)
Lymphs Abs: 1385 cells/uL (ref 850–3900)
MCH: 28.4 pg (ref 27.0–33.0)
MCHC: 32.4 g/dL (ref 32.0–36.0)
MCV: 87.6 fL (ref 80.0–100.0)
MPV: 10.4 fL (ref 7.5–12.5)
Monocytes Relative: 7.1 %
Neutro Abs: 3791 cells/uL (ref 1500–7800)
Neutrophils Relative %: 66.5 %
Platelets: 284 10*3/uL (ref 140–400)
RBC: 5 10*6/uL (ref 4.20–5.80)
RDW: 13.9 % (ref 11.0–15.0)
Total Lymphocyte: 24.3 %
WBC: 5.7 10*3/uL (ref 3.8–10.8)

## 2020-03-22 LAB — TSH: TSH: 1.13 mIU/L (ref 0.40–4.50)

## 2020-03-22 NOTE — Assessment & Plan Note (Signed)
EKG reassuring, decrease caffiene F/u cardiology continue BB

## 2020-03-22 NOTE — Assessment & Plan Note (Signed)
Check A1C, restart meds pending results  For peripheral edema, reduce salt foods, processed foods Discussed low carb diet for weight loss and sugar control Given HCTZ 25MG  PRN LEG SWELLING Check renal and liver function

## 2020-03-26 ENCOUNTER — Encounter: Payer: Self-pay | Admitting: Family Medicine

## 2020-03-26 ENCOUNTER — Other Ambulatory Visit: Payer: Self-pay | Admitting: Family Medicine

## 2020-03-26 MED ORDER — FENOFIBRATE 54 MG PO TABS
54.0000 mg | ORAL_TABLET | Freq: Every day | ORAL | 1 refills | Status: DC
Start: 2020-03-26 — End: 2020-03-26

## 2020-03-26 MED ORDER — METFORMIN HCL 500 MG PO TABS: 500.0000 mg | ORAL_TABLET | Freq: Every day | ORAL | 3 refills | Status: DC

## 2020-03-26 MED ORDER — ATORVASTATIN CALCIUM 80 MG PO TABS: ORAL_TABLET | ORAL | 1 refills | Status: DC

## 2020-03-26 MED ORDER — METOPROLOL TARTRATE 25 MG PO TABS
25.0000 mg | ORAL_TABLET | Freq: Two times a day (BID) | ORAL | 1 refills | Status: DC
Start: 2020-03-26 — End: 2020-04-16

## 2020-03-26 MED FILL — METOPROLOL TARTRATE 25 MG T: 25 | 90 days supply | Qty: 180 | Fill #0

## 2020-03-26 MED FILL — ATORVASTATIN 80 MG TABLET: 80 | 90 days supply | Qty: 90 | Fill #0

## 2020-03-26 MED FILL — FENOFIBRATE 54 MG TABLET: 54 | 90 days supply | Qty: 90 | Fill #0

## 2020-03-26 MED FILL — METFORMIN HCL 500 MG TABS: 500 | 90 days supply | Qty: 90 | Fill #0
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
## 2020-04-16 ENCOUNTER — Encounter: Payer: Self-pay | Admitting: Internal Medicine

## 2020-04-16 ENCOUNTER — Other Ambulatory Visit: Payer: Self-pay

## 2020-04-16 ENCOUNTER — Ambulatory Visit (INDEPENDENT_AMBULATORY_CARE_PROVIDER_SITE_OTHER): Payer: No Typology Code available for payment source | Admitting: Internal Medicine

## 2020-04-16 ENCOUNTER — Encounter: Payer: Self-pay | Admitting: *Deleted

## 2020-04-16 ENCOUNTER — Other Ambulatory Visit: Payer: Self-pay | Admitting: Internal Medicine

## 2020-04-16 VITALS — BP 130/74 | HR 71 | Ht 73.0 in | Wt 311.6 lb

## 2020-04-16 DIAGNOSIS — I2510 Atherosclerotic heart disease of native coronary artery without angina pectoris: Secondary | ICD-10-CM

## 2020-04-16 DIAGNOSIS — R002 Palpitations: Secondary | ICD-10-CM

## 2020-04-16 MED ORDER — ROSUVASTATIN CALCIUM 20 MG PO TABS: 20.0000 mg | ORAL_TABLET | Freq: Every day | ORAL | 3 refills | Status: DC

## 2020-04-16 MED ORDER — METOPROLOL SUCCINATE ER 50 MG PO TB24: 50.0000 mg | ORAL_TABLET | Freq: Every day | ORAL | 3 refills | Status: DC

## 2020-04-16 MED FILL — ROSUVASTATIN CALCIUM 20 MG: 20 | 90 days supply | Qty: 90 | Fill #0

## 2020-04-16 MED FILL — METOPROLOL SUCCINATE ER 50: 50 | 90 days supply | Qty: 90 | Fill #0

## 2020-04-16 NOTE — Patient Instructions (Addendum)
Medication Instructions:  Your physician has recommended you make the following change in your medication:  1.) stop atorvastatin 2.) stop metoprolol tartrate 3.) start rosuvastatin 20 mg (Crestor) one tablet daily 4.) start metoprolol succinate 50 mg (Toprol XL) one tablet daily   *If you need a refill on your cardiac medications before your next appointment, please call your pharmacy*   Lab Work: In 2 months --please return for lipid panel   Testing/Procedures: [place] Monitor Instructions   Your physician has requested you wear your ZIO patch monitor___3____days.   This is a single patch monitor.  [employer] supplies one patch monitor per enrollment.  Additional stickers are not available.   Please do not apply patch if you will be having a Nuclear Stress Test, Echocardiogram, Cardiac CT, MRI, or Chest Xray during the time frame you would be wearing the monitor. The patch cannot be worn during these tests.  You cannot remove and re-apply the ZIO XT patch monitor.   Your ZIO patch monitor will be sent USPS Priority mail from [facility] directly to your home address. The monitor may also be mailed to a PO BOX if home delivery is not available.   It may take 3-5 days to receive your monitor after you have been enrolled.   Once you have received you monitor, please review enclosed instructions.  Your monitor has already been registered assigning a specific monitor serial # to you.   Applying the monitor   Shave hair from upper left chest.   Hold abrader disc by orange tab.  Rub abrader in 40 strokes over left upper chest as indicated in your monitor instructions.   Clean area with 4 enclosed alcohol pads .  Use all pads to assure are is cleaned thoroughly.  Let dry.   Apply patch as indicated in monitor instructions.  Patch will be place under collarbone on left side of chest with arrow pointing upward.   Rub patch adhesive wings for 2 minutes.Remove white label  marked "1".  Remove white label marked "2".  Rub patch adhesive wings for 2 additional minutes.   While looking in a mirror, press and release button in center of patch.  A small green light will flash 3-4 times .  This will be your only indicator the monitor has been turned on.     Do not shower for the first 24 hours.  You may shower after the first 24 hours.   Press button if you feel a symptom. You will hear a small click.  Record Date, Time and Symptom in the Patient Log Book.   When you are ready to remove patch, follow instructions on last 2 pages of Patient Log Book.  Stick patch monitor onto last page of Patient Log Book.   Place Patient Log Book in [place] box.  Use locking tab on box and tape box closed securely.  The [employer] and [employer] has [employer] on it.  Please place in mailbox as soon as possible.  Your physician should have your test results approximately 7 days after the monitor has been mailed back to [facility].   Call [place] at [phone] if you have questions regarding your ZIO XT patch monitor.  Call them immediately if you see an orange light blinking on your monitor.   If your monitor falls off in less than 4 days contact our Monitor department at [phone].  If your monitor becomes loose or falls off after 4 days  call [employer] at [phone] for suggestions on securing your monitor.    Follow-Up: At [facility], you and your health needs are our priority.  As part of our continuing mission to provide you with exceptional heart care, we have created designated Provider Care Teams.  These Care Teams include your primary Cardiologist (physician) and Advanced Practice Providers (APPs -  Physician Assistants and Nurse Practitioners) who all work together to provide you with the care you need, when you need it.   Your next appointment:   8 month(s)   (June, 2022) The format for your next appointment:   In Person  Provider:   You  may see Dorris Carnes, MD or one of the following Advanced Practice Providers on your designated Care Team:    Richardson Dopp, PA-C  Robbie Lis, [place]   Other Instructions

## 2020-04-16 NOTE — Progress Notes (Signed)
Patient ID: Howard Hays, male   DOB: February 22, 1976, 44 y.o.   MRN: [id number] Patient enrolled for [employer] to ship a 3 day ZIO XT long term holter monitor to his home.

## 2020-04-19 ENCOUNTER — Ambulatory Visit (INDEPENDENT_AMBULATORY_CARE_PROVIDER_SITE_OTHER): Payer: No Typology Code available for payment source

## 2020-04-19 DIAGNOSIS — I2510 Atherosclerotic heart disease of native coronary artery without angina pectoris: Secondary | ICD-10-CM

## 2020-04-19 DIAGNOSIS — R002 Palpitations: Secondary | ICD-10-CM

## 2020-05-26 ENCOUNTER — Other Ambulatory Visit: Payer: No Typology Code available for payment source

## 2020-05-26 DIAGNOSIS — Z20822 Contact with and (suspected) exposure to covid-19: Secondary | ICD-10-CM

## 2020-05-28 LAB — SARS-COV-2, NAA 2 DAY TAT

## 2020-05-28 LAB — NOVEL CORONAVIRUS, NAA: SARS-CoV-2, NAA: NOT DETECTED

## 2020-06-11 ENCOUNTER — Other Ambulatory Visit: Payer: Self-pay

## 2020-06-11 ENCOUNTER — Other Ambulatory Visit: Payer: No Typology Code available for payment source | Admitting: *Deleted

## 2020-06-11 DIAGNOSIS — I2510 Atherosclerotic heart disease of native coronary artery without angina pectoris: Secondary | ICD-10-CM

## 2020-06-11 DIAGNOSIS — R002 Palpitations: Secondary | ICD-10-CM

## 2020-06-11 LAB — LIPID PANEL
Chol/HDL Ratio: 4.4 ratio (ref 0.0–5.0)
Cholesterol, Total: 195 mg/dL (ref 100–199)
HDL: 44 mg/dL (ref >39)
LDL Chol Calc (NIH): 132 mg/dL — ABNORMAL HIGH (ref 0–99)
Triglycerides: 105 mg/dL (ref 0–149)
VLDL Cholesterol Cal: 19 mg/dL (ref 5–40)

## 2020-06-14 ENCOUNTER — Other Ambulatory Visit: Payer: Self-pay | Admitting: Internal Medicine

## 2020-06-14 ENCOUNTER — Telehealth: Payer: Self-pay

## 2020-06-14 DIAGNOSIS — E785 Hyperlipidemia, unspecified: Secondary | ICD-10-CM

## 2020-06-14 MED ORDER — ROSUVASTATIN CALCIUM 40 MG PO TABS: 40.0000 mg | ORAL_TABLET | Freq: Every day | ORAL | 3 refills | Status: DC

## 2020-06-14 MED ORDER — EZETIMIBE 10 MG PO TABS: 10.0000 mg | ORAL_TABLET | Freq: Every day | ORAL | 3 refills | Status: DC

## 2020-06-14 MED FILL — ROSUVASTATIN CALCIUM 40 MG: 40 | 90 days supply | Qty: 90 | Fill #0

## 2020-06-14 MED FILL — EZETIMIBE 10 MG TABS: 10 | 90 days supply | Qty: 90 | Fill #0

## 2020-06-14 NOTE — Telephone Encounter (Signed)
Pt aware of lab results and recommendations. Lab appt made. He had no additional questions.

## 2020-06-14 NOTE — Telephone Encounter (Signed)
-----   Message from Fay Records, MD sent at 06/14/2020  7:25 AM EST ----- LDL did not change much with 20 Crestor   WIth plaquing noted the goal is below 90, even closer to 70 I would increase to 40 mg     Add Zetia 10 mg with largest meal Check lipomed panel in 2 months

## 2020-08-13 ENCOUNTER — Encounter: Payer: Self-pay | Admitting: Family Medicine

## 2020-08-21 ENCOUNTER — Other Ambulatory Visit: Payer: No Typology Code available for payment source

## 2020-09-05 ENCOUNTER — Other Ambulatory Visit: Payer: No Typology Code available for payment source | Admitting: *Deleted

## 2020-09-05 ENCOUNTER — Other Ambulatory Visit: Payer: Self-pay

## 2020-09-05 DIAGNOSIS — E785 Hyperlipidemia, unspecified: Secondary | ICD-10-CM

## 2020-09-06 LAB — LIPID PANEL
Chol/HDL Ratio: 3.9 ratio (ref 0.0–5.0)
Cholesterol, Total: 152 mg/dL (ref 100–199)
HDL: 39 mg/dL — ABNORMAL LOW (ref >39)
LDL Chol Calc (NIH): 86 mg/dL (ref 0–99)
Triglycerides: 157 mg/dL — ABNORMAL HIGH (ref 0–149)
VLDL Cholesterol Cal: 27 mg/dL (ref 5–40)

## 2020-10-18 ENCOUNTER — Other Ambulatory Visit: Payer: Self-pay

## 2020-11-05 ENCOUNTER — Other Ambulatory Visit (HOSPITAL_COMMUNITY): Payer: Self-pay

## 2020-11-05 ENCOUNTER — Other Ambulatory Visit: Payer: Self-pay

## 2020-11-05 ENCOUNTER — Ambulatory Visit (INDEPENDENT_AMBULATORY_CARE_PROVIDER_SITE_OTHER): Payer: No Typology Code available for payment source | Admitting: Pharmacist

## 2020-11-05 DIAGNOSIS — E782 Mixed hyperlipidemia: Secondary | ICD-10-CM | POA: Diagnosis not present

## 2020-11-05 MED ORDER — REPATHA SURECLICK 140 MG/ML ~~LOC~~ SOAJ
1.0000 "pen " | SUBCUTANEOUS | 11 refills | Status: DC
  Filled 2020-11-05: qty 2, 28d supply, fill #0
  Filled 2020-12-06: qty 2, 28d supply, fill #1
  Filled 2021-01-03: qty 2, 28d supply, fill #2
  Filled 2021-02-08: qty 2, 28d supply, fill #3
  Filled 2021-03-08: qty 2, 28d supply, fill #4
  Filled 2021-04-10: qty 2, 28d supply, fill #5
  Filled 2021-05-17: qty 2, 28d supply, fill #6
  Filled 2021-06-27: qty 2, 28d supply, fill #7
  Filled 2021-08-26: qty 2, 28d supply, fill #8
  Filled 2021-09-25: qty 2, 28d supply, fill #9
  Filled 2021-10-31: qty 2, 28d supply, fill #10

## 2020-11-05 MED FILL — Ezetimibe Tab 10 MG: ORAL | 90 days supply | Qty: 90 | Fill #0 | Status: AC

## 2020-11-05 MED FILL — Rosuvastatin Calcium Tab 40 MG: ORAL | 90 days supply | Qty: 90 | Fill #0 | Status: AC

## 2020-11-05 MED FILL — Fenofibrate Tab 54 MG: ORAL | 90 days supply | Qty: 90 | Fill #0 | Status: AC

## 2020-11-05 MED FILL — Ezetimibe Tab 10 MG: ORAL | 90 days supply | Qty: 90 | Fill #0 | Status: CN

## 2020-11-05 MED FILL — Metoprolol Succinate Tab ER 24HR 50 MG (Tartrate Equiv): ORAL | 90 days supply | Qty: 90 | Fill #0 | Status: AC

## 2020-11-05 MED FILL — Metformin HCl Tab 500 MG: ORAL | 90 days supply | Qty: 90 | Fill #0 | Status: AC

## 2020-11-05 MED FILL — Rosuvastatin Calcium Tab 40 MG: ORAL | 90 days supply | Qty: 90 | Fill #0 | Status: CN

## 2020-11-05 NOTE — Patient Instructions (Addendum)
Please start taking Repatha one injection every 14 days. Continue rosuvastatin 40mg  daily, ezetimibe 10mg  daily and fenofibrate 54mg  daily.  Work on diet, meal prepping and avoiding processed foods  TIPS for Living a healthier life  SUGAR  Sugar is a huge problem in the modern day diet. Sugar is a HUGE contributor to heart disease, diabetes, high triglyceride levels, fatty liver diease and obesity. Sugar is hidden in almost all packaged foods/beverages. Added sugar is extra sugar that is added beyond what is naturally found. It adds no nutritional benefit to your body and can cause major harm. The American Heart Association recommends limiting added sugars to no more than 25g for women and 36 grams for men per day.  There are many names for sugar maltose, sucrose (names ending in "ose"), high fructose corn syrup, molasses, cane sugar, corn sweetener, raw sugar, syrup, honey or fruit juice concentrate.   One of the best ways to limit your added sugars is to stop drinking sweetened beverages such as soda, sweet tea, fruit juice or fancy coffee's. There is 65g of added sugars in one 20oz bottle of Coke!! That is equal to 6 donuts.   Pay attention and read all nutrition facts labels. Below is an examples of a nutrition facts label. The #1 is showing you the total sugars where the # 2 is showing you the added sugars. This one severing has almost the max amount of added sugars per day!  Watch out for items that say "low fat" or "no added sugar" as these products are typically very high in sugar. The food industry uses these terms to fool you into thinking they are healthy.  For more information on the dangers of sugar watch WHY Sugar is as Bad as Alcohol (Fructose, The Liver Toxin) on YouTube.    EXERCISE  Exercise is good. We've all heard that. In an ideal world, we would all have time and resources to  get plenty of it. When you are active your heart pumps more efficiently and you will feel  better.  Multiple studies show that even walking regularly has benefits that include living a longer life.  The American Heart Association recommends 90-150 minutes per week of exercise (30 minutes  per day most days of the week). You can do this in any increment you wish. Nine or more  10-minute walks count. So does an hour-long exercise class. Break the time apart into what will  work in your life. Some of the best things you can do include walking briskly, jogging, cycling or  swimming laps. Not everyone is ready to "exercise." Sometimes we need to start with just getting active. Here  are some easy ways to be more active throughout the day: Marland Kitchen Take the stairs instead of the elevator . Go for a 10-15 minute walk during your lunch break (find a friend to make it more enjoyable) . When shopping, park at the back of the parking lot . If you take public transportation, get off one stop early and walk the extra distance . Pace around while making phone calls (most of [place] are not attached to phone cords any longer!) Check with your doctor if you aren't sure what your limitations may be. Always remember to drink plenty of water when doing any type of exercise. Don't feel like a failure if you're not getting the 90-150 minutes per week. If you started by being  a couch potato, then just a 10-minute walk each day is a huge  improvement. Start with little  victories and work your way up.   Healthy Eating Tips  When looking to improve your eating habits, whether to lose weight, lower blood pressure or just be healthier, it helps to know what a serving size is.   Grains 1 slice of bread,  bagel,  cup pasta or rice  Vegetables 1 cup fresh or raw vegetables,  cup cooked or canned Fruits 1 piece of medium sized fruit,  cup canned,   Meats/Proteins  cup dried       1 oz meat, 1 egg,  cup cooked beans, nuts or seeds  Dairy        Fats Individual yogurt container, 1 cup (8oz)    1 teaspoon  margarine/butter or vegetable  milk or milk alternative, 1 slice of cheese          oil; 1 tablespoon mayonnaise or salad dressing                  Plan ahead: make a menu of the meals for a week then create a grocery list to go with  that menu. Consider meals that easily stretch into a night of leftovers, such as stews or  casseroles. Or consider making two of your favorite meal and put one in the freezer or fridge for  another night.  When you get home from the grocery store wash and prepare your vegetables and fruits.  Then when you need them they are ready to go.  Tips for going to the grocery store: . Buy store or generic brands . Check the weekly ad from your store on-line or in their in-store flyer . Look at the unit price on the shelf tag to compare/contrast the costs of different items . Buy fruits/vegetables in season . Carrots, bananas and apples are low-cost, naturally healthy items . If meats or frozen vegetables are on sale, buy some extras and put in your freezer . Limit buying prepared or "ready to eat" items, even if they are pre-made salads or fruit snacks . Do not shop when you're hungry . Foods at eye level tend to be more expensive. Look on the high and low shelves for deals. . Consider shopping at the farmer's market for fresh foods in season. . Choose canned tuna or salmon instead of fresh . Avoid the cookie and chip aisles (these are expensive, high in calories and low in  nutritional value). Shop on the outside of the grocery store.  Aim to have one 12 hour fast each day. This means no eating after dinner until breakfast. For example, if you eat dinner around 6 PM then you would not eat anything until 6 AM the next day. This is a great way to help lower your insulin levels, lose weight and reduce your blood pressure.   Healthy food preparations: . If you can't get lean hamburger, be sure to drain the fat when cooking . Steam, saut (in olive oil), grill or bake  foods . Experiment with different seasonings to avoid adding salt to your foods. Kosher salt, sea salt and Himalayan salt are all still salt and should be avoided Try seasoning food with onion, garlic, thyme, rosemary, basil ect. Onion powder or garlic powder is ok. Avoid if it says salt (ie garlic salt).        Resources: American Heart Association - InstantFinish.fi Go to the Healthy Living tab to get more information American Diabetes Association - www.diabetes.org You don't have to be diabetic -  check out the Food and Fitness tab
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
## 2020-11-06 ENCOUNTER — Other Ambulatory Visit (HOSPITAL_COMMUNITY): Payer: Self-pay

## 2020-11-07 ENCOUNTER — Other Ambulatory Visit (HOSPITAL_COMMUNITY): Payer: Self-pay

## 2020-11-07 ENCOUNTER — Telehealth: Payer: Self-pay | Admitting: Pharmacist

## 2020-11-07 NOTE — Telephone Encounter (Signed)
PA SUBMITED

## 2020-11-07 NOTE — Telephone Encounter (Signed)
Received fax from pharmacy that [place] PA is needed, [place] on CMM.

## 2020-11-10 ENCOUNTER — Other Ambulatory Visit (HOSPITAL_COMMUNITY): Payer: Self-pay

## 2020-11-12 ENCOUNTER — Other Ambulatory Visit (HOSPITAL_COMMUNITY): Payer: Self-pay

## 2020-11-12 NOTE — Telephone Encounter (Signed)
Called and spoke w/pt's insurance company who stated that they do not require prior authorization at this time

## 2020-11-12 NOTE — Telephone Encounter (Signed)
Called and spoke w/pt regarding pa no longer needed and we called the pharmacy and they stated that the cost is 498 but the pt has a copay card at home that he stated he will call into them

## 2020-11-13 ENCOUNTER — Other Ambulatory Visit (HOSPITAL_COMMUNITY): Payer: Self-pay

## 2020-11-13 NOTE — Telephone Encounter (Signed)
Called and spoke with patient to confirm he was able to get Repatha. He states there was an issue with cost, but pharmacist was going to fix it. Advised he call me if there are any issues. Labwork already scheduled for 8/2

## 2020-11-14 ENCOUNTER — Encounter: Payer: Self-pay | Admitting: Nurse Practitioner

## 2020-11-14 ENCOUNTER — Ambulatory Visit (INDEPENDENT_AMBULATORY_CARE_PROVIDER_SITE_OTHER): Payer: No Typology Code available for payment source | Admitting: Nurse Practitioner

## 2020-11-14 ENCOUNTER — Other Ambulatory Visit: Payer: Self-pay

## 2020-11-14 ENCOUNTER — Other Ambulatory Visit (HOSPITAL_COMMUNITY): Payer: Self-pay

## 2020-11-14 VITALS — BP 138/80 | HR 65 | Temp 98.2°F | Ht 73.0 in | Wt 306.6 lb

## 2020-11-14 DIAGNOSIS — E119 Type 2 diabetes mellitus without complications: Secondary | ICD-10-CM | POA: Diagnosis not present

## 2020-11-14 DIAGNOSIS — E782 Mixed hyperlipidemia: Secondary | ICD-10-CM

## 2020-11-14 DIAGNOSIS — F410 Panic disorder [episodic paroxysmal anxiety] without agoraphobia: Secondary | ICD-10-CM

## 2020-11-14 DIAGNOSIS — F411 Generalized anxiety disorder: Secondary | ICD-10-CM

## 2020-11-14 DIAGNOSIS — E669 Obesity, unspecified: Secondary | ICD-10-CM

## 2020-11-14 DIAGNOSIS — M79604 Pain in right leg: Secondary | ICD-10-CM

## 2020-11-14 DIAGNOSIS — Z1159 Encounter for screening for other viral diseases: Secondary | ICD-10-CM

## 2020-11-14 MED ORDER — ALPRAZOLAM 0.5 MG PO TABS
0.5000 mg | ORAL_TABLET | Freq: Every day | ORAL | 0 refills | Status: DC | PRN
  Filled 2020-11-14: qty 30, 30d supply, fill #0

## 2020-11-14 NOTE — Patient Instructions (Signed)
Talk space - application for counseling

## 2020-11-14 NOTE — Assessment & Plan Note (Signed)
Chronic.  Maintained on rosuvastatin 40 mg daily.  Starting Repatha this evening per cardiology.  Will defer lipid panel for now as patient is not fasting.  Continue collaboration with cardiology.

## 2020-11-14 NOTE — Assessment & Plan Note (Signed)
Chronic.  A1c checked today.  Continue metformin 500 mg daily before breakfast.  Patient is taking rosuvastatin 40 mg daily and is starting Repatha this evening for cholesterol.  Due for foot exam, eye exam, and urine microalbumin.  Will obtain at future visit.  Follow-up pending lab work.

## 2020-11-14 NOTE — Assessment & Plan Note (Signed)
Acute on chronic.  PHQ-9 1 and GAD-7 5 today.  Previously, alprazolam 0.5 mg works very well to control symptoms.  30 tablets would last him at least a year.  PDMP reviewed and appropriate-refill given.  Discussed benefits of daily SSRI medication to help reduce daily anxiety, however patient does not desire daily medication at this time.  We will be available to patient should he change his mind.
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
## 2020-11-14 NOTE — Assessment & Plan Note (Signed)
Hemoglobin D3F, CBC, metabolic panel for electrolytes, kidney function, and liver enzymes checked today.  Encouraged physical activity-goal is 30 minutes 5 times weekly.  Try to watch carbohydrates in diet.

## 2020-11-16 LAB — CBC WITH DIFFERENTIAL/PLATELET
Absolute Monocytes: 589 cells/uL (ref 200–950)
Basophils Absolute: 43 cells/uL (ref 0–200)
Basophils Relative: 0.6 %
Eosinophils Absolute: 71 cells/uL (ref 15–500)
Eosinophils Relative: 1 %
HCT: 43.5 % (ref 38.5–50.0)
Hemoglobin: 14.1 g/dL (ref 13.2–17.1)
Lymphs Abs: 1704 cells/uL (ref 850–3900)
MCH: 28.3 pg (ref 27.0–33.0)
MCHC: 32.4 g/dL (ref 32.0–36.0)
MCV: 87.2 fL (ref 80.0–100.0)
MPV: 10.3 fL (ref 7.5–12.5)
Monocytes Relative: 8.3 %
Neutro Abs: 4693 cells/uL (ref 1500–7800)
Neutrophils Relative %: 66.1 %
Platelets: 271 10*3/uL (ref 140–400)
RBC: 4.99 10*6/uL (ref 4.20–5.80)
RDW: 13.8 % (ref 11.0–15.0)
Total Lymphocyte: 24 %
WBC: 7.1 10*3/uL (ref 3.8–10.8)

## 2020-11-16 LAB — TEST AUTHORIZATION

## 2020-11-16 LAB — HEPATITIS C ANTIBODY
Hepatitis C Ab: NONREACTIVE
SIGNAL TO CUT-OFF: 0.02 (ref <1.00)

## 2020-11-16 LAB — LIPID PANEL
Cholesterol: 141 mg/dL (ref <200)
HDL: 38 mg/dL — ABNORMAL LOW (ref >=40)
LDL Cholesterol (Calc): 63 mg/dL (calc)
Non-HDL Cholesterol (Calc): 103 mg/dL (calc) (ref <130)
Total CHOL/HDL Ratio: 3.7 (calc) (ref <5.0)
Triglycerides: 333 mg/dL — ABNORMAL HIGH (ref <150)

## 2020-11-16 LAB — BASIC METABOLIC PANEL WITH GFR
BUN/Creatinine Ratio: 10 (calc) (ref 6–22)
BUN: 15 mg/dL (ref 7–25)
CO2: 26 mmol/L (ref 20–32)
Calcium: 9.8 mg/dL (ref 8.6–10.3)
Chloride: 104 mmol/L (ref 98–110)
Creat: 1.57 mg/dL — ABNORMAL HIGH (ref 0.60–1.35)
GFR, Est African American: 61 mL/min/{1.73_m2} (ref >=60)
GFR, Est Non African American: 52 mL/min/{1.73_m2} — ABNORMAL LOW (ref >=60)
Glucose, Bld: 99 mg/dL (ref 65–99)
Potassium: 4.4 mmol/L (ref 3.5–5.3)
Sodium: 140 mmol/L (ref 135–146)

## 2020-11-16 LAB — HEMOGLOBIN A1C W/OUT EAG: Hgb A1c MFr Bld: 6.4 % of total Hgb — ABNORMAL HIGH (ref <5.7)

## 2020-11-20 ENCOUNTER — Encounter: Payer: Self-pay | Admitting: Nurse Practitioner

## 2020-11-20 DIAGNOSIS — I83811 Varicose veins of right lower extremities with pain: Secondary | ICD-10-CM

## 2020-11-20 DIAGNOSIS — M79604 Pain in right leg: Secondary | ICD-10-CM

## 2020-12-05 ENCOUNTER — Other Ambulatory Visit: Payer: Self-pay

## 2020-12-06 ENCOUNTER — Ambulatory Visit (INDEPENDENT_AMBULATORY_CARE_PROVIDER_SITE_OTHER): Payer: No Typology Code available for payment source | Admitting: Internal Medicine

## 2020-12-06 ENCOUNTER — Other Ambulatory Visit (HOSPITAL_COMMUNITY): Payer: Self-pay

## 2020-12-06 ENCOUNTER — Encounter: Payer: Self-pay | Admitting: Internal Medicine

## 2020-12-06 VITALS — BP 120/82 | HR 81 | Temp 98.3°F | Ht 73.0 in | Wt 304.0 lb

## 2020-12-06 DIAGNOSIS — I152 Hypertension secondary to endocrine disorders: Secondary | ICD-10-CM

## 2020-12-06 DIAGNOSIS — Z23 Encounter for immunization: Secondary | ICD-10-CM | POA: Diagnosis not present

## 2020-12-06 DIAGNOSIS — Z1211 Encounter for screening for malignant neoplasm of colon: Secondary | ICD-10-CM | POA: Diagnosis not present

## 2020-12-06 DIAGNOSIS — E1169 Type 2 diabetes mellitus with other specified complication: Secondary | ICD-10-CM

## 2020-12-06 DIAGNOSIS — E1159 Type 2 diabetes mellitus with other circulatory complications: Secondary | ICD-10-CM

## 2020-12-06 DIAGNOSIS — G4733 Obstructive sleep apnea (adult) (pediatric): Secondary | ICD-10-CM

## 2020-12-06 DIAGNOSIS — E782 Mixed hyperlipidemia: Secondary | ICD-10-CM | POA: Diagnosis not present

## 2020-12-06 DIAGNOSIS — N1831 Chronic kidney disease, stage 3a: Secondary | ICD-10-CM

## 2020-12-06 MED ORDER — OZEMPIC (0.25 OR 0.5 MG/DOSE) 2 MG/1.5ML ~~LOC~~ SOPN
0.2500 mg | PEN_INJECTOR | SUBCUTANEOUS | 1 refills | Status: DC
  Filled 2020-12-06: qty 1.5, 56d supply, fill #0
  Filled 2021-01-01 – 2021-01-03 (×2): qty 1.5, 56d supply, fill #1
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
## 2020-12-06 NOTE — Patient Instructions (Signed)
-  Nice seeing you today!!  -Start Ozempic 0.25 mg injected weekly.  -Schedule follow up in 3 months.

## 2020-12-11 ENCOUNTER — Encounter: Payer: Self-pay | Admitting: Internal Medicine

## 2020-12-12 ENCOUNTER — Other Ambulatory Visit (HOSPITAL_COMMUNITY): Payer: Self-pay

## 2020-12-17 ENCOUNTER — Encounter: Payer: Self-pay | Admitting: Gastroenterology

## 2020-12-18 ENCOUNTER — Other Ambulatory Visit: Payer: No Typology Code available for payment source

## 2020-12-31 ENCOUNTER — Other Ambulatory Visit: Payer: Self-pay

## 2021-01-01 ENCOUNTER — Encounter: Payer: Self-pay | Admitting: Family Medicine

## 2021-01-01 ENCOUNTER — Ambulatory Visit (INDEPENDENT_AMBULATORY_CARE_PROVIDER_SITE_OTHER): Payer: No Typology Code available for payment source | Admitting: Family Medicine

## 2021-01-01 ENCOUNTER — Other Ambulatory Visit (HOSPITAL_COMMUNITY): Payer: Self-pay

## 2021-01-01 ENCOUNTER — Encounter: Payer: Self-pay | Admitting: Internal Medicine

## 2021-01-01 VITALS — BP 130/80 | HR 80 | Temp 98.1°F | Wt 303.2 lb

## 2021-01-01 DIAGNOSIS — N529 Male erectile dysfunction, unspecified: Secondary | ICD-10-CM

## 2021-01-01 DIAGNOSIS — M25561 Pain in right knee: Secondary | ICD-10-CM

## 2021-01-01 MED ORDER — SILDENAFIL CITRATE 100 MG PO TABS
50.0000 mg | ORAL_TABLET | Freq: Every day | ORAL | 11 refills | Status: AC | PRN
  Filled 2021-01-01: qty 6, 30d supply, fill #0
  Filled 2021-04-10: qty 6, 30d supply, fill #1
  Filled 2021-06-27: qty 6, 30d supply, fill #2

## 2021-01-01 NOTE — Patient Instructions (Signed)
Avoid squatting  I am setting up sports medicine referral.
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
## 2021-01-02 ENCOUNTER — Encounter: Payer: Self-pay | Admitting: Internal Medicine

## 2021-01-03 ENCOUNTER — Other Ambulatory Visit (HOSPITAL_COMMUNITY): Payer: Self-pay

## 2021-01-07 NOTE — Progress Notes (Signed)
I, Peterson Lombard, LAT, ATC acting as a scribe for Lynne Leader, MD.  Subjective:    I'm seeing this patient as a consultation for: Dr. Carolann Littler. Note will be routed back to referring provider/PCP.  CC: Right knee pain  HPI: Pt is a 45 y/o male c/o R knee pain ongoing for about 2 months worsening over the weekend (7/16). MOI: 2 months ago, pt was playing w/ his toddler daughter on the floor and felt a sudden pain in his R knee. Pt reports he was doing some work around the house this past weekend, involving squatting, and knee pain was exacerbated. Pt locates pain to anterior-medial and posterior aspects. Pt notes knee is feeling better than last week.  He would like to avoid injection if possible.  R knee swelling: yes Radiates: no R knee mechanical symptoms: no- a feeling that it "wants to" Weakness: occasionally- feeling of "giving way" Aggravates: walking, standing, activity Treatments tried: IBU, elastic knee sleeve- worsened   Past medical history, Surgical history, Family history, Social history, Allergies, and medications have been entered into the medical record, reviewed.   Review of Systems: No new headache, visual changes, nausea, vomiting, diarrhea, constipation, dizziness, abdominal pain, skin rash, fevers, chills, night sweats, weight loss, swollen lymph nodes, body aches, joint swelling, muscle aches, chest pain, shortness of breath, mood changes, visual or auditory hallucinations.   Objective:    Vitals:   01/08/21 0847  BP: 138/88  Pulse: 80  SpO2: 95%   General: Well Developed, well nourished, and in no acute distress.  Neuro/Psych: Alert and oriented x3, extra-ocular muscles intact, able to move all 4 extremities, sensation grossly intact. Skin: Warm and dry, no rashes noted.  Respiratory: Not using accessory muscles, speaking in full sentences, trachea midline.  Cardiovascular: Pulses palpable, no extremity edema. Abdomen: Does not appear  distended. MSK: Right knee mild effusion otherwise normal. Normal motion. Tender palpation medial joint line. Stable ligamentous exam. Positive medial McMurray's test. Intact strength. Pulses capillary refill and sensation are intact distally.   Lab and Radiology Results  X-ray images right knee obtained today personally and independently interpreted Mild medial compartment DJD.  Accessory ossicle at distal patella at insertion onto tibia.  No acute fractures. Await formal radiology review  Diagnostic Limited MSK Ultrasound of: Right knee  Quad tendon intact. Small joint effusion. Patellar tendon normal appearing until insertion onto tibia which reveals some hyperechoic change consistent with prior history of Osgood slaughters. Medial joint line slightly narrowed and without clear meniscus tear. Lateral joint line normal-appearing Posterior knee no Baker's cyst. Impression: Mild effusion and mild medial DJD.  Impression and Recommendations:    Assessment and Plan: 45 y.o. male with right knee pain thought to be due to probable degenerative medial meniscus tear.  Discussed options.  Patient would like to avoid steroid injection.  Plan for trial of Voltaren gel/Pennsaid.  Additionally refer to physical therapy.  Recheck in 6 weeks.  If not improved consider injection versus MRI.  PDMP not reviewed this encounter. Orders Placed This Encounter  Procedures   [place] LIMITED JOINT SPACE STRUCTURES LOW RIGHT(NO LINKED CHARGES)    Standing Status:   Future    Number of Occurrences:   1    Standing Expiration Date:   07/11/2021    Order Specific Question:   Reason for Exam (SYMPTOM  OR DIAGNOSIS REQUIRED)    Answer:   right knee pain    Order Specific Question:   Preferred imaging location?  Answer:   [facility]   DG Knee AP/LAT W/Sunrise Right    Standing Status:   Future    Number of Occurrences:   1    Standing Expiration Date:   01/08/2022    Order  Specific Question:   Reason for Exam (SYMPTOM  OR DIAGNOSIS REQUIRED)    Answer:   eval knee pain medial    Order Specific Question:   Preferred imaging location?    Answer:   [facility]   Ambulatory referral to Physical Therapy    Referral Priority:   Routine    Referral Type:   Physical Medicine    Referral Reason:   Specialty Services Required    Requested Specialty:   Physical Therapy    Number of Visits Requested:   1   Meds ordered this encounter  Medications   DISCONTD: Diclofenac Sodium (PENNSAID) 2 % SOLN    Sig: Apply 1 Pump topically in the morning and at bedtime.    Dispense:  112 g    Refill:  1   Diclofenac Sodium (PENNSAID) 2 % SOLN    Sig: Apply 1 Pump topically in the morning and at bedtime.    Dispense:  112 g    Refill:  1    Discussed warning signs or symptoms. Please see discharge instructions. Patient expresses understanding.   The above documentation has been reviewed and is accurate and complete Lynne Leader, M.D.

## 2021-01-08 ENCOUNTER — Ambulatory Visit (INDEPENDENT_AMBULATORY_CARE_PROVIDER_SITE_OTHER): Payer: No Typology Code available for payment source

## 2021-01-08 ENCOUNTER — Ambulatory Visit: Payer: Self-pay

## 2021-01-08 ENCOUNTER — Other Ambulatory Visit (HOSPITAL_COMMUNITY): Payer: Self-pay

## 2021-01-08 ENCOUNTER — Encounter: Payer: Self-pay | Admitting: Internal Medicine

## 2021-01-08 ENCOUNTER — Ambulatory Visit (INDEPENDENT_AMBULATORY_CARE_PROVIDER_SITE_OTHER): Payer: No Typology Code available for payment source | Admitting: Family Medicine

## 2021-01-08 ENCOUNTER — Other Ambulatory Visit: Payer: Self-pay

## 2021-01-08 VITALS — BP 138/88 | HR 80 | Ht 73.0 in | Wt 296.8 lb

## 2021-01-08 DIAGNOSIS — E1169 Type 2 diabetes mellitus with other specified complication: Secondary | ICD-10-CM

## 2021-01-08 DIAGNOSIS — M25561 Pain in right knee: Secondary | ICD-10-CM | POA: Diagnosis not present

## 2021-01-08 MED ORDER — DICLOFENAC SODIUM 2 % EX SOLN: 1.0000 | Freq: Two times a day (BID) | CUTANEOUS | 1 refills | Status: DC

## 2021-01-08 MED ORDER — OZEMPIC (0.25 OR 0.5 MG/DOSE) 2 MG/1.5ML ~~LOC~~ SOPN
0.5000 mg | PEN_INJECTOR | SUBCUTANEOUS | 0 refills | Status: DC
  Filled 2021-01-08: qty 4.5, 84d supply, fill #0

## 2021-01-08 MED ORDER — DICLOFENAC SODIUM 2 % EX SOLN
1.0000 | Freq: Two times a day (BID) | CUTANEOUS | 1 refills | Status: DC
Start: 2021-01-08 — End: 2021-01-08

## 2021-01-08 NOTE — Patient Instructions (Signed)
Thank you for coming in today.   Please get an Xray today before you leave   I've referred you to Physical Therapy.  Let us know if you don't hear from them in one week.   Please use Voltaren gel (Generic Diclofenac Gel) up to 4x daily for pain as needed.  This is available over-the-counter as both the name brand Voltaren gel and the generic diclofenac gel.   Pensiad is a prescription version of voltaren gel that works better but its more annoying to get.   If not improving next step is either MRI or injection.   Let me know.   Recheck in something like 6-8 weeks.   If you know that you want an MRI just tell me and we can do that before you come back.

## 2021-01-09 ENCOUNTER — Ambulatory Visit: Payer: No Typology Code available for payment source | Admitting: Internal Medicine

## 2021-01-09 ENCOUNTER — Telehealth: Payer: Self-pay | Admitting: Family Medicine

## 2021-01-09 DIAGNOSIS — M25561 Pain in right knee: Secondary | ICD-10-CM

## 2021-01-09 DIAGNOSIS — S82014A Nondisplaced osteochondral fracture of right patella, initial encounter for closed fracture: Secondary | ICD-10-CM

## 2021-01-09 NOTE — Telephone Encounter (Signed)
MRI ordered

## 2021-01-09 NOTE — Progress Notes (Signed)
X-ray right knee shows some concern that you may have actually broken the underside of the kneecap.  I do not think this is probably the case but we cannot tell for sure based on the x-ray.  Radiology wants [place] to get an MRI which would be much more accurate.  I think this is a good idea.  I have ordered an MRI.  You should hear soon about scheduling.

## 2021-01-18 ENCOUNTER — Ambulatory Visit (AMBULATORY_SURGERY_CENTER): Payer: No Typology Code available for payment source | Admitting: *Deleted

## 2021-01-18 ENCOUNTER — Other Ambulatory Visit: Payer: Self-pay

## 2021-01-18 ENCOUNTER — Other Ambulatory Visit (HOSPITAL_COMMUNITY): Payer: Self-pay

## 2021-01-18 VITALS — Ht 73.0 in | Wt 297.0 lb

## 2021-01-18 DIAGNOSIS — Z1211 Encounter for screening for malignant neoplasm of colon: Secondary | ICD-10-CM

## 2021-01-18 MED ORDER — NA SULFATE-K SULFATE-MG SULF 17.5-3.13-1.6 GM/177ML PO SOLN
1.0000 | Freq: Once | ORAL | 0 refills | Status: AC
  Filled 2021-01-18: qty 354, 1d supply, fill #0

## 2021-01-18 NOTE — Progress Notes (Signed)
Pt's previsit is done over the phone and all paperwork (prep instructions, blank consent form to just read over) sent to patient.  Pt's name and DOB verified at the beginning of the previsit.  Pt denies any difficulty with ambulating.    No trouble with anesthesia, denies trouble moving neck, or hx/fam hx of malignant hyperthermia per pt   No egg or soy allergy  No home oxygen use   No medications for weight loss taken  emmi information given  Pt denies constipation issues  Pt informed that we do not do prior authorizations for prep

## 2021-01-21 ENCOUNTER — Telehealth: Payer: Self-pay

## 2021-01-21 LAB — HM DIABETES EYE EXAM

## 2021-01-21 NOTE — Telephone Encounter (Signed)
Patient is calling in wanting to do a TOC from [place] to [facility]. Okay for TOC.?

## 2021-01-22 ENCOUNTER — Other Ambulatory Visit: Payer: Self-pay

## 2021-01-22 ENCOUNTER — Other Ambulatory Visit: Payer: No Typology Code available for payment source | Admitting: *Deleted

## 2021-01-22 DIAGNOSIS — E782 Mixed hyperlipidemia: Secondary | ICD-10-CM

## 2021-01-22 LAB — LIPID PANEL
Chol/HDL Ratio: 2 ratio (ref 0.0–5.0)
Cholesterol, Total: 67 mg/dL — ABNORMAL LOW (ref 100–199)
HDL: 34 mg/dL — ABNORMAL LOW (ref >39)
LDL Chol Calc (NIH): 15 mg/dL (ref 0–99)
Triglycerides: 87 mg/dL (ref 0–149)
VLDL Cholesterol Cal: 18 mg/dL (ref 5–40)

## 2021-01-24 LAB — HM DIABETES EYE EXAM

## 2021-01-27 ENCOUNTER — Ambulatory Visit
Admission: RE | Admit: 2021-01-27 | Discharge: 2021-01-27 | Disposition: A | Discharge status: Home / Self Care | Payer: No Typology Code available for payment source | Source: Ambulatory Visit | Attending: Family Medicine | Admitting: Family Medicine

## 2021-01-27 ENCOUNTER — Other Ambulatory Visit: Payer: Self-pay

## 2021-01-27 DIAGNOSIS — M25561 Pain in right knee: Secondary | ICD-10-CM

## 2021-01-27 DIAGNOSIS — S82014A Nondisplaced osteochondral fracture of right patella, initial encounter for closed fracture: Secondary | ICD-10-CM

## 2021-01-29 NOTE — Progress Notes (Signed)
MRI shows some diffuse cartilage thinning consistent with arthritis..  There is a possible meniscus tear although its not entirely clear on MRI.  And there is some irritation at the front of the knee at Hoffa's fat pad where the patellar tendon could pinch the fat pad in the front of the knee and cause pain.  Very likely the next step treatment option will be injection.  Recommend return to clinic in the near future certainly I can get you in before your scheduled visit on 30 August.

## 2021-02-01 ENCOUNTER — Encounter: Payer: Self-pay | Admitting: Internal Medicine

## 2021-02-05 ENCOUNTER — Ambulatory Visit (AMBULATORY_SURGERY_CENTER): Payer: No Typology Code available for payment source | Admitting: Gastroenterology

## 2021-02-05 ENCOUNTER — Encounter: Payer: Self-pay | Admitting: Gastroenterology

## 2021-02-05 VITALS — BP 139/86 | HR 72 | Temp 97.8°F | Resp 19 | Ht 73.0 in | Wt 297.0 lb

## 2021-02-05 DIAGNOSIS — K621 Rectal polyp: Secondary | ICD-10-CM

## 2021-02-05 DIAGNOSIS — Z1211 Encounter for screening for malignant neoplasm of colon: Secondary | ICD-10-CM | POA: Diagnosis not present

## 2021-02-05 DIAGNOSIS — D128 Benign neoplasm of rectum: Secondary | ICD-10-CM

## 2021-02-05 MED ORDER — SODIUM CHLORIDE 0.9 % IV SOLN: 500.0000 mL | Freq: Once | INTRAVENOUS | Status: DC

## 2021-02-05 MED ORDER — CHOLESTYRAMINE 4 G PO PACK: 4.0000 g | PACK | Freq: Two times a day (BID) | ORAL | 12 refills | Status: DC

## 2021-02-05 NOTE — Patient Instructions (Signed)
Handouts given:  diverticulosis, Polyps, high fiber diet Start a higher fiber diet Use Fibercon 1-2 tab by mouth daily Continue current medications Await pathology results  YOU HAD AN ENDOSCOPIC PROCEDURE TODAY AT [facility]:   Refer to the procedure report that was given to you for any specific questions about what was found during the examination.  If the procedure report does not answer your questions, please call your gastroenterologist to clarify.  If you requested that your care partner not be given the details of your procedure findings, then the procedure report has been included in a sealed envelope for you to review at your convenience later.  YOU SHOULD EXPECT: Some feelings of bloating in the abdomen. Passage of more gas than usual.  Walking can help get rid of the air that was put into your GI tract during the procedure and reduce the bloating. If you had a lower endoscopy (such as a colonoscopy or flexible sigmoidoscopy) you may notice spotting of blood in your stool or on the toilet paper. If you underwent a bowel prep for your procedure, you may not have a normal bowel movement for a few days.  Please Note:  You might notice some irritation and congestion in your nose or some drainage.  This is from the oxygen used during your procedure.  There is no need for concern and it should clear up in a day or so.  SYMPTOMS TO REPORT IMMEDIATELY:  Following lower endoscopy (colonoscopy or flexible sigmoidoscopy):  Excessive amounts of blood in the stool  Significant tenderness or worsening of abdominal pains  Swelling of the abdomen that is new, acute  Fever of 100F or higher  For urgent or emergent issues, a gastroenterologist can be reached at any hour by calling [phone]. Do not use MyChart messaging for urgent concerns.   DIET:  We do recommend a small meal at first, but then you may proceed to your regular diet.  Drink plenty of fluids but you should avoid  alcoholic beverages for 24 hours.  ACTIVITY:  You should plan to take it easy for the rest of today and you should NOT DRIVE or use heavy machinery until tomorrow (because of the sedation medicines used during the test).    FOLLOW UP: Our staff will call the number listed on your records 48-72 hours following your procedure to check on you and address any questions or concerns that you may have regarding the information given to you following your procedure. If we do not reach you, we will leave a message.  We will attempt to reach you two times.  During this call, we will ask if you have developed any symptoms of COVID 19. If you develop any symptoms (ie: fever, flu-like symptoms, shortness of breath, cough etc.) before then, please call [phone].  If you test positive for Covid 19 in the 2 weeks post procedure, please call and report this information to [place].    If any biopsies were taken you will be contacted by phone or by letter within the next 1-3 weeks.  Please call us at [phone] if you have not heard about the biopsies in 3 weeks.   SIGNATURES/CONFIDENTIALITY: You and/or your care partner have signed paperwork which will be entered into your electronic medical record.  These signatures attest to the fact that that the information above on your After Visit Summary has been reviewed and is understood.  Full responsibility of the confidentiality of this discharge information lies  with you and/or your care-partner.  

## 2021-02-05 NOTE — Progress Notes (Signed)
pt tolerated well. VSS. awake and to recovery. Report given to RN.  
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
## 2021-02-05 NOTE — Op Note (Signed)
[place] Patient Name: Howard Hays Procedure Date: 02/05/2021 2:52 PM MRN: [id number] Endoscopist: Justice Britain , MD Age: 45 Referring MD:  Date of Birth: 1976/02/22 Gender: Male Account #: [id number] Procedure:                Colonoscopy Indications:              Screening for colorectal malignant neoplasm, This                            is the patient's first colonoscopy Medicines:                Monitored Anesthesia Care Procedure:                Pre-Anesthesia Assessment:                           - Prior to the procedure, a History and Physical                            was performed, and patient medications and                            allergies were reviewed. The patient's tolerance of                            previous anesthesia was also reviewed. The risks                            and benefits of the procedure and the sedation                            options and risks were discussed with the patient.                            All questions were answered, and informed consent                            was obtained. Prior Anticoagulants: The patient has                            taken no previous anticoagulant or antiplatelet                            agents except for aspirin. ASA Grade Assessment:                            III - A patient with severe systemic disease. After                            reviewing the risks and benefits, the patient was                            deemed in satisfactory condition to undergo the  procedure.                           After obtaining informed consent, the colonoscope                            was passed under direct vision. Throughout the                            procedure, the patient's blood pressure, pulse, and                            oxygen saturations were monitored continuously. The                            Olympus CF-HQ190L (Serial# [id number]) Colonoscope was                             introduced through the anus and advanced to the 5                            cm into the ileum. The colonoscopy was performed                            without difficulty. The patient tolerated the                            procedure. The quality of the bowel preparation was                            good. The terminal ileum, ileocecal valve,                            appendiceal orifice, and rectum were photographed. Scope In: 3:05:41 PM Scope Out: 3:22:05 PM Scope Withdrawal Time: 0 hours 12 minutes 39 seconds  Total Procedure Duration: 0 hours 16 minutes 24 seconds  Findings:                 The digital rectal exam findings include                            hemorrhoids. Pertinent negatives include no                            palpable rectal lesions.                           The terminal ileum and ileocecal valve appeared                            normal.                           Four sessile polyps were found in the rectum. The  polyps were 2 to 3 mm in size. These polyps were                            removed with a cold snare. Resection and retrieval                            were complete.                           A few small-mouthed diverticula were found in the                            recto-sigmoid colon and sigmoid colon.                           Normal mucosa was found in the entire colon                            otherwise.                           Non-bleeding non-thrombosed internal hemorrhoids                            were found during retroflexion, during perianal                            exam and during digital exam. The hemorrhoids were                            Grade II (internal hemorrhoids that prolapse but                            reduce spontaneously). Complications:            No immediate complications. Estimated Blood Loss:     Estimated blood loss was minimal. Impression:                - Hemorrhoids found on digital rectal exam.                           - The examined portion of the ileum was normal.                           - Four 2 to 3 mm polyps in the rectum, removed with                            a cold snare. Resected and retrieved.                           - Diverticulosis in the recto-sigmoid colon and in                            the sigmoid colon.                           -  Normal mucosa in the entire examined colon                            otherwise.                           - Non-bleeding non-thrombosed internal hemorrhoids. Recommendation:           - The patient will be observed post-procedure,                            until all discharge criteria are met.                           - Discharge patient to home.                           - Patient has a contact number available for                            emergencies. The signs and symptoms of potential                            delayed complications were discussed with the                            patient. Return to normal activities tomorrow.                            Written discharge instructions were provided to the                            patient.                           - High fiber diet.                           - Use FiberCon 1-2 tablets PO daily.                           - Continue present medications.                           - Await pathology results.                           - Repeat colonoscopy in 3/10/27/08 years for                            surveillance based on pathology results and based                            on adenomatous tissue findings.                           - The findings and recommendations were discussed  with the patient.                           - The findings and recommendations were discussed                            with the patient's family. Justice Britain, MD 02/05/2021 3:26:21 PM

## 2021-02-05 NOTE — Progress Notes (Signed)
Pt's states no medical or surgical changes since previsit or office visit.    Pt cbg is 78, pt asymptomatic, D5W started at kvo rate will check cbg again in 15 mins  1444- CBG is 92, pt remains asymptomatic, NS infusing, D5W stopped.         Vitals CW

## 2021-02-05 NOTE — Progress Notes (Signed)
Called to room to assist during endoscopic procedure.  Patient ID and intended procedure confirmed with present staff. Received instructions for my participation in the procedure from the performing physician.  

## 2021-02-07 ENCOUNTER — Telehealth: Payer: Self-pay | Admitting: *Deleted

## 2021-02-07 ENCOUNTER — Telehealth: Payer: Self-pay

## 2021-02-07 NOTE — Telephone Encounter (Signed)
Attempted f/u phone call. No answer. Left message. °

## 2021-02-07 NOTE — Telephone Encounter (Signed)
Second attempt follow up call to pt, no answer.  

## 2021-02-08 ENCOUNTER — Other Ambulatory Visit (HOSPITAL_COMMUNITY): Payer: Self-pay

## 2021-02-08 ENCOUNTER — Other Ambulatory Visit: Payer: Self-pay

## 2021-02-08 NOTE — Telephone Encounter (Signed)
I called and spoke with the patient this afternoon.  There was some need for clarification. In his discharge paperwork he was instructed to take cholestyramine as well as FiberCon. Patient and I discussed that he is not having any issues with diarrhea and he actually still has his gallbladder. And such a manner the patient would not meet criteria or have any needs for cholestyramine. We will have that medication removed. He can continue to initiate FiberCon or fiber supplement in the setting of his known hemorrhoids to try and minimize risk of issues down the road. He was appreciative for the call back. His pathology has not returned but we will update him when it does. I will have my team work on updating the chart.  Justice Britain, MD [facility] Office # [phone]

## 2021-02-08 NOTE — Telephone Encounter (Signed)
I called and spoke with the patient this afternoon.  There was some need for clarification. In his discharge paperwork he was instructed to take cholestyramine as well as FiberCon. Patient and I discussed that he is not having any issues with diarrhea and he actually still has his gallbladder. And such a manner the patient would not meet criteria or have any needs for cholestyramine. We will have that medication removed. He can continue to initiate FiberCon or fiber supplement in the setting of his known hemorrhoids to try and minimize risk of issues down the road. He was appreciative for the call back. His pathology has not returned but we will update him when it does. I will have my team work on updating the chart.  Justice Britain, MD [facility] # [phone]

## 2021-02-10 ENCOUNTER — Encounter: Payer: Self-pay | Admitting: Gastroenterology

## 2021-02-12 ENCOUNTER — Ambulatory Visit: Payer: No Typology Code available for payment source | Admitting: Physical Therapy

## 2021-02-12 ENCOUNTER — Ambulatory Visit: Payer: No Typology Code available for payment source | Admitting: Family Medicine

## 2021-02-15 ENCOUNTER — Ambulatory Visit: Payer: No Typology Code available for payment source | Admitting: Internal Medicine

## 2021-02-19 ENCOUNTER — Ambulatory Visit: Payer: No Typology Code available for payment source | Admitting: Family Medicine

## 2021-03-08 ENCOUNTER — Ambulatory Visit: Payer: No Typology Code available for payment source | Admitting: Internal Medicine

## 2021-03-08 ENCOUNTER — Other Ambulatory Visit (HOSPITAL_COMMUNITY): Payer: Self-pay

## 2021-03-08 ENCOUNTER — Other Ambulatory Visit: Payer: Self-pay

## 2021-03-08 MED FILL — Metoprolol Succinate Tab ER 24HR 50 MG (Tartrate Equiv): ORAL | 90 days supply | Qty: 90 | Fill #1 | Status: AC

## 2021-03-08 MED FILL — Rosuvastatin Calcium Tab 40 MG: ORAL | 90 days supply | Qty: 90 | Fill #1 | Status: AC

## 2021-03-08 MED FILL — Ezetimibe Tab 10 MG: ORAL | 90 days supply | Qty: 90 | Fill #1 | Status: AC

## 2021-03-08 MED FILL — Metformin HCl Tab 500 MG: ORAL | 90 days supply | Qty: 90 | Fill #1 | Status: AC

## 2021-03-11 ENCOUNTER — Other Ambulatory Visit (HOSPITAL_COMMUNITY): Payer: Self-pay

## 2021-03-12 ENCOUNTER — Ambulatory Visit (INDEPENDENT_AMBULATORY_CARE_PROVIDER_SITE_OTHER): Payer: No Typology Code available for payment source | Admitting: Internal Medicine

## 2021-03-12 ENCOUNTER — Other Ambulatory Visit (HOSPITAL_COMMUNITY): Payer: Self-pay

## 2021-03-12 ENCOUNTER — Other Ambulatory Visit: Payer: Self-pay

## 2021-03-12 ENCOUNTER — Encounter: Payer: Self-pay | Admitting: Internal Medicine

## 2021-03-12 VITALS — BP 110/80 | HR 92 | Temp 98.5°F | Wt 288.3 lb

## 2021-03-12 DIAGNOSIS — Z23 Encounter for immunization: Secondary | ICD-10-CM

## 2021-03-12 DIAGNOSIS — E782 Mixed hyperlipidemia: Secondary | ICD-10-CM

## 2021-03-12 DIAGNOSIS — F411 Generalized anxiety disorder: Secondary | ICD-10-CM

## 2021-03-12 DIAGNOSIS — E1169 Type 2 diabetes mellitus with other specified complication: Secondary | ICD-10-CM | POA: Diagnosis not present

## 2021-03-12 DIAGNOSIS — I152 Hypertension secondary to endocrine disorders: Secondary | ICD-10-CM

## 2021-03-12 DIAGNOSIS — F410 Panic disorder [episodic paroxysmal anxiety] without agoraphobia: Secondary | ICD-10-CM

## 2021-03-12 DIAGNOSIS — G4733 Obstructive sleep apnea (adult) (pediatric): Secondary | ICD-10-CM

## 2021-03-12 DIAGNOSIS — E1159 Type 2 diabetes mellitus with other circulatory complications: Secondary | ICD-10-CM

## 2021-03-12 LAB — POCT GLYCOSYLATED HEMOGLOBIN (HGB A1C): Hemoglobin A1C: 5.8 % — AB (ref 4.0–5.6)

## 2021-03-12 LAB — MICROALBUMIN / CREATININE URINE RATIO
Creatinine,U: 242.3 mg/dL
Microalb Creat Ratio: 0.3 mg/g (ref 0.0–30.0)
Microalb, Ur: <0.7 mg/dL (ref 0.0–1.9)

## 2021-03-12 MED ORDER — SEMAGLUTIDE (1 MG/DOSE) 4 MG/3ML ~~LOC~~ SOPN
1.0000 mg | PEN_INJECTOR | SUBCUTANEOUS | 0 refills | Status: AC
  Filled 2021-03-12 (×2): qty 9, 84d supply, fill #0

## 2021-03-12 NOTE — Patient Instructions (Signed)
-  Nice seeing you today!!  -Schedule follow up in 3 months. 
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
## 2021-03-12 NOTE — Addendum Note (Signed)
Addended by: Amanda Cockayne on: 03/12/2021 02:44 PM   Modules accepted: Orders

## 2021-03-13 ENCOUNTER — Other Ambulatory Visit (HOSPITAL_COMMUNITY): Payer: Self-pay

## 2021-03-13 MED ORDER — FENOFIBRATE 54 MG PO TABS
ORAL_TABLET | Freq: Every day | ORAL | 1 refills | Status: DC
  Filled 2021-03-13: qty 90, 90d supply, fill #0
  Filled 2021-06-27: qty 90, 90d supply, fill #1

## 2021-03-17 ENCOUNTER — Encounter: Payer: Self-pay | Admitting: Internal Medicine

## 2021-03-18 NOTE — Telephone Encounter (Signed)
Please advise.  I'm not sure what he means by there is no record of his Xanax. There are quite a few progress notes that list this medication ranging from 2017 up until 03/12/2021. Also medication was last filled by another provider.
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
## 2021-04-08 ENCOUNTER — Ambulatory Visit: Payer: No Typology Code available for payment source | Admitting: Family Medicine

## 2021-04-08 ENCOUNTER — Ambulatory Visit: Payer: No Typology Code available for payment source | Admitting: Internal Medicine

## 2021-04-10 ENCOUNTER — Other Ambulatory Visit: Payer: Self-pay | Admitting: Nurse Practitioner

## 2021-04-10 DIAGNOSIS — F411 Generalized anxiety disorder: Secondary | ICD-10-CM

## 2021-04-10 DIAGNOSIS — F410 Panic disorder [episodic paroxysmal anxiety] without agoraphobia: Secondary | ICD-10-CM

## 2021-04-11 ENCOUNTER — Other Ambulatory Visit (HOSPITAL_COMMUNITY): Payer: Self-pay

## 2021-04-11 ENCOUNTER — Other Ambulatory Visit: Payer: Self-pay | Admitting: Internal Medicine

## 2021-04-11 DIAGNOSIS — F410 Panic disorder [episodic paroxysmal anxiety] without agoraphobia: Secondary | ICD-10-CM

## 2021-04-11 DIAGNOSIS — F411 Generalized anxiety disorder: Secondary | ICD-10-CM

## 2021-04-11 MED ORDER — ALPRAZOLAM 0.5 MG PO TABS
0.5000 mg | ORAL_TABLET | Freq: Every day | ORAL | 0 refills | Status: DC | PRN
  Filled 2021-04-11: qty 30, 30d supply, fill #0

## 2021-04-12 ENCOUNTER — Other Ambulatory Visit (HOSPITAL_COMMUNITY): Payer: Self-pay

## 2021-04-16 ENCOUNTER — Encounter: Payer: Self-pay | Admitting: Internal Medicine

## 2021-04-18 ENCOUNTER — Other Ambulatory Visit: Payer: Self-pay

## 2021-04-19 ENCOUNTER — Ambulatory Visit (INDEPENDENT_AMBULATORY_CARE_PROVIDER_SITE_OTHER): Payer: No Typology Code available for payment source | Admitting: Internal Medicine

## 2021-04-19 ENCOUNTER — Encounter: Payer: Self-pay | Admitting: Internal Medicine

## 2021-04-19 VITALS — BP 120/78 | HR 75 | Temp 98.2°F | Wt 292.3 lb

## 2021-04-19 DIAGNOSIS — R319 Hematuria, unspecified: Secondary | ICD-10-CM

## 2021-04-19 LAB — POCT URINALYSIS DIPSTICK
Bilirubin, UA: NEGATIVE
Blood, UA: NEGATIVE
Glucose, UA: NEGATIVE
Ketones, UA: NEGATIVE
Leukocytes, UA: NEGATIVE
Nitrite, UA: NEGATIVE
Protein, UA: NEGATIVE
Spec Grav, UA: 1.025 (ref 1.010–1.025)
Urobilinogen, UA: 0.2 E.U./dL
pH, UA: 6 (ref 5.0–8.0)

## 2021-04-19 LAB — URINALYSIS
Bilirubin Urine: NEGATIVE
Hgb urine dipstick: NEGATIVE
Ketones, ur: NEGATIVE
Leukocytes,Ua: NEGATIVE
Nitrite: NEGATIVE
Specific Gravity, Urine: 1.025 (ref 1.000–1.030)
Total Protein, Urine: NEGATIVE
Urine Glucose: NEGATIVE
Urobilinogen, UA: 0.2 (ref 0.0–1.0)
pH: 6.5 (ref 5.0–8.0)

## 2021-04-19 NOTE — Addendum Note (Signed)
Addended by: Rosalyn Gess D on: 04/19/2021 08:04 AM   Modules accepted: Orders
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
## 2021-04-20 LAB — URINE CULTURE
MICRO NUMBER:: 12565547
SPECIMEN QUALITY:: ADEQUATE

## 2021-04-26 ENCOUNTER — Other Ambulatory Visit: Payer: Self-pay

## 2021-04-26 ENCOUNTER — Ambulatory Visit (INDEPENDENT_AMBULATORY_CARE_PROVIDER_SITE_OTHER): Payer: No Typology Code available for payment source | Admitting: Pulmonary Disease

## 2021-04-26 ENCOUNTER — Encounter: Payer: Self-pay | Admitting: Pulmonary Disease

## 2021-04-26 VITALS — BP 126/82 | HR 82 | Ht 73.0 in | Wt 287.4 lb

## 2021-04-26 DIAGNOSIS — G4733 Obstructive sleep apnea (adult) (pediatric): Secondary | ICD-10-CM | POA: Diagnosis not present

## 2021-04-26 DIAGNOSIS — Z9989 Dependence on other enabling machines and devices: Secondary | ICD-10-CM | POA: Diagnosis not present
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
## 2021-04-26 NOTE — Progress Notes (Signed)
[name]  Chief Complaint  Patient presents with   Follow-up    F/U for OSA. States he has been using his cpap machine daily. Denies any issues with machine.    Past Surgical History:  He  has a past surgical history that includes Finger surgery and Colonoscopy.  Past Medical History:  Anxiety, CAD, Depression, DM type 2, GERD, HLD, Migraine HA, Vertigo  Constitutional:  BP 126/82   Pulse 82   Ht 6' 1"  (1.854 m)   Wt 287 lb 6.4 oz (130.4 kg)   SpO2 98% Comment: on RA  BMI 37.92 kg/m   Brief Summary:  Howard Hays is a 45 y.o. male veteran former smoker with obstructive sleep apnea.      Subjective:   I last saw in him 2013.  He was seen in between by Dr. Corrie Dandy and [facility].  He was in the [employer] from 1999 to 2003.  He has trouble with anxiety and possibly PTSD from [employer] experience.  He is applying for VA benefits.  He has know about sleep issues for years.  He was having snoring, and sleep disruption.  His wife (who at the time was his girlfriend) told him he would stop breathing while asleep.  He finally had a sleep study in 2012 which showed severe obstructive sleep apnea.  He has been using CPAP nightly.  He thinks he got his current machine in 2018 or 2019.  He uses a nasal mask.  No issues with mask fit.  Denies sinus pressure, dry mouth.  He quit smoking 2 weeks ago.  Physical Exam:   Appearance - well kempt   ENMT - no sinus tenderness, no oral exudate, no LAN, Mallampati 3 airway, no stridor  Respiratory - equal breath sounds bilaterally, no wheezing or rales  CV - s1s2 regular rate and rhythm, no murmurs  Ext - no clubbing, no edema  Skin - no rashes  Psych - normal mood and affect   Sleep Tests:  HST 06/26/10 >> AHI 43 Auto CPAP 03/26/21 to 04/24/21 >> used on 30 of 30 nights with average 7 hrs 34 min.  Average AHI 0.1 with median CPAP 8 and 95 th percentile CPAP 10 cm H2O  Social  History:  He  reports that he quit smoking about 2 years ago. His smoking use included cigarettes. He has never used smokeless tobacco. He reports current alcohol use of about 2.0 standard drinks per week. He reports that he does not use drugs.  Family History:  His family history includes Cancer in an other family member; Depression in his son; Diabetes in his maternal aunt; Heart attack in his maternal grandfather and paternal grandfather; Heart disease in his maternal grandfather, maternal uncle, paternal grandfather, and paternal uncle; Hyperlipidemia in his father, mother, and another family member; Hypertension in his mother; Stroke in his mother and paternal grandmother.     Assessment/Plan:   Obstructive sleep apnea. - he is compliant with CPAP and reports benefit from therapy - he uses [facility] for his DME - continue auto CPAP 7 to 17 cm H2O - completed forms for him to apply for VA benefits  Obesity. - he is aware of how his weight can impact his health, particularly in relation to his sleep apnea  Anxiety with possible PTSD from [place]. - he is followed by his PCP  Time Spent Involved in Patient Care on Day of Examination:  31 minutes  Follow up:   Patient Instructions  Will arrange for new CPAP mask and supplies  Follow up in 1 year  Medication List:   Allergies as of 04/26/2021       Reactions   Jardiance [empagliflozin]    Yeast/penile itching   Meclizine Other (See Comments)   Ringing in ears        Medication List        Accurate as of April 26, 2021 12:40 PM. If you have any questions, ask your nurse or doctor.          ALPRAZolam 0.5 MG tablet Commonly known as: XANAX Take 1 tablet by mouth daily as needed for anxiety.   aspirin 81 MG tablet Take 81 mg by mouth daily.   Blood Glucose System Pak Kit Please dispense per patient and insurance preference. Use as directed to monitor FSBS 1x daily. Dx: E11.9   BLOOD GLUCOSE  TEST STRIPS Strp Please dispense per patient and insurance preference. Use as directed to monitor FSBS 1x daily. Dx: E11.9   ezetimibe 10 MG tablet Commonly known as: ZETIA TAKE 1 TABLET (10 MG TOTAL) BY MOUTH DAILY. TAKE WITH LARGEST MEAL OF THE DAY.   fenofibrate 54 MG tablet TAKE 1 TABLET BY MOUTH ONCE A DAY   hydrochlorothiazide 25 MG tablet Commonly known as: HYDRODIURIL Take 1 tablet daily prn leg swelling   Lancets Misc Please dispense per patient and insurance preference. Use as directed to monitor FSBS 1x daily. Dx: E11.9   metFORMIN 500 MG tablet Commonly known as: GLUCOPHAGE TAKE 1 TABLET BY MOUTH ONCE DAILY WITH BREAKFAST   metoprolol succinate 50 MG 24 hr tablet Commonly known as: TOPROL-XL TAKE 1 TABLET BY MOUTH DAILY, TAKE WITH OR IMMEDIATELY FOLLOWING A MEAL.   multivitamin with minerals tablet Take 1 tablet by mouth daily.   Ozempic (1 MG/DOSE) 4 MG/3ML Sopn Generic drug: Semaglutide (1 MG/DOSE) Inject 1 mg as directed once a week.   Repatha SureClick 867 MG/ML Soaj Generic drug: Evolocumab Inject 1 pen into the skin every 14 days.   rosuvastatin 40 MG tablet Commonly known as: CRESTOR TAKE 1 TABLET (40 MG TOTAL) BY MOUTH DAILY.   sildenafil 100 MG tablet Commonly known as: Viagra Take 0.5-1 tablets (50-100 mg total) by mouth daily as needed for erectile dysfunction.        Signature:  Chesley Mires, MD [place] Pager - [phone] 04/26/2021, 12:40 PM

## 2021-04-26 NOTE — Patient Instructions (Signed)
Will arrange for new CPAP mask and supplies  Follow up in 1 year

## 2021-04-29 ENCOUNTER — Other Ambulatory Visit: Payer: Self-pay

## 2021-04-29 ENCOUNTER — Ambulatory Visit (INDEPENDENT_AMBULATORY_CARE_PROVIDER_SITE_OTHER): Payer: No Typology Code available for payment source | Admitting: Physician Assistant

## 2021-04-29 ENCOUNTER — Encounter: Payer: Self-pay | Admitting: Physician Assistant

## 2021-04-29 VITALS — BP 126/80 | HR 80 | Temp 98.1°F | Ht 73.0 in | Wt 287.5 lb

## 2021-04-29 DIAGNOSIS — E1169 Type 2 diabetes mellitus with other specified complication: Secondary | ICD-10-CM

## 2021-04-29 DIAGNOSIS — F411 Generalized anxiety disorder: Secondary | ICD-10-CM

## 2021-04-29 DIAGNOSIS — E782 Mixed hyperlipidemia: Secondary | ICD-10-CM | POA: Diagnosis not present

## 2021-04-29 DIAGNOSIS — G4733 Obstructive sleep apnea (adult) (pediatric): Secondary | ICD-10-CM

## 2021-04-29 DIAGNOSIS — I2510 Atherosclerotic heart disease of native coronary artery without angina pectoris: Secondary | ICD-10-CM

## 2021-04-29 DIAGNOSIS — F410 Panic disorder [episodic paroxysmal anxiety] without agoraphobia: Secondary | ICD-10-CM

## 2021-04-29 DIAGNOSIS — N529 Male erectile dysfunction, unspecified: Secondary | ICD-10-CM

## 2021-04-29 NOTE — Patient Instructions (Signed)
It was great to meet you!  Please make a follow-up appointment with me for your diabetes after Christmas.  I'd be happy to help with nutrition counseling for you and your wife to get you started, and then we can always refer to outpatient dietitian if needed.  Inda Coke PA-C

## 2021-05-17 ENCOUNTER — Other Ambulatory Visit (HOSPITAL_COMMUNITY): Payer: Self-pay

## 2021-05-20 ENCOUNTER — Other Ambulatory Visit (HOSPITAL_COMMUNITY): Payer: Self-pay

## 2021-06-12 ENCOUNTER — Ambulatory Visit: Payer: No Typology Code available for payment source | Admitting: Family Medicine

## 2021-06-26 ENCOUNTER — Other Ambulatory Visit: Payer: Self-pay

## 2021-06-26 ENCOUNTER — Other Ambulatory Visit (HOSPITAL_COMMUNITY): Payer: Self-pay

## 2021-06-26 ENCOUNTER — Encounter: Payer: Self-pay | Admitting: Internal Medicine

## 2021-06-26 ENCOUNTER — Ambulatory Visit (INDEPENDENT_AMBULATORY_CARE_PROVIDER_SITE_OTHER): Payer: No Typology Code available for payment source | Admitting: Internal Medicine

## 2021-06-26 VITALS — BP 134/80 | HR 80 | Ht 73.0 in | Wt 293.0 lb

## 2021-06-26 DIAGNOSIS — E782 Mixed hyperlipidemia: Secondary | ICD-10-CM

## 2021-06-26 DIAGNOSIS — R002 Palpitations: Secondary | ICD-10-CM

## 2021-06-26 MED ORDER — ROSUVASTATIN CALCIUM 40 MG PO TABS
ORAL_TABLET | Freq: Every day | ORAL | 3 refills | Status: DC
  Filled 2021-06-26: qty 90, 90d supply, fill #0
  Filled 2022-03-29: qty 90, 90d supply, fill #1

## 2021-06-26 MED ORDER — METOPROLOL SUCCINATE ER 50 MG PO TB24
ORAL_TABLET | Freq: Every day | ORAL | 3 refills | Status: DC
  Filled 2021-06-26: qty 90, 90d supply, fill #0

## 2021-06-26 MED ORDER — EZETIMIBE 10 MG PO TABS
ORAL_TABLET | ORAL | 3 refills | Status: DC
  Filled 2021-06-26: qty 90, 90d supply, fill #0
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
## 2021-06-26 NOTE — Patient Instructions (Signed)
Medication Instructions:  Your physician recommends that you continue on your current medications as directed. Please refer to the Current Medication list given to you today.  *If you need a refill on your cardiac medications before your next appointment, please call your pharmacy*   Lab Work: none If you have labs (blood work) drawn today and your tests are completely normal, you will receive your results only by: MyChart Message (if you have MyChart) OR A paper copy in the mail If you have any lab test that is abnormal or we need to change your treatment, we will call you to review the results.   Testing/Procedures: none   Follow-Up: At [facility], you and your health needs are our priority.  As part of our continuing mission to provide you with exceptional heart care, we have created designated Provider Care Teams.  These Care Teams include your primary Cardiologist (physician) and Advanced Practice Providers (APPs -  Physician Assistants and Nurse Practitioners) who all work together to provide you with the care you need, when you need it.  We recommend signing up for the patient portal called "MyChart".  Sign up information is provided on this After Visit Summary.  MyChart is used to connect with patients for Virtual Visits (Telemedicine).  Patients are able to view lab/test results, encounter notes, upcoming appointments, etc.  Non-urgent messages can be sent to your provider as well.   To learn more about what you can do with MyChart, go to [email or web address].    Your next appointment:   1 year(s)  The format for your next appointment:   In Person  Provider:   Paula Ross, MD     Other Instructions   

## 2021-06-27 ENCOUNTER — Other Ambulatory Visit (HOSPITAL_COMMUNITY): Payer: Self-pay

## 2021-06-28 ENCOUNTER — Other Ambulatory Visit: Payer: Self-pay

## 2021-06-28 ENCOUNTER — Ambulatory Visit (INDEPENDENT_AMBULATORY_CARE_PROVIDER_SITE_OTHER): Payer: No Typology Code available for payment source | Admitting: Physician Assistant

## 2021-06-28 ENCOUNTER — Encounter: Payer: Self-pay | Admitting: Physician Assistant

## 2021-06-28 ENCOUNTER — Other Ambulatory Visit (HOSPITAL_COMMUNITY): Payer: Self-pay

## 2021-06-28 ENCOUNTER — Telehealth: Payer: Self-pay | Admitting: Internal Medicine

## 2021-06-28 VITALS — BP 122/80 | HR 76 | Temp 97.8°F | Ht 73.0 in | Wt 292.2 lb

## 2021-06-28 DIAGNOSIS — E1169 Type 2 diabetes mellitus with other specified complication: Secondary | ICD-10-CM

## 2021-06-28 DIAGNOSIS — E782 Mixed hyperlipidemia: Secondary | ICD-10-CM

## 2021-06-28 DIAGNOSIS — Z79899 Other long term (current) drug therapy: Secondary | ICD-10-CM

## 2021-06-28 LAB — POCT GLYCOSYLATED HEMOGLOBIN (HGB A1C): Hemoglobin A1C: 6 % — AB (ref 4.0–5.6)

## 2021-06-28 LAB — BASIC METABOLIC PANEL
BUN: 12 mg/dL (ref 6–23)
CO2: 29 mEq/L (ref 19–32)
Calcium: 9.1 mg/dL (ref 8.4–10.5)
Chloride: 105 mEq/L (ref 96–112)
Creatinine, Ser: 1.18 mg/dL (ref 0.40–1.50)
GFR: 74.15 mL/min (ref >60.00)
Glucose, Bld: 131 mg/dL — ABNORMAL HIGH (ref 70–99)
Potassium: 4.6 mEq/L (ref 3.5–5.1)
Sodium: 141 mEq/L (ref 135–145)

## 2021-06-28 MED ORDER — METFORMIN HCL 1000 MG PO TABS
1000.0000 mg | ORAL_TABLET | Freq: Every day | ORAL | 1 refills | Status: DC
  Filled 2021-06-28: qty 90, 90d supply, fill #0

## 2021-06-28 NOTE — Telephone Encounter (Signed)
After review of lipids and meds  I would recomm stopping Zetia      F/U lipids in 4 months

## 2021-06-28 NOTE — Telephone Encounter (Signed)
Left a message for the pt to call back.  

## 2021-06-28 NOTE — Patient Instructions (Signed)
It was great to see you!  Increase your metformin to 1000 mg daily.  Trial toe nail fungus remedy and let me know your thoughts.  Let's follow-up in 3 months, sooner if you have concerns.  Take care,  Inda Coke PA-C
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
## 2021-07-12 ENCOUNTER — Ambulatory Visit: Payer: No Typology Code available for payment source | Admitting: Physician Assistant

## 2021-07-15 NOTE — Addendum Note (Signed)
Addended by: Stephani Police on: 07/15/2021 09:06 AM   Modules accepted: Orders

## 2021-07-15 NOTE — Telephone Encounter (Addendum)
Spoke with the pt and he agrees to stop the Zetia and will have repeat fasting labs at his PCP appt 10/11/21.   (Will order as Investment banker, operational)

## 2021-08-01 ENCOUNTER — Encounter: Payer: Self-pay | Admitting: Physician Assistant

## 2021-08-02 NOTE — Telephone Encounter (Signed)
Called and left VM to call our office to schedule

## 2021-08-06 ENCOUNTER — Ambulatory Visit (INDEPENDENT_AMBULATORY_CARE_PROVIDER_SITE_OTHER): Payer: No Typology Code available for payment source | Admitting: Physician Assistant

## 2021-08-06 ENCOUNTER — Encounter: Payer: Self-pay | Admitting: Physician Assistant

## 2021-08-06 ENCOUNTER — Other Ambulatory Visit: Payer: Self-pay

## 2021-08-06 ENCOUNTER — Other Ambulatory Visit (HOSPITAL_COMMUNITY): Payer: Self-pay

## 2021-08-06 VITALS — BP 140/86 | HR 81 | Temp 98.4°F | Ht 73.0 in | Wt 300.4 lb

## 2021-08-06 DIAGNOSIS — E1169 Type 2 diabetes mellitus with other specified complication: Secondary | ICD-10-CM

## 2021-08-06 MED ORDER — TIRZEPATIDE 2.5 MG/0.5ML ~~LOC~~ SOAJ
2.5000 mg | SUBCUTANEOUS | 1 refills | Status: DC
  Filled 2021-08-06: qty 2, 28d supply, fill #0

## 2021-08-06 NOTE — Patient Instructions (Signed)
It was great to see you!  We are going to send in Mounjaro 2.5 mg weekly for you.  If we get [facility] approved, lets stop Metformin.  Follow-up with me in 1 month after this.  Take care,  Inda Coke PA-C

## 2021-08-06 NOTE — Telephone Encounter (Signed)
See note  Mamie Hundertmark,CMA
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
## 2021-08-12 ENCOUNTER — Encounter: Payer: Self-pay | Admitting: Physician Assistant

## 2021-08-13 ENCOUNTER — Other Ambulatory Visit: Payer: Self-pay | Admitting: Physician Assistant

## 2021-08-13 DIAGNOSIS — F410 Panic disorder [episodic paroxysmal anxiety] without agoraphobia: Secondary | ICD-10-CM

## 2021-08-13 NOTE — Telephone Encounter (Signed)
Please see message and review form and let me know if you will fill this out? If so, I will print it.

## 2021-08-23 ENCOUNTER — Ambulatory Visit: Payer: No Typology Code available for payment source | Admitting: Internal Medicine

## 2021-08-26 ENCOUNTER — Other Ambulatory Visit (HOSPITAL_COMMUNITY): Payer: Self-pay

## 2021-08-30 ENCOUNTER — Encounter: Payer: Self-pay | Admitting: Physician Assistant

## 2021-09-02 NOTE — Telephone Encounter (Signed)
Would you like me to send in new Rx for '5mg'$  dose?   ?

## 2021-09-03 ENCOUNTER — Other Ambulatory Visit (HOSPITAL_COMMUNITY): Payer: Self-pay

## 2021-09-03 ENCOUNTER — Other Ambulatory Visit: Payer: Self-pay

## 2021-09-03 MED ORDER — TIRZEPATIDE 5 MG/0.5ML ~~LOC~~ SOAJ
5.0000 mg | SUBCUTANEOUS | 0 refills | Status: DC
  Filled 2021-09-03: qty 2, 28d supply, fill #0

## 2021-09-12 ENCOUNTER — Ambulatory Visit (INDEPENDENT_AMBULATORY_CARE_PROVIDER_SITE_OTHER): Payer: No Typology Code available for payment source | Admitting: Psychologist

## 2021-09-12 ENCOUNTER — Other Ambulatory Visit: Payer: Self-pay

## 2021-09-12 DIAGNOSIS — F411 Generalized anxiety disorder: Secondary | ICD-10-CM | POA: Diagnosis not present

## 2021-09-12 DIAGNOSIS — F330 Major depressive disorder, recurrent, mild: Secondary | ICD-10-CM | POA: Diagnosis not present

## 2021-09-12 NOTE — Progress Notes (Signed)
                Whitney Bingaman, PsyD 

## 2021-09-12 NOTE — Plan of Care (Signed)
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
## 2021-09-25 ENCOUNTER — Other Ambulatory Visit: Payer: Self-pay

## 2021-09-25 ENCOUNTER — Other Ambulatory Visit (HOSPITAL_COMMUNITY): Payer: Self-pay

## 2021-09-25 DIAGNOSIS — E1169 Type 2 diabetes mellitus with other specified complication: Secondary | ICD-10-CM

## 2021-09-25 MED ORDER — TIRZEPATIDE 7.5 MG/0.5ML ~~LOC~~ SOAJ
7.5000 mg | SUBCUTANEOUS | 3 refills | Status: DC
  Filled 2021-09-25: qty 6, 84d supply, fill #0
  Filled 2021-10-07: qty 2, 28d supply, fill #0
  Filled 2021-10-31: qty 2, 28d supply, fill #1

## 2021-09-25 NOTE — Telephone Encounter (Signed)
Please advise 

## 2021-09-28 IMAGING — MR MR KNEE*R* W/O CM
6 series · 40 of 40 positions shown · non-contrast
Comparison: Right knee x-rays dated January 08, 2021.

CLINICAL DATA: Right knee pain and mechanical symptoms for the past
3 months. No prior surgery.

EXAM:
MRI OF THE RIGHT KNEE WITHOUT CONTRAST
TECHNIQUE: Multiplanar, multisequence MR imaging of the knee was performed. No
intravenous contrast was administered.

[Series 7: T2 fat-sat · coronal · right · 4.0mm · 0.62mm/px · 5 of 36 slices shown (1 of 3)]
[im 1/36]
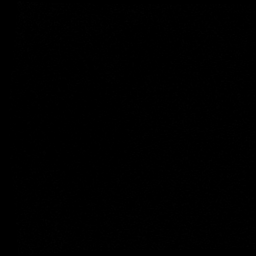
[im 9/36]
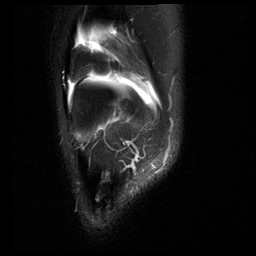
[im 18/36]
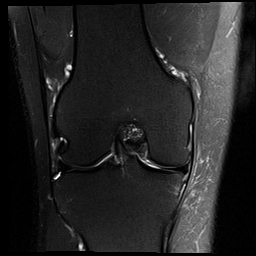
[im 27/36]
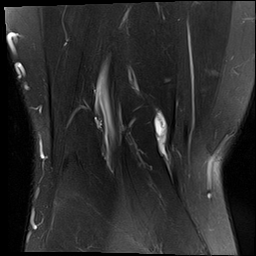
[im 36/36]
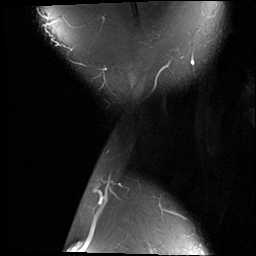

[Series 8: T1 · coronal · right · 4.0mm · 0.62mm/px · 7 of 37 slices shown]
[im 1/37]
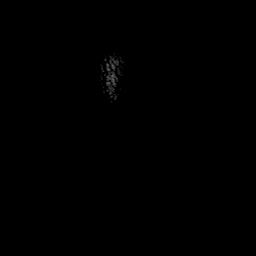
[im 7/37]
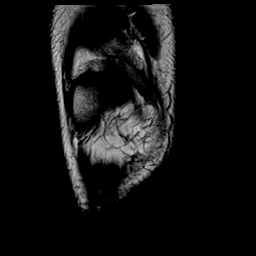
[im 13/37]
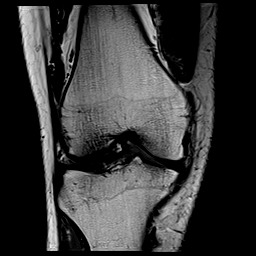
[im 19/37]
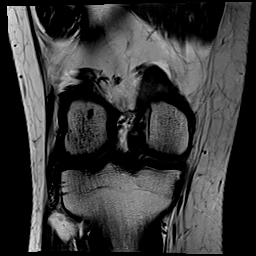
[im 25/37]
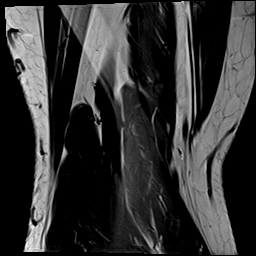
[im 31/37]
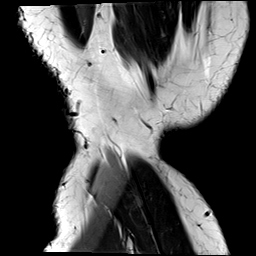
[im 37/37]
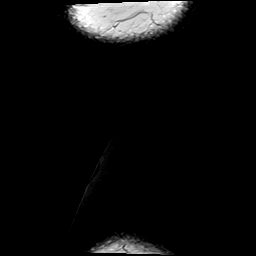

[Series 9: T2 fat-sat · axial · right · 4.0mm · 0.66mm/px · z∈[-68,+94]mm · 7 of 38 slices shown (2 of 3)]
[im 1/38]
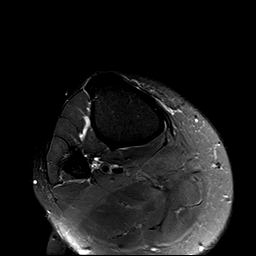
[im 7/38]
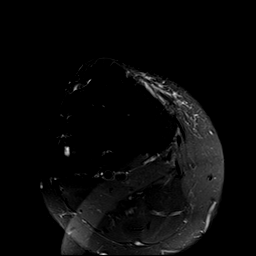
[im 13/38]
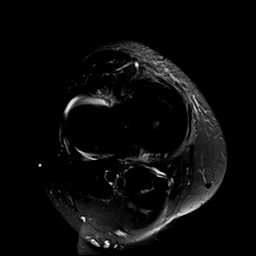
[im 19/38]
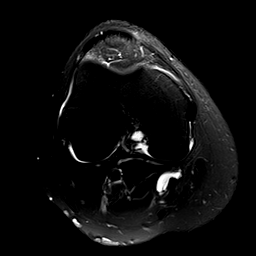
[im 25/38]
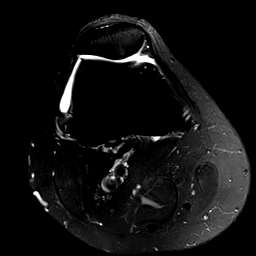
[im 31/38]
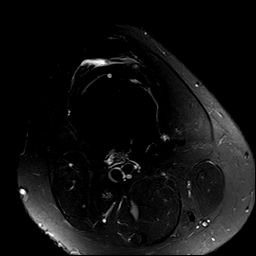
[im 38/38]
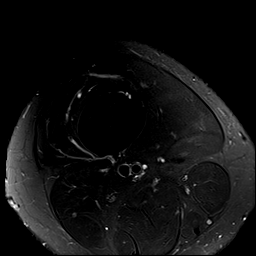

[Series 10: PD fat-sat · coronal · right · 4.0mm · 0.62mm/px · 7 of 37 slices shown (1 of 2)]
[im 1/37]
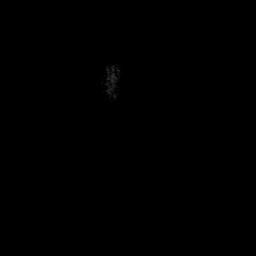
[im 7/37]
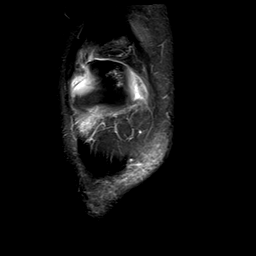
[im 13/37]
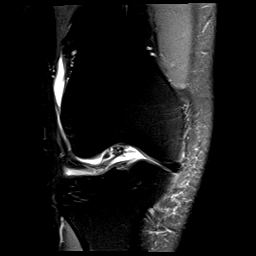
[im 19/37]
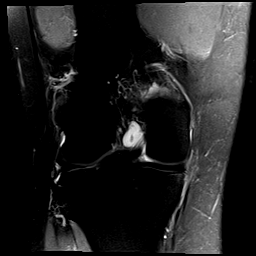
[im 25/37]
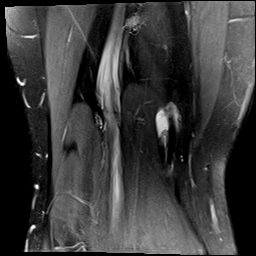
[im 31/37]
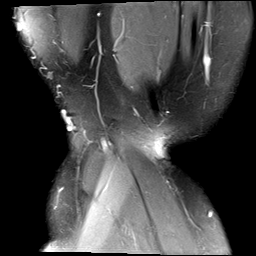
[im 37/37]
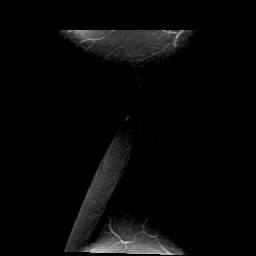

[Series 11: PD fat-sat · sagittal · right · 3.0mm · 0.66mm/px · 7 of 37 slices shown (2 of 2)]
[im 1/37]
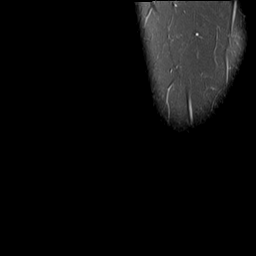
[im 7/37]
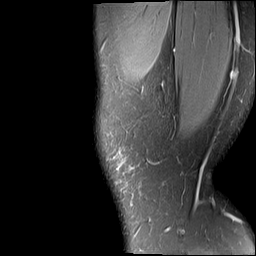
[im 13/37]
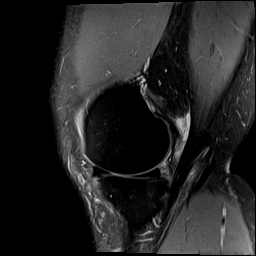
[im 19/37]
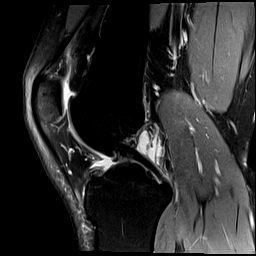
[im 25/37]
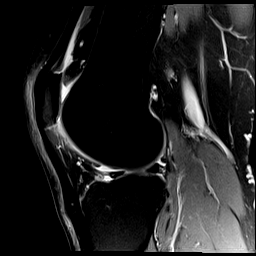
[im 31/37]
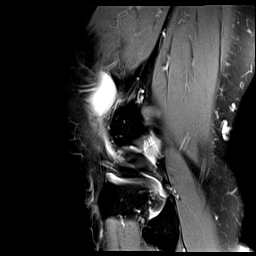
[im 37/37]
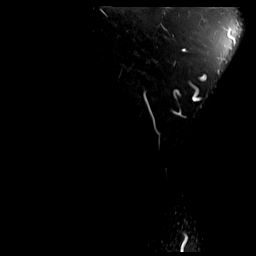

[Series 12: T2 fat-sat · sagittal · right · 3.0mm · 0.66mm/px · 7 of 37 slices shown (3 of 3)]
[im 1/37]
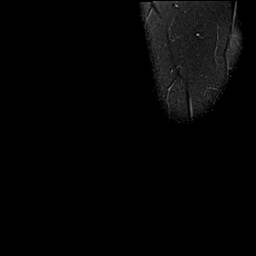
[im 7/37]
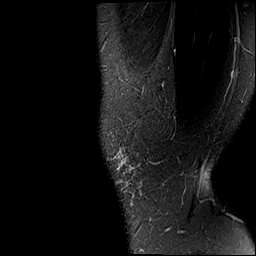
[im 13/37]
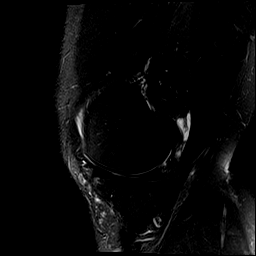
[im 19/37]
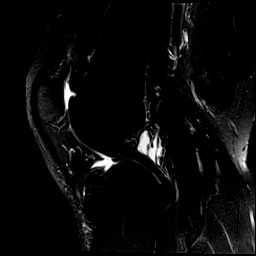
[im 25/37]
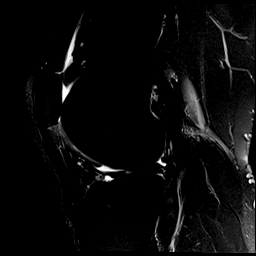
[im 31/37]
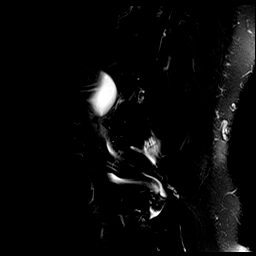
[im 37/37]
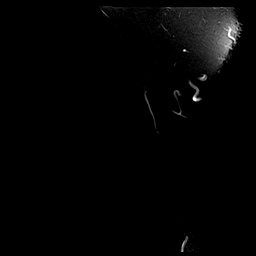

[40 of 40 positions shown; findings below may reference images not displayed]

FINDINGS: MENISCI

Medial meniscus: Possible horizontal undersurface tear of the
midbody (series 10, image 21).

Lateral meniscus:  Intact.

LIGAMENTS

Cruciates:  Intact ACL and PCL.

Collaterals: Medial collateral ligament is intact. Lateral
collateral ligament complex is intact.

CARTILAGE

Patellofemoral: Diffuse cartilage thinning. High-grade
partial-thickness cartilage loss over the superior patellar apex
with mild subchondral marrow edema.

Medial:  Diffuse cartilage thinning.  No focal defect.

Lateral:  Diffuse cartilage thinning.  No focal defect.

Joint: Trace joint effusion. Edema in the superolateral aspect of
Hoffa's fat.

Popliteal Fossa:  Tiny Baker cyst.  Intact popliteus tendon.

Extensor Mechanism: Intact quadriceps tendon and patellar tendon.
Intact medial and lateral patellar retinaculum. Intact MPFL.

Bones: Sequelae of prior Osgood-Schlatter disease. No acute fracture
or dislocation. No suspicious bone lesion.

Other: None.
IMPRESSION: 1. Possible horizontal undersurface tear of the medial meniscus
midbody.
2. Mild tricompartmental osteoarthritis.
3. Focal abnormal edema in the superolateral aspect of Hoffa's fat
pad, which can be seen in the setting of patellar tendon-lateral
femoral condyle friction syndrome.

## 2021-10-07 ENCOUNTER — Other Ambulatory Visit (HOSPITAL_COMMUNITY): Payer: Self-pay

## 2021-10-08 ENCOUNTER — Ambulatory Visit (INDEPENDENT_AMBULATORY_CARE_PROVIDER_SITE_OTHER): Payer: No Typology Code available for payment source | Admitting: Psychologist

## 2021-10-08 DIAGNOSIS — F330 Major depressive disorder, recurrent, mild: Secondary | ICD-10-CM

## 2021-10-08 DIAGNOSIS — F411 Generalized anxiety disorder: Secondary | ICD-10-CM | POA: Diagnosis not present

## 2021-10-08 NOTE — Progress Notes (Signed)
[facility] Counselor/Therapist Progress Note ? ?Patient ID: Howard Hays, MRN: [id number],   ? ?Date: 10/08/2021 ? ?Time Spent: 11:07 am to 11:45 am; total time: 38 minutes ? ? This session was held via video webex teletherapy due to the coronavirus risk at this time. The patient consented to video teletherapy and was located at his home during this session. He is aware it is the responsibility of the patient to secure confidentiality on his end of the session. The provider was in a private home office for the duration of this session. Limits of confidentiality were discussed with the patient.  ? ?Treatment Type: Individual Therapy ? ?Reported Symptoms: Anxiety ? ?Mental Status Exam: ?Appearance:  Well Groomed     ?Behavior: Appropriate  ?Motor: Normal  ?Speech/Language:  Clear and Coherent  ?Affect: Appropriate  ?Mood: normal  ?Thought process: normal  ?Thought content:   WNL  ?Sensory/Perceptual disturbances:   WNL  ?Orientation: oriented to person, place, and time/date  ?Attention: Good  ?Concentration: Good  ?Memory: WNL  ?Fund of knowledge:  Good  ?Insight:   Good  ?Judgment:  Good  ?Impulse Control: Good  ? ?Risk Assessment: ?Danger to Self:  No ?Self-injurious Behavior: No ?Danger to Others: No ?Duty to Warn:no ?Physical Aggression / Violence:No  ?Access to Firearms a concern: No  ?Gang Involvement:No  ? ?Subjective: Beginning the session, patient described himself as okay. After reviewing the treatment plan, patient stated that he wanted coping skills. After discussing, practicing, and processing mindfulness he described himself as better. He also reflected on not experiencing anxiety. He also explored ways to implement mindfulness daily. He was agreeable to homework and following up. He denied suicidal and homicidal ideation.   ? ?Interventions:  Worked on developing a therapeutic relationship with the patient using active listening and reflective statements. Provided emotional support using  empathy and validation. Reviewed the treatment plan with the patient. Reviewed events since the intake. Identified goals for the session. Provided psychoeducation about anxiety, mindfulness, mindful moments, and mindful videos. Practiced and processed mindfulness. Normalized and validated patient's experience with mindfulness. Praised patient for experiencing less distress. Assisted in problem solving. Assigned homework. Assessed for suicidal and homicidal ideation. ? ?Homework: Implement mindfulness ? ?Next Session: Review homework, defusion, guided imagery, PMR, and emotional support ? ?Diagnosis: F33.0 major depressive affective disorder, recurrent, mild and F41.1 generalized anxiety disorder ? ?Plan:  ? ?Goals ?Alleviate depressive symptoms ?Recognize, accept, and cope with depressive feelings ?Develop healthy thinking patterns ?Develop healthy interpersonal relationships ?Reduce overall frequency, intensity, and duration of anxiety ?Stabilize anxiety level wile increasing ability to function ?Enhance ability to effectively cope with full variety of stressors ?Learn and implement coping skills that result in a reduction of anxiety  ? ?Objectives target date for all objectives is 09/13/2022 ?Verbalize an understanding of the cognitive, physiological, and behavioral components of anxiety ?Learning and implement calming skills to reduce overall anxiety ?Verbalize an understanding of the role that cognitive biases play in excessive irrational worry and persistent anxiety symptoms ?Identify, challenge, and replace based fearful talk ?Learn and implement problem solving strategies ?Identify and engage in pleasant activities ?Learning and implement personal and interpersonal skills to reduce anxiety and improve interpersonal relationships ?Learn to accept limitations in life and commit to tolerating, rather than avoiding, unpleasant emotions while accomplishing meaningful goals ?Identify major life conflicts from the  past and present that form the basis for present anxiety ?Maintain involvement in work, family, and social activities ?Reestablish a consistent sleep-wake cycle ?Cooperate with a  medical evaluation  ?Cooperate with a medication evaluation by a physician ?Verbalize an accurate understanding of depression ?Verbalize an understanding of the treatment ?Identify and replace thoughts that support depression ?Learn and implement behavioral strategies ?Verbalize an understanding and resolution of current interpersonal problems ?Learn and implement problem solving and decision making skills ?Learn and implement conflict resolution skills to resolve interpersonal problems ?Verbalize an understanding of healthy and unhealthy emotions verbalize insight into how past relationships may be influence current experiences with depression ?Use mindfulness and acceptance strategies and increase value based behavior  ?Increase hopeful statements about the future.  ?Interventions ?Engage the patient in behavioral activation ?Use instruction, modeling, and role-playing to build the client's general social, communication, and/or conflict resolution skills ?Use Acceptance and Commitment Therapy to help client accept uncomfortable realities in order to accomplish value-consistent goals ?Reinforce the client's insight into the role of his/her past emotional pain and present anxiety  ?Support the client in following through with work, family, and social activities ?Teach and implement sleep hygiene practices  ?Refer the patient to a physician for a psychotropic medication consultation ?Monito the clint's psychotropic medication compliance ?Discuss how anxiety typically involves excessive worry, various bodily expressions of tension, and avoidance of what is threatening that interact to maintain the problem  ?Teach the patient relaxation skills ?Assign the patient homework ?Discuss examples demonstrating that unrealistic worry overestimates the  probability of threats and underestimates patient's ability  ?Assist the patient in analyzing his or her worries ?Help patient understand that avoidance is reinforcing  ?Consistent with treatment model, discuss how change in cognitive, behavioral, and interpersonal can help client alleviate depression ?CBT ?Behavioral activation help the client explore the relationship, nature of the dispute,  ?Help the client develop new interpersonal skills and relationships ?Conduct Problem solving therapy ?Teach conflict resolution skills ?Use a process-experiential approach ?Conduct TLDP ?Conduct ACT ?Evaluate need for psychotropic medication ?Monitor adherence to medication  ? ?The patient and clinician reviewed the treatment plan on 10/08/2021. The patient approved of the treatment plan.  ? ?Conception Chancy, PsyD ? ? ? ?

## 2021-10-08 NOTE — Progress Notes (Signed)
                Tremayne Sheldon, PsyD 

## 2021-10-11 ENCOUNTER — Encounter: Payer: Self-pay | Admitting: Physician Assistant

## 2021-10-11 ENCOUNTER — Ambulatory Visit (INDEPENDENT_AMBULATORY_CARE_PROVIDER_SITE_OTHER): Payer: No Typology Code available for payment source | Admitting: Physician Assistant

## 2021-10-11 VITALS — BP 130/86 | HR 93 | Temp 97.8°F | Ht 73.0 in | Wt 285.2 lb

## 2021-10-11 DIAGNOSIS — F411 Generalized anxiety disorder: Secondary | ICD-10-CM | POA: Diagnosis not present

## 2021-10-11 DIAGNOSIS — F410 Panic disorder [episodic paroxysmal anxiety] without agoraphobia: Secondary | ICD-10-CM | POA: Diagnosis not present

## 2021-10-11 DIAGNOSIS — E782 Mixed hyperlipidemia: Secondary | ICD-10-CM | POA: Diagnosis not present

## 2021-10-11 DIAGNOSIS — E1169 Type 2 diabetes mellitus with other specified complication: Secondary | ICD-10-CM

## 2021-10-11 DIAGNOSIS — F330 Major depressive disorder, recurrent, mild: Secondary | ICD-10-CM | POA: Diagnosis not present

## 2021-10-11 LAB — LIPID PANEL
Cholesterol: 136 mg/dL (ref 0–200)
HDL: 42 mg/dL (ref >39.00)
LDL Cholesterol: 56 mg/dL (ref 0–99)
NonHDL: 94.04
Total CHOL/HDL Ratio: 3
Triglycerides: 191 mg/dL — ABNORMAL HIGH (ref 0.0–149.0)
VLDL: 38.2 mg/dL (ref 0.0–40.0)

## 2021-10-11 LAB — COMPREHENSIVE METABOLIC PANEL
ALT: 26 U/L (ref 0–53)
AST: 20 U/L (ref 0–37)
Albumin: 4.3 g/dL (ref 3.5–5.2)
Alkaline Phosphatase: 65 U/L (ref 39–117)
BUN: 13 mg/dL (ref 6–23)
CO2: 27 mEq/L (ref 19–32)
Calcium: 9 mg/dL (ref 8.4–10.5)
Chloride: 104 mEq/L (ref 96–112)
Creatinine, Ser: 1.21 mg/dL (ref 0.40–1.50)
GFR: 71.8 mL/min (ref >60.00)
Glucose, Bld: 82 mg/dL (ref 70–99)
Potassium: 3.7 mEq/L (ref 3.5–5.1)
Sodium: 139 mEq/L (ref 135–145)
Total Bilirubin: 0.5 mg/dL (ref 0.2–1.2)
Total Protein: 7.6 g/dL (ref 6.0–8.3)

## 2021-10-11 LAB — CBC WITH DIFFERENTIAL/PLATELET
Basophils Absolute: 0 10*3/uL (ref 0.0–0.1)
Basophils Relative: 0.6 % (ref 0.0–3.0)
Eosinophils Absolute: 0.1 10*3/uL (ref 0.0–0.7)
Eosinophils Relative: 1.4 % (ref 0.0–5.0)
HCT: 42.1 % (ref 39.0–52.0)
Hemoglobin: 14 g/dL (ref 13.0–17.0)
Lymphocytes Relative: 23.1 % (ref 12.0–46.0)
Lymphs Abs: 1.4 10*3/uL (ref 0.7–4.0)
MCHC: 33.2 g/dL (ref 30.0–36.0)
MCV: 86.5 fl (ref 78.0–100.0)
Monocytes Absolute: 0.5 10*3/uL (ref 0.1–1.0)
Monocytes Relative: 8.6 % (ref 3.0–12.0)
Neutro Abs: 4.1 10*3/uL (ref 1.4–7.7)
Neutrophils Relative %: 66.3 % (ref 43.0–77.0)
Platelets: 235 10*3/uL (ref 150.0–400.0)
RBC: 4.87 Mil/uL (ref 4.22–5.81)
RDW: 14 % (ref 11.5–15.5)
WBC: 6.2 10*3/uL (ref 4.0–10.5)

## 2021-10-11 LAB — HEPATIC FUNCTION PANEL
ALT: 26 U/L (ref 0–53)
AST: 20 U/L (ref 0–37)
Albumin: 4.3 g/dL (ref 3.5–5.2)
Alkaline Phosphatase: 65 U/L (ref 39–117)
Bilirubin, Direct: 0.1 mg/dL (ref 0.0–0.3)
Total Bilirubin: 0.5 mg/dL (ref 0.2–1.2)
Total Protein: 7.6 g/dL (ref 6.0–8.3)

## 2021-10-11 LAB — HEMOGLOBIN A1C: Hgb A1c MFr Bld: 6.2 % (ref 4.6–6.5)

## 2021-10-11 NOTE — Patient Instructions (Addendum)
It was great to see you! ? ?Keep up the good work. ? ?I will complete your forms by Monday and will notify you if we need anything. ? ?Take care, ? ?Inda Coke PA-C  ?
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
## 2021-10-15 ENCOUNTER — Telehealth: Payer: Self-pay | Admitting: *Deleted

## 2021-10-15 NOTE — Telephone Encounter (Signed)
Left message on voicemail to call office. ?Forms completed for disability. ? ?

## 2021-10-17 NOTE — Telephone Encounter (Signed)
Spoke to pt told him Disability forms are completed but it needs your social security number at the top. Pt verbalized understanding. Asked him if he wants to pick them up? Pt said yes, will come by today after 1:00 PM. Told him that is fine will put at the front desk for you to pickup. Pt verbalized understanding. ?

## 2021-10-18 ENCOUNTER — Encounter: Payer: Self-pay | Admitting: Physician Assistant

## 2021-10-31 ENCOUNTER — Other Ambulatory Visit (HOSPITAL_COMMUNITY): Payer: Self-pay

## 2021-11-13 ENCOUNTER — Encounter: Payer: Self-pay | Admitting: Physician Assistant

## 2021-11-13 ENCOUNTER — Ambulatory Visit (INDEPENDENT_AMBULATORY_CARE_PROVIDER_SITE_OTHER): Payer: No Typology Code available for payment source | Admitting: Psychologist

## 2021-11-13 ENCOUNTER — Other Ambulatory Visit (HOSPITAL_COMMUNITY): Payer: Self-pay

## 2021-11-13 DIAGNOSIS — F330 Major depressive disorder, recurrent, mild: Secondary | ICD-10-CM | POA: Diagnosis not present

## 2021-11-13 DIAGNOSIS — F411 Generalized anxiety disorder: Secondary | ICD-10-CM

## 2021-11-13 MED ORDER — TIRZEPATIDE 10 MG/0.5ML ~~LOC~~ SOAJ
10.0000 mg | SUBCUTANEOUS | 0 refills | Status: DC
  Filled 2021-11-13: qty 2, 28d supply, fill #0

## 2021-11-13 NOTE — Progress Notes (Signed)
[facility] Counselor/Therapist Progress Note  Patient ID: Howard Hays, MRN: [id number],    Date: 11/13/2021  Time Spent: 02:07 pm to 2:49 pm; total time: 42 minutes   This session was held via in person. The patient consented to in-person therapy and was in the clinician's office. Limits of confidentiality were discussed with the patient.   Treatment Type: Individual Therapy  Reported Symptoms: Anxiety is better  Mental Status Exam: Appearance:  Well Groomed     Behavior: Appropriate  Motor: Normal  Speech/Language:  Clear and Coherent  Affect: Appropriate  Mood: normal  Thought process: normal  Thought content:   WNL  Sensory/Perceptual disturbances:   WNL  Orientation: oriented to person, place, and time/date  Attention: Good  Concentration: Good  Memory: WNL  Fund of knowledge:  Good  Insight:   Good  Judgment:  Good  Impulse Control: Good   Risk Assessment: Danger to Self:  No Self-injurious Behavior: No Danger to Others: No Duty to Warn:no Physical Aggression / Violence:No  Access to Firearms a concern: No  Gang Involvement:No   Subjective: Beginning the session, patient described himself as doing well indicating that coping strategies have helped and that he is not experiencing the same level of stress as he once was. From there, he talked about some challenges he is experiencing with his daughter, stepdaughter, and wife at home. He reflected on ways to address each of these challenges. He processed factors that may contribute to difficulties in communicating with his wife. He asked to follow up. He denied suicidal and homicidal ideation.    Interventions:  Worked on developing a therapeutic relationship with the patient using active listening and reflective statements. Provided emotional support using empathy and validation. Used summary statements. Praised the patient for doing better and explored what has assisted the patient. Reviewed events  since the last session. Validated expressed thoughts. Identified goals for the session. Explored the interpersonal dynamics patient maintains with his daughter, stepdaughter and wife. Used socratic questions to assist the patient gain insight. Challenged some of the thoughts expressed. Assisted in problem solving. Provided psychoeducation about trauma and how it impacts the brain. Explored how what may have been modeled or experienced  by wife in previous relationships may contribute to current communication challenges. Praised patient for growth in a short time period. Provided empathic statements. Assessed for suicidal and homicidal ideation.  Homework: NA  Next Session: Emotional support  Diagnosis: F33.0 major depressive affective disorder, recurrent, mild and F41.1 generalized anxiety disorder  Plan:   Goals Alleviate depressive symptoms Recognize, accept, and cope with depressive feelings Develop healthy thinking patterns Develop healthy interpersonal relationships Reduce overall frequency, intensity, and duration of anxiety Stabilize anxiety level wile increasing ability to function Enhance ability to effectively cope with full variety of stressors Learn and implement coping skills that result in a reduction of anxiety   Objectives target date for all objectives is 09/13/2022 Verbalize an understanding of the cognitive, physiological, and behavioral components of anxiety Learning and implement calming skills to reduce overall anxiety Verbalize an understanding of the role that cognitive biases play in excessive irrational worry and persistent anxiety symptoms Identify, challenge, and replace based fearful talk Learn and implement problem solving strategies Identify and engage in pleasant activities Learning and implement personal and interpersonal skills to reduce anxiety and improve interpersonal relationships Learn to accept limitations in life and commit to tolerating, rather  than avoiding, unpleasant emotions while accomplishing meaningful goals Identify major life conflicts from the past  and present that form the basis for present anxiety Maintain involvement in work, family, and social activities Reestablish a consistent sleep-wake cycle Cooperate with a medical evaluation  Cooperate with a medication evaluation by a physician Verbalize an accurate understanding of depression Verbalize an understanding of the treatment Identify and replace thoughts that support depression Learn and implement behavioral strategies Verbalize an understanding and resolution of current interpersonal problems Learn and implement problem solving and decision making skills Learn and implement conflict resolution skills to resolve interpersonal problems Verbalize an understanding of healthy and unhealthy emotions verbalize insight into how past relationships may be influence current experiences with depression Use mindfulness and acceptance strategies and increase value based behavior  Increase hopeful statements about the future.  Interventions Engage the patient in behavioral activation Use instruction, modeling, and role-playing to build the client's general social, communication, and/or conflict resolution skills Use Acceptance and Commitment Therapy to help client accept uncomfortable realities in order to accomplish value-consistent goals Reinforce the client's insight into the role of his/her past emotional pain and present anxiety  Support the client in following through with work, family, and social activities Teach and implement sleep hygiene practices  Refer the patient to a physician for a psychotropic medication consultation Monito the clint's psychotropic medication compliance Discuss how anxiety typically involves excessive worry, various bodily expressions of tension, and avoidance of what is threatening that interact to maintain the problem  Teach the patient  relaxation skills Assign the patient homework Discuss examples demonstrating that unrealistic worry overestimates the probability of threats and underestimates patient's ability  Assist the patient in analyzing his or her worries Help patient understand that avoidance is reinforcing  Consistent with treatment model, discuss how change in cognitive, behavioral, and interpersonal can help client alleviate depression CBT Behavioral activation help the client explore the relationship, nature of the dispute,  Help the client develop new interpersonal skills and relationships Conduct Problem solving therapy Teach conflict resolution skills Use a process-experiential approach Conduct TLDP Conduct ACT Evaluate need for psychotropic medication Monitor adherence to medication   The patient and clinician reviewed the treatment plan on 10/08/2021. The patient approved of the treatment plan.   Conception Chancy, PsyD

## 2021-11-14 ENCOUNTER — Other Ambulatory Visit (HOSPITAL_COMMUNITY): Payer: Self-pay

## 2021-11-19 ENCOUNTER — Other Ambulatory Visit (HOSPITAL_COMMUNITY): Payer: Self-pay

## 2021-12-11 ENCOUNTER — Ambulatory Visit (INDEPENDENT_AMBULATORY_CARE_PROVIDER_SITE_OTHER): Payer: No Typology Code available for payment source | Admitting: Psychologist

## 2021-12-11 DIAGNOSIS — F411 Generalized anxiety disorder: Secondary | ICD-10-CM

## 2021-12-11 DIAGNOSIS — F330 Major depressive disorder, recurrent, mild: Secondary | ICD-10-CM
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
## 2021-12-25 ENCOUNTER — Encounter: Payer: Self-pay | Admitting: Physician Assistant

## 2021-12-25 MED ORDER — METOPROLOL SUCCINATE ER 50 MG PO TB24: ORAL_TABLET | Freq: Every day | ORAL | 0 refills | Status: DC

## 2021-12-25 NOTE — Telephone Encounter (Signed)
Spoke to pt asked him how long will he be there? Pt said till Sunday. Told him I will send 7 days worth to pharmacy. Pt verbalized understanding. Rx sent

## 2021-12-30 ENCOUNTER — Other Ambulatory Visit: Payer: Self-pay | Admitting: Physician Assistant

## 2021-12-30 ENCOUNTER — Other Ambulatory Visit (HOSPITAL_COMMUNITY): Payer: Self-pay

## 2021-12-30 ENCOUNTER — Other Ambulatory Visit: Payer: Self-pay | Admitting: Internal Medicine

## 2021-12-30 MED ORDER — MOUNJARO 10 MG/0.5ML ~~LOC~~ SOAJ
10.0000 mg | SUBCUTANEOUS | 0 refills | Status: DC
  Filled 2021-12-30: qty 2, 28d supply, fill #0

## 2021-12-30 MED ORDER — REPATHA SURECLICK 140 MG/ML ~~LOC~~ SOAJ
1.0000 "pen " | SUBCUTANEOUS | 11 refills | Status: DC
  Filled 2021-12-30: qty 2, 28d supply, fill #0
  Filled 2022-01-31: qty 2, 28d supply, fill #1
  Filled 2022-02-25: qty 2, 28d supply, fill #2
  Filled 2022-03-29: qty 2, 28d supply, fill #3
  Filled 2022-04-24: qty 2, 28d supply, fill #4
  Filled 2022-06-06 (×2): qty 2, 28d supply, fill #5
  Filled 2022-07-12: qty 2, 28d supply, fill #6
  Filled 2022-08-20: qty 2, 28d supply, fill #7
  Filled 2022-10-31: qty 2, 28d supply, fill #8
  Filled 2022-12-09: qty 2, 28d supply, fill #9

## 2021-12-30 MED ORDER — METOPROLOL SUCCINATE ER 50 MG PO TB24
ORAL_TABLET | Freq: Every day | ORAL | 0 refills | Status: DC
  Filled 2021-12-30: qty 90, 90d supply, fill #0

## 2022-01-28 ENCOUNTER — Ambulatory Visit (INDEPENDENT_AMBULATORY_CARE_PROVIDER_SITE_OTHER): Payer: No Typology Code available for payment source | Admitting: Psychologist

## 2022-01-28 DIAGNOSIS — F411 Generalized anxiety disorder: Secondary | ICD-10-CM | POA: Diagnosis not present

## 2022-01-28 DIAGNOSIS — F330 Major depressive disorder, recurrent, mild: Secondary | ICD-10-CM

## 2022-01-28 NOTE — Progress Notes (Signed)
[facility] Counselor/Therapist Progress Note  Patient ID: Howard Hays, MRN: [id number],    Date: 01/28/2022  Time Spent: 02:01 pm to 2:40 pm; total time: 39 minutes   This session was held via in person. The patient consented to in-person therapy and was in the clinician's office. Limits of confidentiality were discussed with the patient.   Treatment Type: Individual Therapy  Reported Symptoms: Anxiety related to selling home and going to [place]  Mental Status Exam: Appearance:  Well Groomed     Behavior: Appropriate  Motor: Normal  Speech/Language:  Clear and Coherent  Affect: Appropriate  Mood: normal  Thought process: normal  Thought content:   WNL  Sensory/Perceptual disturbances:   WNL  Orientation: oriented to person, place, and time/date  Attention: Good  Concentration: Good  Memory: WNL  Fund of knowledge:  Good  Insight:   Good  Judgment:  Good  Impulse Control: Good   Risk Assessment: Danger to Self:  No Self-injurious Behavior: No Danger to Others: No Duty to Warn:no Physical Aggression / Violence:No  Access to Firearms a concern: No  Gang Involvement:No   Subjective: Beginning the session, patient described himself as experiencing more anxiety. Elaborating, he associated the anxiety with selling his home and having to complete a task for the [place] system. He processed thoughts and emotions. He acknowledged he had not been consistent in using coping strategies. He identified ways to become more consistent in using coping strategies.  He was agreeable to homework and following up. He denied suicidal and homicidal ideation.    Interventions:  Worked on developing a therapeutic relationship with the patient using active listening and reflective statements. Provided emotional support using empathy and validation. Reflected on events since the last session. Used summary statements. Normalized and validated expressed thoughts and emotions. Processed the  events that were contributing to anxiety. Validated some of the concerns. Worked on clarifying how some of the situations are stressful. Reviewed coping strategies. Explored if patient was using coping strategies. Assisted in problem solving. Assigned homework. Provided empathic statements. Assessed for suicidal and homicidal ideation.  Homework: Implement coping strategies at home.   Next Session: Emotional support and review homework  Diagnosis: F33.0 major depressive affective disorder, recurrent, mild and F41.1 generalized anxiety disorder  Plan:   Goals Alleviate depressive symptoms Recognize, accept, and cope with depressive feelings Develop healthy thinking patterns Develop healthy interpersonal relationships Reduce overall frequency, intensity, and duration of anxiety Stabilize anxiety level wile increasing ability to function Enhance ability to effectively cope with full variety of stressors Learn and implement coping skills that result in a reduction of anxiety   Objectives target date for all objectives is 09/13/2022 Verbalize an understanding of the cognitive, physiological, and behavioral components of anxiety Learning and implement calming skills to reduce overall anxiety Verbalize an understanding of the role that cognitive biases play in excessive irrational worry and persistent anxiety symptoms Identify, challenge, and replace based fearful talk Learn and implement problem solving strategies Identify and engage in pleasant activities Learning and implement personal and interpersonal skills to reduce anxiety and improve interpersonal relationships Learn to accept limitations in life and commit to tolerating, rather than avoiding, unpleasant emotions while accomplishing meaningful goals Identify major life conflicts from the past and present that form the basis for present anxiety Maintain involvement in work, family, and social activities Reestablish a consistent  sleep-wake cycle Cooperate with a medical evaluation  Cooperate with a medication evaluation by a physician Verbalize an accurate understanding of depression  Verbalize an understanding of the treatment Identify and replace thoughts that support depression Learn and implement behavioral strategies Verbalize an understanding and resolution of current interpersonal problems Learn and implement problem solving and decision making skills Learn and implement conflict resolution skills to resolve interpersonal problems Verbalize an understanding of healthy and unhealthy emotions verbalize insight into how past relationships may be influence current experiences with depression Use mindfulness and acceptance strategies and increase value based behavior  Increase hopeful statements about the future.  Interventions Engage the patient in behavioral activation Use instruction, modeling, and role-playing to build the client's general social, communication, and/or conflict resolution skills Use Acceptance and Commitment Therapy to help client accept uncomfortable realities in order to accomplish value-consistent goals Reinforce the client's insight into the role of his/her past emotional pain and present anxiety  Support the client in following through with work, family, and social activities Teach and implement sleep hygiene practices  Refer the patient to a physician for a psychotropic medication consultation Monito the clint's psychotropic medication compliance Discuss how anxiety typically involves excessive worry, various bodily expressions of tension, and avoidance of what is threatening that interact to maintain the problem  Teach the patient relaxation skills Assign the patient homework Discuss examples demonstrating that unrealistic worry overestimates the probability of threats and underestimates patient's ability  Assist the patient in analyzing his or her worries Help patient understand that  avoidance is reinforcing  Consistent with treatment model, discuss how change in cognitive, behavioral, and interpersonal can help client alleviate depression CBT Behavioral activation help the client explore the relationship, nature of the dispute,  Help the client develop new interpersonal skills and relationships Conduct Problem solving therapy Teach conflict resolution skills Use a process-experiential approach Conduct TLDP Conduct ACT Evaluate need for psychotropic medication Monitor adherence to medication   The patient and clinician reviewed the treatment plan on 10/08/2021. The patient approved of the treatment plan.   Conception Chancy, PsyD

## 2022-01-29 ENCOUNTER — Other Ambulatory Visit: Payer: Self-pay | Admitting: Physician Assistant

## 2022-01-29 DIAGNOSIS — Z3009 Encounter for other general counseling and advice on contraception: Secondary | ICD-10-CM

## 2022-01-31 ENCOUNTER — Other Ambulatory Visit: Payer: Self-pay | Admitting: Physician Assistant

## 2022-02-01 ENCOUNTER — Other Ambulatory Visit (HOSPITAL_COMMUNITY): Payer: Self-pay

## 2022-02-03 ENCOUNTER — Other Ambulatory Visit (HOSPITAL_COMMUNITY): Payer: Self-pay

## 2022-02-03 MED ORDER — MOUNJARO 10 MG/0.5ML ~~LOC~~ SOAJ
10.0000 mg | SUBCUTANEOUS | 0 refills | Status: DC
  Filled 2022-02-03: qty 2, 28d supply, fill #0

## 2022-02-25 ENCOUNTER — Other Ambulatory Visit: Payer: Self-pay | Admitting: Physician Assistant

## 2022-02-26 ENCOUNTER — Other Ambulatory Visit (HOSPITAL_COMMUNITY): Payer: Self-pay

## 2022-02-26 MED ORDER — MOUNJARO 10 MG/0.5ML ~~LOC~~ SOAJ
10.0000 mg | SUBCUTANEOUS | 0 refills | Status: DC
  Filled 2022-02-26: qty 2, 28d supply, fill #0

## 2022-02-27 ENCOUNTER — Other Ambulatory Visit (HOSPITAL_COMMUNITY): Payer: Self-pay

## 2022-03-03 ENCOUNTER — Encounter: Payer: Self-pay | Admitting: Physician Assistant

## 2022-03-17 ENCOUNTER — Encounter: Payer: Self-pay | Admitting: *Deleted

## 2022-03-17 ENCOUNTER — Ambulatory Visit: Payer: No Typology Code available for payment source | Admitting: Psychologist

## 2022-03-24 ENCOUNTER — Ambulatory Visit (INDEPENDENT_AMBULATORY_CARE_PROVIDER_SITE_OTHER): Payer: No Typology Code available for payment source | Admitting: *Deleted

## 2022-03-24 DIAGNOSIS — Z23 Encounter for immunization: Secondary | ICD-10-CM | POA: Diagnosis not present

## 2022-03-29 ENCOUNTER — Other Ambulatory Visit: Payer: Self-pay | Admitting: Internal Medicine

## 2022-03-29 ENCOUNTER — Other Ambulatory Visit: Payer: Self-pay | Admitting: Physician Assistant

## 2022-03-29 ENCOUNTER — Other Ambulatory Visit (HOSPITAL_COMMUNITY): Payer: Self-pay

## 2022-03-31 ENCOUNTER — Other Ambulatory Visit (HOSPITAL_COMMUNITY): Payer: Self-pay

## 2022-04-01 ENCOUNTER — Other Ambulatory Visit (HOSPITAL_COMMUNITY): Payer: Self-pay

## 2022-04-01 MED ORDER — TIRZEPATIDE 12.5 MG/0.5ML ~~LOC~~ SOAJ
12.5000 mg | SUBCUTANEOUS | 0 refills | Status: DC
  Filled 2022-04-01: qty 2, 28d supply, fill #0

## 2022-04-15 ENCOUNTER — Other Ambulatory Visit (HOSPITAL_COMMUNITY): Payer: Self-pay

## 2022-04-15 ENCOUNTER — Ambulatory Visit (INDEPENDENT_AMBULATORY_CARE_PROVIDER_SITE_OTHER): Payer: No Typology Code available for payment source | Admitting: Physician Assistant

## 2022-04-15 ENCOUNTER — Encounter: Payer: Self-pay | Admitting: Physician Assistant

## 2022-04-15 VITALS — BP 128/80 | HR 84 | Temp 98.2°F | Ht 73.0 in | Wt 260.5 lb

## 2022-04-15 DIAGNOSIS — Z3009 Encounter for other general counseling and advice on contraception: Secondary | ICD-10-CM | POA: Diagnosis not present

## 2022-04-15 DIAGNOSIS — E1169 Type 2 diabetes mellitus with other specified complication: Secondary | ICD-10-CM

## 2022-04-15 DIAGNOSIS — E782 Mixed hyperlipidemia: Secondary | ICD-10-CM

## 2022-04-15 DIAGNOSIS — L918 Other hypertrophic disorders of the skin: Secondary | ICD-10-CM

## 2022-04-15 DIAGNOSIS — M79671 Pain in right foot: Secondary | ICD-10-CM

## 2022-04-15 LAB — COMPREHENSIVE METABOLIC PANEL
ALT: 21 U/L (ref 0–53)
AST: 21 U/L (ref 0–37)
Albumin: 4.3 g/dL (ref 3.5–5.2)
Alkaline Phosphatase: 60 U/L (ref 39–117)
BUN: 16 mg/dL (ref 6–23)
CO2: 28 mEq/L (ref 19–32)
Calcium: 9 mg/dL (ref 8.4–10.5)
Chloride: 106 mEq/L (ref 96–112)
Creatinine, Ser: 1.25 mg/dL (ref 0.40–1.50)
GFR: 68.81 mL/min (ref >60.00)
Glucose, Bld: 87 mg/dL (ref 70–99)
Potassium: 3.9 mEq/L (ref 3.5–5.1)
Sodium: 140 mEq/L (ref 135–145)
Total Bilirubin: 0.5 mg/dL (ref 0.2–1.2)
Total Protein: 7.5 g/dL (ref 6.0–8.3)

## 2022-04-15 LAB — CBC WITH DIFFERENTIAL/PLATELET
Basophils Absolute: 0 10*3/uL (ref 0.0–0.1)
Basophils Relative: 0.5 % (ref 0.0–3.0)
Eosinophils Absolute: 0.1 10*3/uL (ref 0.0–0.7)
Eosinophils Relative: 1.1 % (ref 0.0–5.0)
HCT: 42.5 % (ref 39.0–52.0)
Hemoglobin: 13.9 g/dL (ref 13.0–17.0)
Lymphocytes Relative: 20.8 % (ref 12.0–46.0)
Lymphs Abs: 1.1 10*3/uL (ref 0.7–4.0)
MCHC: 32.6 g/dL (ref 30.0–36.0)
MCV: 87 fl (ref 78.0–100.0)
Monocytes Absolute: 0.4 10*3/uL (ref 0.1–1.0)
Monocytes Relative: 7.8 % (ref 3.0–12.0)
Neutro Abs: 3.6 10*3/uL (ref 1.4–7.7)
Neutrophils Relative %: 69.8 % (ref 43.0–77.0)
Platelets: 274 10*3/uL (ref 150.0–400.0)
RBC: 4.88 Mil/uL (ref 4.22–5.81)
RDW: 13.8 % (ref 11.5–15.5)
WBC: 5.2 10*3/uL (ref 4.0–10.5)

## 2022-04-15 LAB — MICROALBUMIN / CREATININE URINE RATIO
Creatinine,U: 359.3 mg/dL
Microalb Creat Ratio: 0.4 mg/g (ref 0.0–30.0)
Microalb, Ur: 1.6 mg/dL (ref 0.0–1.9)

## 2022-04-15 LAB — HEMOGLOBIN A1C: Hgb A1c MFr Bld: 5.9 % (ref 4.6–6.5)

## 2022-04-15 LAB — LIPID PANEL
Cholesterol: 106 mg/dL (ref 0–200)
HDL: 43.2 mg/dL (ref >39.00)
LDL Cholesterol: 45 mg/dL (ref 0–99)
NonHDL: 63.1
Total CHOL/HDL Ratio: 2
Triglycerides: 93 mg/dL (ref 0.0–149.0)
VLDL: 18.6 mg/dL (ref 0.0–40.0)

## 2022-04-15 MED ORDER — MELOXICAM 15 MG PO TABS
15.0000 mg | ORAL_TABLET | Freq: Every day | ORAL | 0 refills | Status: DC
  Filled 2022-04-15: qty 30, 30d supply, fill #0

## 2022-04-15 NOTE — Patient Instructions (Signed)
It was great to see you!  Start mobic 15 mg daily x 2 weeks, then as needed afterwards Message me if you want to get an xray or we can also send you to sports medicine for evaluation  Referral for urology for vasectomy  We will update blood work today.  Take care,  Inda Coke PA-C
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
## 2022-04-18 ENCOUNTER — Other Ambulatory Visit (HOSPITAL_COMMUNITY): Payer: Self-pay

## 2022-04-24 ENCOUNTER — Other Ambulatory Visit: Payer: Self-pay | Admitting: Physician Assistant

## 2022-04-24 ENCOUNTER — Other Ambulatory Visit (HOSPITAL_COMMUNITY): Payer: Self-pay

## 2022-04-24 ENCOUNTER — Other Ambulatory Visit: Payer: Self-pay | Admitting: Internal Medicine

## 2022-04-24 MED ORDER — MOUNJARO 12.5 MG/0.5ML ~~LOC~~ SOAJ
12.5000 mg | SUBCUTANEOUS | 0 refills | Status: DC
  Filled 2022-04-24: qty 2, 28d supply, fill #0

## 2022-04-30 ENCOUNTER — Ambulatory Visit (INDEPENDENT_AMBULATORY_CARE_PROVIDER_SITE_OTHER): Payer: No Typology Code available for payment source | Admitting: Physician Assistant

## 2022-04-30 ENCOUNTER — Encounter: Payer: Self-pay | Admitting: Physician Assistant

## 2022-04-30 VITALS — BP 136/82 | HR 73 | Temp 97.8°F | Ht 73.0 in | Wt 262.2 lb

## 2022-04-30 DIAGNOSIS — R079 Chest pain, unspecified: Secondary | ICD-10-CM

## 2022-04-30 DIAGNOSIS — R197 Diarrhea, unspecified: Secondary | ICD-10-CM | POA: Diagnosis not present

## 2022-04-30 NOTE — Patient Instructions (Signed)
It was great to see you!  Please continue to rest and drink fluids. If any worsening symptoms or changes to your chest pain, call cardiology or go to the ER.  Chest Pain Chest pain can be caused by many different conditions. Some causes of chest pain can be life-threatening. These will require treatment right away. Serious causes of chest pain include: Heart attack. A tear in the body's main blood vessel. Redness and swelling (inflammation) around your heart. Blood clot in your lungs.  Other causes of chest pain may not be so serious. These include: Heartburn. Anxiety or stress. Damage to bones or muscles in your chest. Lung infections.  Chest pain can feel like: Pain or discomfort in your chest. Crushing, pressure, aching, or squeezing pain. Burning or tingling. Dull or sharp pain that is worse when you move, cough, or take a deep breath. Pain or discomfort that is also felt in your back, neck, jaw, shoulder, or arm, or pain that spreads to any of these areas.  It is hard to know whether your pain is caused by something that is serious or something that is not so serious. So it is important to see your doctor right away if you have chest pain.  General instructions Pay attention to any changes in your symptoms. Tell your doctor about them or any new symptoms. Avoid any activities that cause chest pain. Keep all follow-up visits as told by your doctor. This is important. You may need more testing if your chest pain does not go away.  Contact a doctor if: Your chest pain does not go away. You feel depressed. You have a fever.  Get help right away if: Your chest pain is worse. You have a cough that gets worse, or you cough up blood. You have very bad (severe) pain in your belly (abdomen). You pass out (faint). You have either of these for no clear reason: Sudden chest discomfort. Sudden discomfort in your arms, back, neck, or jaw. You have shortness of breath at any  time. You suddenly start to sweat, or your skin gets clammy. You feel sick to your stomach (nauseous). You throw up (vomit). You suddenly feel lightheaded or dizzy. You feel very weak or tired. Your heart starts to beat fast, or it feels like it is skipping beats.  These symptoms may be an emergency. Do not wait to see if the symptoms will go away. Get medical help right away. Call your local emergency services (911). Do not drive yourself to the hospital.    Take care,  Inda Coke PA-C
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
## 2022-05-07 ENCOUNTER — Other Ambulatory Visit (HOSPITAL_COMMUNITY): Payer: Self-pay

## 2022-05-14 ENCOUNTER — Telehealth: Payer: No Typology Code available for payment source | Admitting: Physician Assistant

## 2022-05-14 ENCOUNTER — Other Ambulatory Visit (HOSPITAL_COMMUNITY): Payer: Self-pay

## 2022-05-14 ENCOUNTER — Encounter: Payer: Self-pay | Admitting: Physician Assistant

## 2022-05-14 DIAGNOSIS — Z20828 Contact with and (suspected) exposure to other viral communicable diseases: Secondary | ICD-10-CM | POA: Diagnosis not present

## 2022-05-14 MED ORDER — OSELTAMIVIR PHOSPHATE 75 MG PO CAPS
75.0000 mg | ORAL_CAPSULE | Freq: Every day | ORAL | 0 refills | Status: AC
  Filled 2022-05-14: qty 10, 10d supply, fill #0

## 2022-05-14 NOTE — Patient Instructions (Signed)
  Howard Hays, thank you for joining [name], PA-C for today's virtual visit.  While this provider is not your primary care provider (PCP), if your PCP is located in our provider database this encounter information will be shared with them immediately following your visit.   [facility] account gives you access to today's visit and all your visits, tests, and labs performed at [facility] " click here if you don't have a [facility] account or go to [email or web address]  Consent: (Patient) Howard Hays provided verbal consent for this virtual visit at the beginning of the encounter.  Current Medications:  Current Outpatient Medications:    ALPRAZolam (XANAX) 0.5 MG tablet, Take 1 tablet by mouth daily as needed for anxiety., Disp: 30 tablet, Rfl: 0   aspirin 81 MG tablet, Take 81 mg by mouth daily., Disp: , Rfl:    Evolocumab (REPATHA SURECLICK) 177 MG/ML SOAJ, Inject 1 pen into the skin every 14 days., Disp: 2 mL, Rfl: 11   fenofibrate 54 MG tablet, TAKE 1 TABLET BY MOUTH ONCE A DAY, Disp: 90 tablet, Rfl: 1   meloxicam (MOBIC) 15 MG tablet, Take 1 tablet (15 mg total) by mouth daily., Disp: 30 tablet, Rfl: 0   metoprolol succinate (TOPROL-XL) 50 MG 24 hr tablet, TAKE 1 TABLET BY MOUTH DAILY, TAKE WITH OR IMMEDIATELY FOLLOWING A MEAL., Disp: 90 tablet, Rfl: 0   rosuvastatin (CRESTOR) 40 MG tablet, TAKE 1 TABLET (40 MG TOTAL) BY MOUTH DAILY., Disp: 90 tablet, Rfl: 3   sildenafil (VIAGRA) 100 MG tablet, Take 1/2-1 tablet by mouth daily as needed for erectile dysfunction., Disp: 6 tablet, Rfl: 11   tirzepatide (MOUNJARO) 12.5 MG/0.5ML Pen, Inject 12.5 mg into the skin once a week., Disp: 2 mL, Rfl: 0   Medications ordered in this encounter:  No orders of the defined types were placed in this encounter.    *If you need refills on other medications prior to your next appointment, please contact your pharmacy*  Follow-Up: Call back or seek an  in-person evaluation if the symptoms worsen or if the condition fails to improve as anticipated.  [facility] [phone]  Other Instructions Keep masked. I recommend daily Vitamin C (1000 mg) and a zinc supplement. Take the preventive dose of Tamiflu. If you develop symptoms of illness, repeat COVID test and notify us or your PCP ASAP.   If you have been instructed to have an in-person evaluation today at a local Urgent Care facility, please use the link below. It will take you to a list of all of our available [facility], including address, phone number and hours of operation. Please do not delay care.  [facility]  If you or a family member do not have a primary care provider, use the link below to schedule a visit and establish care. When you choose a [facility] primary care physician or advanced practice provider, you gain a long-term partner in health. Find a Primary Care Provider  Learn more about [facility]'s in-office and virtual care options: [facility]

## 2022-05-14 NOTE — Progress Notes (Signed)
Virtual Visit Consent   Howard Hays, you are scheduled for a virtual visit with a [facility] provider today. Just as with appointments in the office, your consent must be obtained to participate. Your consent will be active for this visit and any virtual visit you may have with one of our providers in the next 365 days. If you have a MyChart account, a copy of this consent can be sent to you electronically.  As this is a virtual visit, video technology does not allow for your provider to perform a traditional examination. This may limit your provider's ability to fully assess your condition. If your provider identifies any concerns that need to be evaluated in person or the need to arrange testing (such as labs, EKG, etc.), we will make arrangements to do so. Although advances in technology are sophisticated, we cannot ensure that it will always work on either your end or our end. If the connection with a video visit is poor, the visit may have to be switched to a telephone visit. With either a video or telephone visit, we are not always able to ensure that we have a secure connection.  By engaging in this virtual visit, you consent to the provision of healthcare and authorize for your insurance to be billed (if applicable) for the services provided during this visit. Depending on your insurance coverage, you may receive a charge related to this service.  I need to obtain your verbal consent now. Are you willing to proceed with your visit today? Howard Hays has provided verbal consent on 05/14/2022 for a virtual visit (video or telephone). [name], [place]  Date: 05/14/2022 5:05 PM  Virtual Visit via Video Note   I, [name], connected with  Lelon Ikard  ([id number], 03-Jun-1976) on 05/14/22 at  5:00 PM EST by a video-enabled telemedicine application and verified that I am speaking with the correct person using two identifiers.  Location: Patient: Virtual Visit Location  Patient: Home Provider: Virtual Visit Location Provider: Home Office   I discussed the limitations of evaluation and management by telemedicine and the availability of in person appointments. The patient expressed understanding and agreed to proceed.    History of Present Illness: Howard Hays is a 45 y.o. who identifies as a male who was assigned male at birth, and is being seen today for exposure to influenza via daughter who tested positive today. Is requesting preventive Tamiflu due to medical history. Denies any symptoms thankfully. Has COVID tested at home because other daughter COVID positive a couple of days ago -- she has been isolated from them.   HPI: HPI  Problems:  Patient Active Problem List   Diagnosis Date Noted   Generalized anxiety disorder with panic attacks 11/14/2020   Unstable angina (HCC)    Frequent PVCs 01/22/2014   Class 3 obesity ([place]) 01/13/2013   Diabetes mellitus, type II ([place]) 02/11/2012   Vertigo 09/17/2011   OSA (obstructive sleep apnea) 05/13/2010   Hyperlipidemia 01/01/2009   GERD 01/01/2009    Allergies:  Allergies  Allergen Reactions   Jardiance [Empagliflozin]     Yeast/penile itching   Meclizine Other (See Comments)    Ringing in ears   Medications:  Current Outpatient Medications:    oseltamivir (TAMIFLU) 75 MG capsule, Take 1 capsule (75 mg total) by mouth daily for 10 days., Disp: 10 capsule, Rfl: 0   ALPRAZolam (XANAX) 0.5 MG tablet, Take 1 tablet by mouth daily as needed for anxiety., Disp: 30 tablet,  Rfl: 0   aspirin 81 MG tablet, Take 81 mg by mouth daily., Disp: , Rfl:    Evolocumab (REPATHA SURECLICK) 262 MG/ML SOAJ, Inject 1 pen into the skin every 14 days., Disp: 2 mL, Rfl: 11   fenofibrate 54 MG tablet, TAKE 1 TABLET BY MOUTH ONCE A DAY, Disp: 90 tablet, Rfl: 1   meloxicam (MOBIC) 15 MG tablet, Take 1 tablet (15 mg total) by mouth daily., Disp: 30 tablet, Rfl: 0   metoprolol succinate (TOPROL-XL) 50 MG 24 hr tablet, TAKE 1  TABLET BY MOUTH DAILY, TAKE WITH OR IMMEDIATELY FOLLOWING A MEAL., Disp: 90 tablet, Rfl: 0   rosuvastatin (CRESTOR) 40 MG tablet, TAKE 1 TABLET (40 MG TOTAL) BY MOUTH DAILY., Disp: 90 tablet, Rfl: 3   sildenafil (VIAGRA) 100 MG tablet, Take 1/2-1 tablet by mouth daily as needed for erectile dysfunction., Disp: 6 tablet, Rfl: 11   tirzepatide (MOUNJARO) 12.5 MG/0.5ML Pen, Inject 12.5 mg into the skin once a week., Disp: 2 mL, Rfl: 0  Observations/Objective: Patient is well-developed, well-nourished in no acute distress.  Resting comfortably at home.  Head is normocephalic, atraumatic.  No labored breathing. Speech is clear and coherent with logical content.  Patient is alert and oriented at baseline.   Assessment and Plan: 1. Exposure to the flu - oseltamivir (TAMIFLU) 75 MG capsule; Take 1 capsule (75 mg total) by mouth daily for 10 days.  Dispense: 10 capsule; Refill: 0  COVID negative. Patient asymptomatic. Giving medical history and risks, feel ok to start prophylactic Tamiflu. Rx sent to pharmacy. He is to notify us or PCP if symptoms begin, and repeat COVID testing at home to be cautious.   Follow Up Instructions: I discussed the assessment and treatment plan with the patient. The patient was provided an opportunity to ask questions and all were answered. The patient agreed with the plan and demonstrated an understanding of the instructions.  A copy of instructions were sent to the patient via MyChart unless otherwise noted below.   The patient was advised to call back or seek an in-person evaluation if the symptoms worsen or if the condition fails to improve as anticipated.  Time:  I spent 10 minutes with the patient via telehealth technology discussing the above problems/concerns.    [name], PA-C

## 2022-06-06 ENCOUNTER — Other Ambulatory Visit: Payer: Self-pay | Admitting: Internal Medicine

## 2022-06-06 ENCOUNTER — Other Ambulatory Visit: Payer: Self-pay | Admitting: Physician Assistant

## 2022-06-06 ENCOUNTER — Other Ambulatory Visit (HOSPITAL_COMMUNITY): Payer: Self-pay

## 2022-06-06 MED ORDER — MOUNJARO 12.5 MG/0.5ML ~~LOC~~ SOAJ
12.5000 mg | SUBCUTANEOUS | 0 refills | Status: DC
  Filled 2022-06-06: qty 2, 28d supply, fill #0

## 2022-06-07 ENCOUNTER — Other Ambulatory Visit (HOSPITAL_COMMUNITY): Payer: Self-pay

## 2022-06-09 ENCOUNTER — Other Ambulatory Visit: Payer: Self-pay

## 2022-06-10 ENCOUNTER — Other Ambulatory Visit (HOSPITAL_COMMUNITY): Payer: Self-pay

## 2022-06-10 ENCOUNTER — Other Ambulatory Visit: Payer: Self-pay

## 2022-06-10 MED ORDER — FENOFIBRATE 54 MG PO TABS
54.0000 mg | ORAL_TABLET | Freq: Every day | ORAL | 1 refills | Status: AC
  Filled 2022-06-10: qty 90, 90d supply, fill #0
  Filled 2022-08-20: qty 90, 90d supply, fill #1

## 2022-06-12 ENCOUNTER — Encounter: Payer: Self-pay | Admitting: Internal Medicine

## 2022-06-12 DIAGNOSIS — R002 Palpitations: Secondary | ICD-10-CM

## 2022-06-12 DIAGNOSIS — I493 Ventricular premature depolarization: Secondary | ICD-10-CM

## 2022-06-17 ENCOUNTER — Ambulatory Visit: Payer: No Typology Code available for payment source | Attending: Internal Medicine

## 2022-06-17 DIAGNOSIS — R002 Palpitations: Secondary | ICD-10-CM

## 2022-06-17 DIAGNOSIS — I493 Ventricular premature depolarization: Secondary | ICD-10-CM

## 2022-06-17 NOTE — Telephone Encounter (Signed)
Electrolytes were OK in Oct 2023    I wuld recomm a 7 day monitor to document rhythms

## 2022-06-17 NOTE — Progress Notes (Unsigned)
Enrolled patient for a 7 day Zio XT monitor to be mailed to patients home.  

## 2022-06-20 DIAGNOSIS — R002 Palpitations: Secondary | ICD-10-CM | POA: Diagnosis not present

## 2022-06-20 DIAGNOSIS — I493 Ventricular premature depolarization: Secondary | ICD-10-CM

## 2022-06-24 ENCOUNTER — Telehealth: Payer: Self-pay | Admitting: Physician Assistant

## 2022-06-24 NOTE — Telephone Encounter (Signed)
Patient is requesting to transfer care from [facility] to [facility]. States wife would like entire family to have the same PCP. Is this okay with you?

## 2022-06-27 ENCOUNTER — Ambulatory Visit: Payer: 59 | Admitting: Family Medicine

## 2022-06-30 ENCOUNTER — Ambulatory Visit: Payer: 59 | Admitting: Physician Assistant

## 2022-07-02 ENCOUNTER — Ambulatory Visit (INDEPENDENT_AMBULATORY_CARE_PROVIDER_SITE_OTHER): Payer: 59 | Admitting: Physician Assistant

## 2022-07-02 ENCOUNTER — Encounter: Payer: Self-pay | Admitting: Physician Assistant

## 2022-07-02 VITALS — BP 124/80 | HR 76 | Temp 97.7°F | Ht 73.0 in | Wt 259.6 lb

## 2022-07-02 DIAGNOSIS — I493 Ventricular premature depolarization: Secondary | ICD-10-CM | POA: Diagnosis not present

## 2022-07-02 DIAGNOSIS — F411 Generalized anxiety disorder: Secondary | ICD-10-CM | POA: Diagnosis not present

## 2022-07-02 DIAGNOSIS — F410 Panic disorder [episodic paroxysmal anxiety] without agoraphobia: Secondary | ICD-10-CM | POA: Diagnosis not present

## 2022-07-02 DIAGNOSIS — E1169 Type 2 diabetes mellitus with other specified complication: Secondary | ICD-10-CM | POA: Diagnosis not present

## 2022-07-02 DIAGNOSIS — R002 Palpitations: Secondary | ICD-10-CM | POA: Diagnosis not present

## 2022-07-02 NOTE — Assessment & Plan Note (Signed)
PDMP reviewed today, no red flags, filling appropriately.  Alprazolam 0.5 mg prn.
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
## 2022-07-02 NOTE — Assessment & Plan Note (Signed)
50 lb weight loss in the last year per patient, excellent work. Mounjaro and food choices helping. Needs to incorporate exercise, encouraged him on this.

## 2022-07-02 NOTE — Assessment & Plan Note (Signed)
Lab Results  Component Value Date   HGBA1C 5.9 04/15/2022   Great improvement in the last year. Pt to continue Mounjaro 12.5 mg weekly. Cont to work on lifestyle changes. Will update eye exam this year.

## 2022-07-04 ENCOUNTER — Encounter: Payer: Self-pay | Admitting: Internal Medicine

## 2022-07-08 NOTE — Telephone Encounter (Signed)
Patient just wore heart monitor   SR   AVerage HR 87  Occsaional PACs  SHort burst (10 beats SVT) He wrote in should he double up on metoprolol   HE can do this if he feels skips a lot   ONe in morning, one at night  HE will have to see how he feels  IF dizzy, sluggish go back to 1x per day

## 2022-07-12 ENCOUNTER — Other Ambulatory Visit (HOSPITAL_COMMUNITY): Payer: Self-pay

## 2022-07-12 ENCOUNTER — Other Ambulatory Visit: Payer: Self-pay | Admitting: Physician Assistant

## 2022-07-14 ENCOUNTER — Other Ambulatory Visit (HOSPITAL_COMMUNITY): Payer: Self-pay

## 2022-07-14 ENCOUNTER — Other Ambulatory Visit: Payer: Self-pay

## 2022-07-14 MED ORDER — MOUNJARO 12.5 MG/0.5ML ~~LOC~~ SOAJ
12.5000 mg | SUBCUTANEOUS | 0 refills | Status: DC
  Filled 2022-07-14: qty 2, 28d supply, fill #0

## 2022-07-15 ENCOUNTER — Encounter: Payer: Self-pay | Admitting: Pharmacist

## 2022-07-15 ENCOUNTER — Other Ambulatory Visit: Payer: Self-pay

## 2022-07-15 ENCOUNTER — Other Ambulatory Visit (HOSPITAL_COMMUNITY): Payer: Self-pay

## 2022-07-22 ENCOUNTER — Other Ambulatory Visit (HOSPITAL_COMMUNITY): Payer: Self-pay

## 2022-07-22 DIAGNOSIS — Z3009 Encounter for other general counseling and advice on contraception: Secondary | ICD-10-CM | POA: Diagnosis not present

## 2022-07-22 MED ORDER — DIAZEPAM 10 MG PO TABS
10.0000 mg | ORAL_TABLET | ORAL | 0 refills | Status: DC
  Filled 2022-07-22: qty 2, 1d supply, fill #0
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
## 2022-07-29 ENCOUNTER — Ambulatory Visit: Payer: 59 | Admitting: Internal Medicine

## 2022-07-31 ENCOUNTER — Telehealth (HOSPITAL_BASED_OUTPATIENT_CLINIC_OR_DEPARTMENT_OTHER): Payer: Self-pay | Admitting: Pulmonary Disease

## 2022-07-31 DIAGNOSIS — G4733 Obstructive sleep apnea (adult) (pediatric): Secondary | ICD-10-CM

## 2022-08-01 NOTE — Telephone Encounter (Signed)
Please advise on patient request

## 2022-08-01 NOTE — Telephone Encounter (Signed)
Okay to send order for new Resmed auto CPAP 7 to 17 cm H2O.

## 2022-08-04 NOTE — Telephone Encounter (Signed)
Called and spoke with patient. Patient verbalized understanding. Order has been placed for new CPAP machine.   Nothing further needed.

## 2022-08-20 ENCOUNTER — Other Ambulatory Visit: Payer: Self-pay | Admitting: Internal Medicine

## 2022-08-20 ENCOUNTER — Other Ambulatory Visit (HOSPITAL_COMMUNITY): Payer: Self-pay

## 2022-08-20 ENCOUNTER — Other Ambulatory Visit: Payer: Self-pay | Admitting: Physician Assistant

## 2022-08-20 ENCOUNTER — Other Ambulatory Visit: Payer: Self-pay

## 2022-08-20 MED ORDER — ROSUVASTATIN CALCIUM 40 MG PO TABS
40.0000 mg | ORAL_TABLET | Freq: Every day | ORAL | 0 refills | Status: AC
  Filled 2022-08-20: qty 30, 30d supply, fill #0

## 2022-08-20 MED ORDER — METOPROLOL SUCCINATE ER 50 MG PO TB24
50.0000 mg | ORAL_TABLET | Freq: Every day | ORAL | 0 refills | Status: AC
  Filled 2022-08-20: qty 90, 90d supply, fill #0

## 2022-08-20 MED ORDER — MOUNJARO 12.5 MG/0.5ML ~~LOC~~ SOAJ
12.5000 mg | SUBCUTANEOUS | 0 refills | Status: DC
  Filled 2022-08-20: qty 2, 28d supply, fill #0

## 2022-08-21 ENCOUNTER — Other Ambulatory Visit (HOSPITAL_COMMUNITY): Payer: Self-pay

## 2022-08-21 ENCOUNTER — Other Ambulatory Visit: Payer: Self-pay

## 2022-08-21 DIAGNOSIS — Z302 Encounter for sterilization: Secondary | ICD-10-CM | POA: Diagnosis not present

## 2022-08-23 ENCOUNTER — Other Ambulatory Visit (HOSPITAL_COMMUNITY): Payer: Self-pay

## 2022-08-26 ENCOUNTER — Other Ambulatory Visit: Payer: Self-pay

## 2022-08-27 ENCOUNTER — Other Ambulatory Visit (HOSPITAL_COMMUNITY): Payer: Self-pay

## 2022-08-28 ENCOUNTER — Ambulatory Visit (HOSPITAL_BASED_OUTPATIENT_CLINIC_OR_DEPARTMENT_OTHER): Payer: 59 | Admitting: Pulmonary Disease

## 2022-09-01 ENCOUNTER — Telehealth: Payer: 59 | Admitting: Physician Assistant

## 2022-09-01 ENCOUNTER — Other Ambulatory Visit: Payer: Self-pay

## 2022-09-01 ENCOUNTER — Other Ambulatory Visit (HOSPITAL_COMMUNITY): Payer: Self-pay

## 2022-09-01 DIAGNOSIS — R197 Diarrhea, unspecified: Secondary | ICD-10-CM | POA: Diagnosis not present

## 2022-09-01 MED ORDER — ONDANSETRON HCL 4 MG PO TABS
4.0000 mg | ORAL_TABLET | Freq: Three times a day (TID) | ORAL | 0 refills | Status: DC | PRN
  Filled 2022-09-01: qty 20, 7d supply, fill #0

## 2022-09-01 NOTE — Progress Notes (Signed)
We are sorry that you are not feeling well.  Here is how we plan to help!  Based on what you have shared with me it looks like you have Acute Infectious Diarrhea.  Most cases of acute diarrhea are due to infections with virus and bacteria and are self-limited conditions lasting less than 14 days.  For your symptoms you may take Imodium 2 mg tablets that are over the counter at your local pharmacy. Take two tablet now and then one after each loose stool up to 6 a day.  Antibiotics are not needed for most people with diarrhea.  I have prescribed Zofran '4mg'$  Take 1 tablet every 8 hours as needed for nausea.  I would recommend to start the Imodium, but would contact your Primary care provider (or the provider that prescribes the [facility]) to advise them of the side effects you are having as well.  HOME CARE We recommend changing your diet to help with your symptoms for the next few days. Drink plenty of fluids that contain water salt and sugar. Sports drinks such as Gatorade may help.  You may try broths, soups, bananas, applesauce, soft breads, mashed potatoes or crackers.  You are considered infectious for as long as the diarrhea continues. Hand washing or use of alcohol based hand sanitizers is recommend. It is best to stay out of work or school until your symptoms stop.   GET HELP RIGHT AWAY If you have dark yellow colored urine or do not pass urine frequently you should drink more fluids.   If your symptoms worsen  If you feel like you are going to pass out (faint) You have a new problem  MAKE SURE YOU  Understand these instructions. Will watch your condition. Will get help right away if you are not doing well or get worse.  Thank you for choosing an e-visit.  Your e-visit answers were reviewed by a board certified advanced clinical practitioner to complete your personal care plan. Depending upon the condition, your plan could have included both over the counter or prescription  medications.  Please review your pharmacy choice. Make sure the pharmacy is open so you can pick up prescription now. If there is a problem, you may contact your provider through [employer] and have the prescription routed to another pharmacy.  Your safety is important to [place]. If you have drug allergies check your prescription carefully.   For the next 24 hours you can use MyChart to ask questions about today's visit, request a non-urgent call back, or ask for a work or school excuse. You will get an email in the next two days asking about your experience. I hope that your e-visit has been valuable and will speed your recovery.  I have spent 5 minutes in review of e-visit questionnaire, review and updating patient chart, medical decision making and response to patient.   Mar Daring, PA-C

## 2022-09-02 ENCOUNTER — Ambulatory Visit (INDEPENDENT_AMBULATORY_CARE_PROVIDER_SITE_OTHER): Payer: 59 | Admitting: Physician Assistant

## 2022-09-02 ENCOUNTER — Encounter: Payer: Self-pay | Admitting: Physician Assistant

## 2022-09-02 VITALS — BP 110/68 | HR 88 | Temp 97.8°F | Ht 73.0 in | Wt 247.0 lb

## 2022-09-02 DIAGNOSIS — R197 Diarrhea, unspecified: Secondary | ICD-10-CM | POA: Diagnosis not present

## 2022-09-02 DIAGNOSIS — R1013 Epigastric pain: Secondary | ICD-10-CM

## 2022-09-02 LAB — COMPREHENSIVE METABOLIC PANEL
ALT: 19 U/L (ref 0–53)
AST: 15 U/L (ref 0–37)
Albumin: 4.5 g/dL (ref 3.5–5.2)
Alkaline Phosphatase: 79 U/L (ref 39–117)
BUN: 34 mg/dL — ABNORMAL HIGH (ref 6–23)
CO2: 21 mEq/L (ref 19–32)
Calcium: 10.2 mg/dL (ref 8.4–10.5)
Chloride: 101 mEq/L (ref 96–112)
Creatinine, Ser: 1.73 mg/dL — ABNORMAL HIGH (ref 0.40–1.50)
GFR: 46.46 mL/min — ABNORMAL LOW (ref >60.00)
Glucose, Bld: 96 mg/dL (ref 70–99)
Potassium: 3.4 mEq/L — ABNORMAL LOW (ref 3.5–5.1)
Sodium: 134 mEq/L — ABNORMAL LOW (ref 135–145)
Total Bilirubin: 0.6 mg/dL (ref 0.2–1.2)
Total Protein: 8.1 g/dL (ref 6.0–8.3)

## 2022-09-02 LAB — CBC WITH DIFFERENTIAL/PLATELET
Basophils Absolute: 0 K/uL (ref 0.0–0.1)
Basophils Relative: 0.3 % (ref 0.0–3.0)
Eosinophils Absolute: 0.1 K/uL (ref 0.0–0.7)
Eosinophils Relative: 0.8 % (ref 0.0–5.0)
HCT: 51.1 % (ref 39.0–52.0)
Hemoglobin: 17.3 g/dL — ABNORMAL HIGH (ref 13.0–17.0)
Lymphocytes Relative: 12.4 % (ref 12.0–46.0)
Lymphs Abs: 1.2 K/uL (ref 0.7–4.0)
MCHC: 33.8 g/dL (ref 30.0–36.0)
MCV: 85.7 fl (ref 78.0–100.0)
Monocytes Absolute: 0.8 K/uL (ref 0.1–1.0)
Monocytes Relative: 9 % (ref 3.0–12.0)
Neutro Abs: 7.3 K/uL (ref 1.4–7.7)
Neutrophils Relative %: 77.5 % — ABNORMAL HIGH (ref 43.0–77.0)
Platelets: 318 K/uL (ref 150.0–400.0)
RBC: 5.96 Mil/uL — ABNORMAL HIGH (ref 4.22–5.81)
RDW: 13.8 % (ref 11.5–15.5)
WBC: 9.5 K/uL (ref 4.0–10.5)

## 2022-09-02 LAB — LIPASE: Lipase: 8 U/L — ABNORMAL LOW (ref 11.0–59.0)

## 2022-09-02 LAB — H. PYLORI ANTIBODY, IGG: H Pylori IgG: NEGATIVE

## 2022-09-02 NOTE — Patient Instructions (Signed)
It was great to see you!  Hold mounjaro  Push fluids  We will check blood work today and be in touch with results  Start prilosec to help reduce acid in your stomach  IF sudden onset abdominal pain, fever, uncontrolled vomiting/diarrhea, inability to keep fluids down, go to the ER  Take care,  Inda Coke PA-C
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
## 2022-09-04 ENCOUNTER — Other Ambulatory Visit: Payer: Self-pay

## 2022-09-05 ENCOUNTER — Other Ambulatory Visit: Payer: Self-pay

## 2022-09-09 ENCOUNTER — Other Ambulatory Visit: Payer: Self-pay | Admitting: Physician Assistant

## 2022-09-10 ENCOUNTER — Other Ambulatory Visit: Payer: Self-pay

## 2022-09-10 MED ORDER — MOUNJARO 12.5 MG/0.5ML ~~LOC~~ SOAJ
12.5000 mg | SUBCUTANEOUS | 0 refills | Status: DC
  Filled 2022-09-10: qty 2, 28d supply, fill #0

## 2022-09-11 ENCOUNTER — Other Ambulatory Visit: Payer: Self-pay

## 2022-09-11 ENCOUNTER — Other Ambulatory Visit (HOSPITAL_COMMUNITY): Payer: Self-pay

## 2022-09-12 ENCOUNTER — Other Ambulatory Visit (HOSPITAL_COMMUNITY): Payer: Self-pay

## 2022-09-18 ENCOUNTER — Other Ambulatory Visit (HOSPITAL_COMMUNITY): Payer: Self-pay

## 2022-10-02 ENCOUNTER — Other Ambulatory Visit (HOSPITAL_COMMUNITY): Payer: Self-pay

## 2022-10-06 ENCOUNTER — Ambulatory Visit (INDEPENDENT_AMBULATORY_CARE_PROVIDER_SITE_OTHER): Payer: 59 | Admitting: Physician Assistant

## 2022-10-06 ENCOUNTER — Encounter: Payer: Self-pay | Admitting: Physician Assistant

## 2022-10-06 VITALS — BP 130/84 | HR 94 | Temp 97.8°F | Ht 73.0 in | Wt 253.4 lb

## 2022-10-06 DIAGNOSIS — E1169 Type 2 diabetes mellitus with other specified complication: Secondary | ICD-10-CM

## 2022-10-06 LAB — POCT GLYCOSYLATED HEMOGLOBIN (HGB A1C): Hemoglobin A1C: 5.5 % (ref 4.0–5.6)

## 2022-10-06 NOTE — Assessment & Plan Note (Signed)
Lab Results  Component Value Date   HGBA1C 5.5 10/06/2022   Continues to improve greatly! Had severe SE's with Mounjaro injection last month where he placed shot in his arm instead of abdomen, has done abdomen injections since and seems better. Will cont 12.5 mg weekly. Consider dose reduction if any recurrent issues. Pt to let me know. He will contact eye doctor to scheduled updated exam.
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
## 2022-10-28 ENCOUNTER — Encounter: Payer: Self-pay | Admitting: Physician Assistant

## 2022-10-29 ENCOUNTER — Other Ambulatory Visit (HOSPITAL_COMMUNITY): Payer: Self-pay

## 2022-10-29 ENCOUNTER — Other Ambulatory Visit: Payer: Self-pay

## 2022-10-29 ENCOUNTER — Telehealth: Payer: Self-pay

## 2022-10-29 DIAGNOSIS — E1169 Type 2 diabetes mellitus with other specified complication: Secondary | ICD-10-CM

## 2022-10-29 MED ORDER — TIRZEPATIDE 10 MG/0.5ML ~~LOC~~ SOAJ
10.0000 mg | SUBCUTANEOUS | 0 refills | Status: DC
  Filled 2022-10-29: qty 2, 28d supply, fill #0

## 2022-10-29 NOTE — Telephone Encounter (Signed)
Please see pt msg and advise on dosage and refill, thanks

## 2022-10-29 NOTE — Telephone Encounter (Signed)
Pt requesting prior auth, showing in Cover my Meds  Maximilliano Walter Key: [id number]  Please advise

## 2022-10-31 ENCOUNTER — Other Ambulatory Visit: Payer: Self-pay | Admitting: Internal Medicine

## 2022-10-31 DIAGNOSIS — F410 Panic disorder [episodic paroxysmal anxiety] without agoraphobia: Secondary | ICD-10-CM

## 2022-11-01 ENCOUNTER — Other Ambulatory Visit (HOSPITAL_COMMUNITY): Payer: Self-pay

## 2022-11-03 ENCOUNTER — Other Ambulatory Visit: Payer: Self-pay

## 2022-11-04 ENCOUNTER — Telehealth: Payer: Self-pay

## 2022-11-04 ENCOUNTER — Telehealth: Payer: Self-pay | Admitting: Pulmonary Disease

## 2022-11-04 ENCOUNTER — Other Ambulatory Visit (HOSPITAL_COMMUNITY): Payer: Self-pay

## 2022-11-04 MED ORDER — ALPRAZOLAM 0.5 MG PO TABS
0.5000 mg | ORAL_TABLET | Freq: Every day | ORAL | 0 refills | Status: AC | PRN
  Filled 2022-11-04: qty 30, 30d supply, fill #0

## 2022-11-04 NOTE — Telephone Encounter (Signed)
Called pharmacy and Mounjaro 10mg is on backorder. Called pt and advised if he finds at another pharmacy he can let us know and we can send in for him to be filled. Pt verbalized understanding     

## 2022-11-04 NOTE — Telephone Encounter (Signed)
Closing encounter

## 2022-11-04 NOTE — Telephone Encounter (Signed)
Please advise in patient dosage. Out of stock at all pharmacies not sure that 7.5mg  is going to be a difference.

## 2022-11-04 NOTE — Telephone Encounter (Signed)
Called pharmacy and [facility] 10mg  is on backorder. Called pt and advised if he finds at another pharmacy he can let us know and we can send in for him to be filled. Pt verbalized understanding

## 2022-11-04 NOTE — Telephone Encounter (Signed)
CPAP order will need to be sent to another company [facility] no longer does cpap

## 2022-11-04 NOTE — Telephone Encounter (Signed)
Pharmacy Patient Advocate Encounter   Received notification that prior authorization for Mounjaro 10mg /0.37ml is required/requested.   PA submitted on 11/04/22 to (ins) [employer] via CoverMyMeds Key # [id number] Status is member should be able to get the drug/product without a PA at this time  Per test claim, max 30 days supply for first fill, $25.00 copay for 28 days with [facility].

## 2022-11-05 ENCOUNTER — Other Ambulatory Visit: Payer: Self-pay

## 2022-11-05 DIAGNOSIS — E1169 Type 2 diabetes mellitus with other specified complication: Secondary | ICD-10-CM

## 2022-11-05 MED ORDER — TIRZEPATIDE 10 MG/0.5ML ~~LOC~~ SOAJ: 10.0000 mg | SUBCUTANEOUS | 0 refills | Status: DC

## 2022-11-26 ENCOUNTER — Ambulatory Visit: Payer: 59 | Admitting: Physician Assistant

## 2022-12-01 ENCOUNTER — Telehealth: Payer: Self-pay | Admitting: Pulmonary Disease

## 2022-12-01 NOTE — Telephone Encounter (Signed)
Pt calling in bc he was needs his Cpap sent to another location

## 2022-12-03 NOTE — Telephone Encounter (Signed)
PT ret Bonnie's call. Please try again @ [phone]

## 2022-12-03 NOTE — Telephone Encounter (Signed)
ATC X1 LVM for patient to call our office back regarding prior message.

## 2022-12-04 NOTE — Telephone Encounter (Signed)
I have sent the order to [facility].  Left detailed message on pt's VM & provided their phone# [phone].  Nothing further needed at this time.

## 2022-12-04 NOTE — Telephone Encounter (Signed)
Spoke with the pt  He states he wants CPAP order sent to another DME besides [facility]  He says that he called them and they told him they "do not do CPAP's anymore"  I assured him that this is not true  He disagreed and still wants order sent to another DME   PCC's- please sent order elsewhere and please call and tell the pt where you send it to so he can f/u with them if needed. Thanks!

## 2022-12-04 NOTE — Telephone Encounter (Signed)
Part of the problem is that the patient hasn't been seen since 04/26/2021 and will need to have an office visit showing he is complaint with using current Cpap Machine.

## 2022-12-05 ENCOUNTER — Telehealth: Payer: Self-pay

## 2022-12-05 NOTE — Telephone Encounter (Signed)
On 12/01/22 - patient left a telephone message & asked to switch DME providers for his cpap supplies from [facility] to someone else.  Unfortunately, the patient hasn't been seen since 04/26/2021 & will need an office visit.  I have left a VM for pt to call & schedule the appointment.

## 2022-12-05 NOTE — Telephone Encounter (Signed)
He hasn't been seen since November 2022.  He needs an ROV before we can sign order.  ROV can by with me or an NP.

## 2022-12-05 NOTE — Telephone Encounter (Signed)
Spoke to patient. Scheduled 01/13/2023 with Ames Dura NP.

## 2022-12-05 NOTE — Telephone Encounter (Signed)
Dr. Craige Cotta please advise I spoke with patient, he is wanting to get set up with a new DME- he is a former [facility] patient. I advised he would need a current office visit since its almost been two years. He stated he was told differently and a order could be sent.

## 2022-12-08 NOTE — Telephone Encounter (Signed)
Closing encounter since apt has been scheduled. NFN

## 2022-12-09 ENCOUNTER — Other Ambulatory Visit (HOSPITAL_COMMUNITY): Payer: Self-pay

## 2022-12-09 ENCOUNTER — Other Ambulatory Visit: Payer: Self-pay | Admitting: Physician Assistant

## 2022-12-09 DIAGNOSIS — E1169 Type 2 diabetes mellitus with other specified complication: Secondary | ICD-10-CM

## 2022-12-09 MED ORDER — TIRZEPATIDE 10 MG/0.5ML ~~LOC~~ SOAJ
10.0000 mg | SUBCUTANEOUS | 0 refills | Status: AC
  Filled 2022-12-09: qty 2, 28d supply, fill #0

## 2022-12-10 LAB — HM DIABETES EYE EXAM

## 2022-12-17 ENCOUNTER — Encounter: Payer: Self-pay | Admitting: Physician Assistant

## 2022-12-19 ENCOUNTER — Encounter (HOSPITAL_BASED_OUTPATIENT_CLINIC_OR_DEPARTMENT_OTHER): Payer: Self-pay

## 2022-12-19 ENCOUNTER — Other Ambulatory Visit (HOSPITAL_BASED_OUTPATIENT_CLINIC_OR_DEPARTMENT_OTHER): Payer: Self-pay

## 2022-12-19 ENCOUNTER — Other Ambulatory Visit: Payer: Self-pay | Admitting: Physician Assistant

## 2022-12-19 MED ORDER — TIRZEPATIDE 12.5 MG/0.5ML ~~LOC~~ SOAJ
12.5000 mg | SUBCUTANEOUS | 0 refills | Status: AC
  Filled 2022-12-19: qty 2, 28d supply, fill #0
  Filled 2023-01-22: qty 2, 28d supply, fill #1
  Filled 2023-03-07 – 2023-03-20 (×2): qty 2, 28d supply, fill #2

## 2022-12-20 ENCOUNTER — Other Ambulatory Visit (HOSPITAL_COMMUNITY): Payer: Self-pay

## 2022-12-22 ENCOUNTER — Other Ambulatory Visit (HOSPITAL_BASED_OUTPATIENT_CLINIC_OR_DEPARTMENT_OTHER): Payer: Self-pay

## 2022-12-23 ENCOUNTER — Encounter: Payer: Self-pay | Admitting: Physician Assistant

## 2022-12-25 ENCOUNTER — Other Ambulatory Visit (HOSPITAL_COMMUNITY): Payer: Self-pay

## 2022-12-25 ENCOUNTER — Telehealth: Payer: Self-pay

## 2022-12-25 NOTE — Telephone Encounter (Signed)
Pharmacy Patient Advocate Encounter  Received notification from [employer] that Prior Authorization for Mounjaro 12.5MG /0.5ML pen-injectors has been APPROVED from 11/25/2022 to 12/26/2023.Marland Kitchen  PA #/Case ID/Reference #: [id number] KEY: [id number]

## 2022-12-29 NOTE — Telephone Encounter (Signed)
Please see pt msg and advise on PA

## 2022-12-30 ENCOUNTER — Other Ambulatory Visit (HOSPITAL_BASED_OUTPATIENT_CLINIC_OR_DEPARTMENT_OTHER): Payer: Self-pay

## 2023-01-01 ENCOUNTER — Other Ambulatory Visit: Payer: Self-pay | Admitting: Oncology

## 2023-01-01 ENCOUNTER — Other Ambulatory Visit (HOSPITAL_COMMUNITY): Payer: Self-pay

## 2023-01-01 DIAGNOSIS — Z006 Encounter for examination for normal comparison and control in clinical research program: Secondary | ICD-10-CM

## 2023-01-13 ENCOUNTER — Ambulatory Visit: Payer: Managed Care, Other (non HMO) | Admitting: Primary Care

## 2023-01-13 ENCOUNTER — Encounter: Payer: Self-pay | Admitting: Primary Care

## 2023-01-13 VITALS — BP 128/80 | HR 89 | Temp 98.3°F | Ht 73.0 in | Wt 263.4 lb

## 2023-01-13 DIAGNOSIS — G4733 Obstructive sleep apnea (adult) (pediatric): Secondary | ICD-10-CM | POA: Diagnosis not present

## 2023-01-13 NOTE — Progress Notes (Signed)
Reviewed and agree with assessment/plan.   Coralyn Helling, MD [facility] Pulmonary/Critical Care 01/13/2023, 1:32 PM Pager:  [phone]

## 2023-01-13 NOTE — Assessment & Plan Note (Signed)
-   HST in January 2012 showed severe obstructive sleep apnea, average AHI 43/hour - Patient is 93% compliant with CPAP use greater than 4 hours over the last 30 days - Current pressure 7-17 cm H2O; Residual AHI 0.4/hour  - DME ordered placed for new CPAP machine at current pressure settings - Advised patient continue to wear CPAP nightly for 4 to 6 hours or longer.  Focus on side sleeping position and weight loss efforts. - Follow-up 31 to 90 days after receiving new CPAP for compliance check

## 2023-01-13 NOTE — Patient Instructions (Signed)
-   Sleep apnea is well-controlled on current pressure setting 7-17cm h20  - No changes today, continue to wear CPAP nightly for 4 to 6 hours or longer - We have placed an order for you to receive new CPAP machine, if you have not heard from medical supply store in 1 to 2 weeks please reach out to [place] by MyChart  Follow-up You will need a follow-up 31 to 90 days after receiving new CPAP for compliance check, this can be virtual.  Please call to set up once you have received new CPAP
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
## 2023-01-22 ENCOUNTER — Other Ambulatory Visit: Payer: Self-pay

## 2023-01-22 ENCOUNTER — Other Ambulatory Visit: Payer: Self-pay | Admitting: Internal Medicine

## 2023-01-23 ENCOUNTER — Other Ambulatory Visit (HOSPITAL_COMMUNITY): Payer: Self-pay

## 2023-01-23 ENCOUNTER — Other Ambulatory Visit: Payer: Self-pay

## 2023-01-23 ENCOUNTER — Encounter: Payer: Self-pay | Admitting: Pharmacist

## 2023-01-23 MED ORDER — REPATHA SURECLICK 140 MG/ML ~~LOC~~ SOAJ
SUBCUTANEOUS | 0 refills | Status: AC
  Filled 2023-01-23: qty 2, 28d supply, fill #0
  Filled 2023-03-07: qty 2, 28d supply, fill #1

## 2023-01-27 ENCOUNTER — Other Ambulatory Visit: Payer: Self-pay

## 2023-02-12 ENCOUNTER — Ambulatory Visit: Payer: 59 | Admitting: Physician Assistant

## 2023-02-17 ENCOUNTER — Ambulatory Visit: Payer: 59 | Admitting: Physician Assistant

## 2023-03-07 ENCOUNTER — Other Ambulatory Visit (HOSPITAL_COMMUNITY): Payer: Self-pay

## 2023-03-18 ENCOUNTER — Ambulatory Visit: Payer: 59 | Admitting: Physician Assistant

## 2023-03-20 ENCOUNTER — Other Ambulatory Visit: Payer: Self-pay

## 2023-03-20 ENCOUNTER — Other Ambulatory Visit (HOSPITAL_COMMUNITY): Payer: Self-pay

## 2023-03-23 ENCOUNTER — Other Ambulatory Visit (HOSPITAL_COMMUNITY): Payer: Self-pay

## 2023-05-12 ENCOUNTER — Other Ambulatory Visit: Payer: Self-pay

## 2024-03-15 ENCOUNTER — Emergency Department (HOSPITAL_COMMUNITY)
Admission: EM | Admit: 2024-03-15 | Discharge: 2024-03-16 | Discharge status: Against Medical Advice | Attending: Emergency Medicine | Admitting: Emergency Medicine

## 2024-03-15 ENCOUNTER — Other Ambulatory Visit: Payer: Self-pay

## 2024-03-15 ENCOUNTER — Emergency Department (HOSPITAL_COMMUNITY)

## 2024-03-15 ENCOUNTER — Encounter (HOSPITAL_COMMUNITY): Payer: Self-pay

## 2024-03-15 DIAGNOSIS — R61 Generalized hyperhidrosis: Secondary | ICD-10-CM | POA: Insufficient documentation

## 2024-03-15 DIAGNOSIS — R079 Chest pain, unspecified: Secondary | ICD-10-CM | POA: Insufficient documentation

## 2024-03-15 DIAGNOSIS — I2510 Atherosclerotic heart disease of native coronary artery without angina pectoris: Secondary | ICD-10-CM | POA: Insufficient documentation

## 2024-03-15 DIAGNOSIS — Z5321 Procedure and treatment not carried out due to patient leaving prior to being seen by health care provider: Secondary | ICD-10-CM | POA: Insufficient documentation

## 2024-03-15 LAB — CBC
HCT: 44.7 % (ref 39.0–52.0)
Hemoglobin: 14.6 g/dL (ref 13.0–17.0)
MCH: 28.8 pg (ref 26.0–34.0)
MCHC: 32.7 g/dL (ref 30.0–36.0)
MCV: 88.2 fL (ref 80.0–100.0)
Platelets: 310 K/uL (ref 150–400)
RBC: 5.07 MIL/uL (ref 4.22–5.81)
RDW: 13.6 % (ref 11.5–15.5)
WBC: 8.1 K/uL (ref 4.0–10.5)
nRBC: 0 % (ref 0.0–0.2)

## 2024-03-15 LAB — BASIC METABOLIC PANEL WITH GFR
Anion gap: 11 (ref 5–15)
BUN: 17 mg/dL (ref 6–20)
CO2: 20 mmol/L — ABNORMAL LOW (ref 22–32)
Calcium: 8.9 mg/dL (ref 8.9–10.3)
Chloride: 107 mmol/L (ref 98–111)
Creatinine, Ser: 1.35 mg/dL — ABNORMAL HIGH (ref 0.61–1.24)
GFR, Estimated: >60 mL/min (ref >60)
Glucose, Bld: 119 mg/dL — ABNORMAL HIGH (ref 70–99)
Potassium: 3.5 mmol/L (ref 3.5–5.1)
Sodium: 138 mmol/L (ref 135–145)

## 2024-03-15 LAB — TROPONIN I (HIGH SENSITIVITY): Troponin I (High Sensitivity): 4 ng/L (ref <18)

## 2024-03-15 NOTE — ED Triage Notes (Signed)
 Pt in with L arm pain that began yesterday, now radiates into L chest. No sob, but feels very fatigued

## 2024-03-15 NOTE — ED Triage Notes (Signed)
 Pt also c/o cold sensation in both thighs.

## 2024-03-15 NOTE — ED Provider Triage Note (Signed)
 Emergency Medicine Provider Triage Evaluation Note  Howard Hays , a 48 y.o. male  was evaluated in triage.  Patient has a history of an anomalous right coronary artery.  Pt complains of chest pain described as burning with radiation into the left arm that started last evening.  Symptoms began at rest.  He has had some sweating at times, no vomiting.  Denies associated shortness of breath.  He has had normal activity recently.  Reports stress echo done in the past 1 year at the [facility].  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 Common origin of the coronary arteries.   The proximal RCA takes an acute angle anterior to the aorta, between the great vessels, and appears at least 50% compressed for an approximately 2.5 cm segment.  Review of Systems  Positive: Chest pain, arm pain Negative: Shortness of breath  Physical Exam  BP 138/82 (BP Location: Right Arm)   Pulse 84   Temp 98.7 F (37.1 C)   Resp (!) 24   Ht 6' 1.5 (1.867 m)   Wt 124.7 kg   SpO2 94%   BMI 35.79 kg/m  Gen:   Awake, no distress   Resp:  Normal effort  MSK:   Moves extremities without difficulty  Other:  Heart rhythm regular, no murmur  Medical Decision Making  Medically screening exam initiated at 8:04 PM.  Appropriate orders placed.  Delynn Satre was informed that the remainder of the evaluation will be completed by another provider, this initial triage assessment does not replace that evaluation, and the importance of remaining in the ED until their evaluation is complete.     Desiderio Chew, PA-C 03/15/24 2007

## 2024-03-16 LAB — TROPONIN I (HIGH SENSITIVITY): Troponin I (High Sensitivity): 4 ng/L (ref <18)

## 2024-03-16 NOTE — ED Notes (Signed)
 Pt came to registration desk, states he does not want to wait any longer and wishes to go home. Advised of risks of leaving, pt left out of lobby.

## 2024-04-12 ENCOUNTER — Other Ambulatory Visit: Payer: Self-pay | Admitting: Medical Genetics

## 2024-04-12 DIAGNOSIS — Z006 Encounter for examination for normal comparison and control in clinical research program: Secondary | ICD-10-CM

## 2024-06-07 LAB — GENECONNECT MOLECULAR SCREEN: Genetic Analysis Overall Interpretation: NEGATIVE
